# Patient Record
Sex: Female | Born: 1975
Health system: Southern US, Community
[De-identification: ages and names within clinical notes are randomized; demographics above are authoritative.]

## PROBLEM LIST (undated history)

## (undated) DIAGNOSIS — G43909 Migraine, unspecified, not intractable, without status migrainosus: Secondary | ICD-10-CM

## (undated) DIAGNOSIS — F329 Major depressive disorder, single episode, unspecified: Secondary | ICD-10-CM

## (undated) DIAGNOSIS — F419 Anxiety disorder, unspecified: Secondary | ICD-10-CM

## (undated) DIAGNOSIS — E119 Type 2 diabetes mellitus without complications: Secondary | ICD-10-CM

## (undated) DIAGNOSIS — R109 Unspecified abdominal pain: Secondary | ICD-10-CM

## (undated) DIAGNOSIS — E282 Polycystic ovarian syndrome: Secondary | ICD-10-CM

## (undated) DIAGNOSIS — A6920 Lyme disease, unspecified: Secondary | ICD-10-CM

## (undated) DIAGNOSIS — F32A Depression, unspecified: Secondary | ICD-10-CM

## (undated) HISTORY — DX: Lyme disease, unspecified: A69.20

## (undated) HISTORY — PX: HYSTERECTOMY ABDOMINAL WITH SALPINGECTOMY: SHX6725

## (undated) HISTORY — DX: Type 2 diabetes mellitus without complications: E11.9

## (undated) HISTORY — DX: Polycystic ovarian syndrome: E28.2

## (undated) HISTORY — PX: WISDOM TOOTH EXTRACTION: SHX21

## (undated) HISTORY — DX: Anxiety disorder, unspecified: F41.9

---

## 2011-07-18 ENCOUNTER — Emergency Department (HOSPITAL_COMMUNITY): Payer: BC Managed Care – PPO

## 2011-07-18 ENCOUNTER — Encounter (HOSPITAL_COMMUNITY): Payer: Self-pay | Admitting: *Deleted

## 2011-07-18 ENCOUNTER — Emergency Department (HOSPITAL_COMMUNITY)
Admission: EM | Admit: 2011-07-18 | Discharge: 2011-07-18 | Disposition: A | Discharge status: Home / Self Care | Payer: BC Managed Care – PPO | Attending: Emergency Medicine | Admitting: Emergency Medicine

## 2011-07-18 DIAGNOSIS — R0789 Other chest pain: Secondary | ICD-10-CM | POA: Insufficient documentation

## 2011-07-18 LAB — CBC
MCV: 82.2 fL (ref 78.0–100.0)
Platelets: 241 10*3/uL (ref 150–400)
RDW: 13.3 % (ref 11.5–15.5)
WBC: 8.3 10*3/uL (ref 4.0–10.5)

## 2011-07-18 LAB — COMPREHENSIVE METABOLIC PANEL
ALT: 11 U/L (ref 0–35)
AST: 17 U/L (ref 0–37)
Alkaline Phosphatase: 75 U/L (ref 39–117)
Calcium: 9.3 mg/dL (ref 8.4–10.5)
Potassium: 3.9 mEq/L (ref 3.5–5.1)
Sodium: 138 mEq/L (ref 135–145)
Total Protein: 7.4 g/dL (ref 6.0–8.3)

## 2011-07-18 LAB — DIFFERENTIAL
Basophils Absolute: 0 10*3/uL (ref 0.0–0.1)
Eosinophils Absolute: 0.2 10*3/uL (ref 0.0–0.7)
Eosinophils Relative: 3 % (ref 0–5)
Lymphocytes Relative: 23 % (ref 12–46)
Neutrophils Relative %: 70 % (ref 43–77)

## 2011-07-18 MED ORDER — KETOROLAC TROMETHAMINE 60 MG/2ML IM SOLN
60.0000 mg | Freq: Once | INTRAMUSCULAR | Status: AC
  Administered 2011-07-18: 60 mg via INTRAMUSCULAR
  Filled 2011-07-18: qty 2

## 2011-07-18 MED ORDER — OXYCODONE-ACETAMINOPHEN 5-325 MG PO TABS: 1.0000 | ORAL_TABLET | ORAL | Status: AC | PRN

## 2011-07-18 NOTE — ED Notes (Signed)
Lt sided chest pain with lt arm pain since yesterday.  No other symptoms

## 2011-07-18 NOTE — Discharge Instructions (Signed)
Continue taking NSAIDS every 6-8 hours.  Follow up with your providers as dicussed in the ED today and as written above.  See your doctor immediately--or return to the ED--with any new or troubling symptoms including fevers, weakness, new chest pain, shortness or breath, numbness, or any other concerning symptom.   Chest Pain (Nonspecific) It is often hard to give a specific diagnosis for the cause of chest pain. There is always a chance that your pain could be related to something serious, such as a heart attack or a blood clot in the lungs. You need to follow up with your caregiver for further evaluation. CAUSES   Heartburn.   Pneumonia or bronchitis.   Anxiety or stress.   Inflammation around your heart (pericarditis) or lung (pleuritis or pleurisy).   A blood clot in the lung.   A collapsed lung (pneumothorax). It can develop suddenly on its own (spontaneous pneumothorax) or from injury (trauma) to the chest.   Shingles infection (herpes zoster virus).  The chest wall is composed of bones, muscles, and cartilage. Any of these can be the source of the pain.  The bones can be bruised by injury.   The muscles or cartilage can be strained by coughing or overwork.   The cartilage can be affected by inflammation and become sore (costochondritis).  DIAGNOSIS  Lab tests or other studies, such as X-rays, electrocardiography, stress testing, or cardiac imaging, may be needed to find the cause of your pain.  TREATMENT   Treatment depends on what may be causing your chest pain. Treatment may include:   Acid blockers for heartburn.   Anti-inflammatory medicine.   Pain medicine for inflammatory conditions.   Antibiotics if an infection is present.   You may be advised to change lifestyle habits. This includes stopping smoking and avoiding alcohol, caffeine, and chocolate.   You may be advised to keep your head raised (elevated) when sleeping. This reduces the chance of acid going  backward from your stomach into your esophagus.   Most of the time, nonspecific chest pain will improve within 2 to 3 days with rest and mild pain medicine.  HOME CARE INSTRUCTIONS   If antibiotics were prescribed, take your antibiotics as directed. Finish them even if you start to feel better.   For the next few days, avoid physical activities that bring on chest pain. Continue physical activities as directed.   Do not smoke.   Avoid drinking alcohol.   Only take over-the-counter or prescription medicine for pain, discomfort, or fever as directed by your caregiver.   Follow your caregiver's suggestions for further testing if your chest pain does not go away.   Keep any follow-up appointments you made. If you do not go to an appointment, you could develop lasting (chronic) problems with pain. If there is any problem keeping an appointment, you must call to reschedule.  SEEK MEDICAL CARE IF:   You think you are having problems from the medicine you are taking. Read your medicine instructions carefully.   Your chest pain does not go away, even after treatment.   You develop a rash with blisters on your chest.  SEEK IMMEDIATE MEDICAL CARE IF:   You have increased chest pain or pain that spreads to your arm, neck, jaw, back, or abdomen.   You develop shortness of breath, an increasing cough, or you are coughing up blood.   You have severe back or abdominal pain, feel nauseous, or vomit.   You develop severe weakness, fainting,  or chills.   You have a fever.  THIS IS AN EMERGENCY. Do not wait to see if the pain will go away. Get medical help at once. Call your local emergency services (911 in [place] Do not drive yourself to the hospital. MAKE SURE YOU:   Understand these instructions.   Will watch your condition.   Will get help right away if you are not doing well or get worse.  Document Released: 11/07/2004 Document Revised: 01/17/2011 Document Reviewed: 09/03/2007 [facility]  Patient Information 2012 [place], [place].

## 2011-07-18 NOTE — ED Provider Notes (Signed)
History     CSN: [id number]  Arrival date & time 07/18/11  1706   First MD Initiated Contact with Patient 07/18/11 1802      Chief Complaint  Patient presents with  . Chest Pain    (Consider location/radiation/quality/duration/timing/severity/associated sxs/prior treatment) HPI  Patient is a 36 year old female with past medical history of depression on fluoxetine and also on Ortho-Cyclen presents today with a less than 24-hour history of sharp, worse with movment and deep inspiration, 2-10/10,  left-sided chest pain with some mild radiation to the shoulder without any other features concerning for ACS. Denies shortness of breath,  jaw pain, arm pain,  diaphoresis or dyspnea with exertion. She did endorse is a recent upper respiratory infection with right-sided sharp chest pain which was worse with deep inspiration and movement. Similarly, her complaint today is pain that is exacerbated by movement of her left shoulder with deep inspiration. She also does endorse tenderness to palpation of the left anterior chest. She denies any recent fever chills nausea vomiting diarrhea. She also denies any family HX of early ACS,  She takes no aspirin. Denies DVT, PE, clotting problems. On arrival patient's temperature 97.5 pulse 80, respirations 20, blood pressure 112/66, saturations are 100% on room air.   History reviewed. No pertinent past medical history.  History reviewed. No pertinent past surgical history.  No family history on file.  History  Substance Use Topics  . Smoking status: Current Everyday Smoker  . Smokeless tobacco: Not on file  . Alcohol Use: Yes    OB History    Grav Para Term Preterm Abortions TAB SAB Ect Mult Living                  Review of Systems Constitutional: Negative for fever and chills.  HENT: Negative for ear pain, sore throat and trouble swallowing.   Eyes: Negative for pain and visual disturbance.  Respiratory: Negative for cough and shortness of  breath.   Cardiovascular: POS for chest pain and neg leg swelling.  Gastrointestinal: Negative for nausea, vomiting, abdominal pain and diarrhea.  Genitourinary: Negative for dysuria, urgency and frequency.  Musculoskeletal: Negative for back pain and joint swelling.  Skin: Negative for rash and wound.  Neurological: Negative for dizziness, syncope, speech difficulty, weakness and numbness.     Allergies  Penicillins; Vicodin; and Sulfa antibiotics  Home Medications   Current Outpatient Rx  Name Route Sig Dispense Refill  . FLUOXETINE HCL 40 MG PO CAPS Oral Take 40 mg by mouth daily.    Marland Kitchen NORGESTIMATE-ETH ESTRADIOL 0.25-35 MG-MCG PO TABS Oral Take 1 tablet by mouth at bedtime.    . OXYCODONE-ACETAMINOPHEN 5-325 MG PO TABS Oral Take 1 tablet by mouth every 4 (four) hours as needed for pain. 5 tablet 0    BP 125/80  Pulse 92  Temp(Src) 98.6 F (37 C) (Oral)  Resp 18  SpO2 99%  LMP 06/17/2011  Physical Exam Consitutional: Pt in no acute distress.   Head: Normocephalic and atraumatic.  Eyes: Extraocular motion intact, no scleral icterus Neck: Supple without meningismus, mass, or overt JVD Respiratory: Effort normal and breath sounds normal. No respiratory distress. CV: Heart regular rate and rhythm, no obvious murmurs.  Pulses +2 and symmetric. TTP rib 3-4 midclavilaulr line.  Abdomen: Soft, non-tender, non-distended MSK: Extremities are atraumatic without deformity, ROM intact Skin: Warm, dry, intact Neuro: Alert and oriented, no motor deficit noted.  Psychiatric: Mood and affect are normal  CXR: NAD EKG: NSR. No STE, STD,  or TWI.      ED Course  Procedures (including critical care time)  Labs Reviewed  COMPREHENSIVE METABOLIC PANEL - Abnormal; Notable for the following:    Total Bilirubin 0.2 (*)    All other components within normal limits  CBC  DIFFERENTIAL  TROPONIN I   Dg Chest 2 View  07/18/2011  *RADIOLOGY REPORT*  Clinical Data: Chest pain.  History of  smoking.  CHEST - 2 VIEW  Comparison: No priors.  Findings: Lung volumes are normal.  No consolidative airspace disease.  No pleural effusions.  No pneumothorax.  No pulmonary nodule or mass noted.  Pulmonary vasculature and the cardiomediastinal silhouette are within normal limits.  IMPRESSION: 1. No radiographic evidence of acute cardiopulmonary disease.  Original Report Authenticated By: Florencia Reasons, M.D.     1. Muscular chest pain       MDM  Not c/w ACS.   Strong musculoskeletal story with pain on movement and TTP.  She has no family history of early ACS.  Takes no ASA, no risk factors other than smoking.  TIMI 0.   In terms of pulmonary embolus: risks are smoking and use of Ortho-Cyclen.   No lower leg swelling,  shortness of breath, DOE.  No HX DVT, clotting, PE.   EKG completed on arrival (normal), chest x-ray completed (normal).  Impression is  Muskel pain and pt can FU with PCP as needed.  Of note, screening trop (by triage) negative.    PT DC home stable with motrin and opioid.  Discussed with pt the clinical impression, treatment in the ED, and follow up plan.  We alslo discussed the indications for returning to the ED, which include shortness or breath, confusion, fever, new weakness or numbness, chest pain, or any other concerning symptom.  The pt understood the treatment and plan, is stable, and is able to leave the ED.    Jonetta Osgood MD 07/18/2011   9:40 PM            Larrie Kass, MD 07/18/11 2140

## 2011-07-18 NOTE — ED Provider Notes (Signed)
I saw and evaluated the patient, reviewed the resident's note and I agree with the findings and plan.   Loren Racer, MD 07/18/11 2147

## 2012-09-30 ENCOUNTER — Ambulatory Visit: Payer: BC Managed Care – PPO

## 2012-09-30 ENCOUNTER — Ambulatory Visit (INDEPENDENT_AMBULATORY_CARE_PROVIDER_SITE_OTHER): Payer: BC Managed Care – PPO | Admitting: Family Medicine

## 2012-09-30 DIAGNOSIS — IMO0002 Reserved for concepts with insufficient information to code with codable children: Secondary | ICD-10-CM

## 2012-09-30 DIAGNOSIS — R0789 Other chest pain: Secondary | ICD-10-CM

## 2012-09-30 DIAGNOSIS — R071 Chest pain on breathing: Secondary | ICD-10-CM

## 2012-09-30 DIAGNOSIS — S139XXA Sprain of joints and ligaments of unspecified parts of neck, initial encounter: Secondary | ICD-10-CM

## 2012-09-30 MED ORDER — DICLOFENAC SODIUM 75 MG PO TBEC: 75.0000 mg | DELAYED_RELEASE_TABLET | Freq: Two times a day (BID) | ORAL | Status: DC

## 2012-09-30 MED ORDER — HYDROCODONE-ACETAMINOPHEN 5-325 MG PO TABS: 1.0000 | ORAL_TABLET | Freq: Four times a day (QID) | ORAL | Status: DC | PRN
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
## 2012-11-11 DIAGNOSIS — R109 Unspecified abdominal pain: Secondary | ICD-10-CM

## 2012-11-11 HISTORY — DX: Unspecified abdominal pain: R10.9

## 2012-12-07 ENCOUNTER — Ambulatory Visit (INDEPENDENT_AMBULATORY_CARE_PROVIDER_SITE_OTHER): Payer: BC Managed Care – PPO | Admitting: Family Medicine

## 2012-12-07 VITALS — BP 112/84 | HR 80 | Temp 98.0°F | Resp 20 | Ht 66.5 in | Wt 219.2 lb

## 2012-12-07 DIAGNOSIS — R05 Cough: Secondary | ICD-10-CM

## 2012-12-07 DIAGNOSIS — J069 Acute upper respiratory infection, unspecified: Secondary | ICD-10-CM

## 2012-12-07 DIAGNOSIS — R059 Cough, unspecified: Secondary | ICD-10-CM

## 2012-12-07 MED ORDER — AZITHROMYCIN 250 MG PO TABS: ORAL_TABLET | ORAL | Status: DC

## 2012-12-07 MED ORDER — HYDROCODONE-HOMATROPINE 5-1.5 MG/5ML PO SYRP: 5.0000 mL | ORAL_SOLUTION | Freq: Three times a day (TID) | ORAL | Status: DC | PRN
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
## 2013-02-10 ENCOUNTER — Ambulatory Visit (INDEPENDENT_AMBULATORY_CARE_PROVIDER_SITE_OTHER): Payer: BC Managed Care – PPO | Admitting: Emergency Medicine

## 2013-02-10 VITALS — BP 118/64 | HR 90 | Temp 99.3°F | Resp 18 | Ht 67.0 in | Wt 218.0 lb

## 2013-02-10 DIAGNOSIS — R509 Fever, unspecified: Secondary | ICD-10-CM

## 2013-02-10 DIAGNOSIS — J029 Acute pharyngitis, unspecified: Secondary | ICD-10-CM

## 2013-02-10 LAB — POCT CBC
Lymph, poc: 1.6 (ref 0.6–3.4)
MCHC: 30.3 g/dL — AB (ref 31.8–35.4)
MID (cbc): 1.1 — AB (ref 0–0.9)
MPV: 9.4 fL (ref 0–99.8)
POC Granulocyte: 11.4 — AB (ref 2–6.9)
POC LYMPH PERCENT: 11 %L (ref 10–50)
POC MID %: 8 %M (ref 0–12)
Platelet Count, POC: 170 10*3/uL (ref 142–424)
RDW, POC: 15.8 %

## 2013-02-10 LAB — POCT INFLUENZA A/B: Influenza A, POC: NEGATIVE

## 2013-02-10 MED ORDER — CLINDAMYCIN HCL 300 MG PO CAPS: 300.0000 mg | ORAL_CAPSULE | Freq: Four times a day (QID) | ORAL | Status: DC

## 2013-02-10 MED ORDER — FIRST-DUKES MOUTHWASH MT SUSP: OROMUCOSAL | Status: DC

## 2013-02-10 NOTE — Progress Notes (Signed)
Subjective:    Patient ID: Wendy Maynard, female    DOB: [birth year], 37 y.o.   MRN: [id number]  HPI  Scribed for Lesle Chris MD, the patient was seen in room 5. This chart was scribed by Lewanda Rife, ED scribe. Patient's care was started at 4:17 PM  HPI Comments: Wendy Maynard is a 37 y.o. female who presents to the [facility] complaining of constant worsening sore throat onset 6 days. Reports associated mild headache, and generalized myalgias. Reports symptoms are exacerbated by swallowing and alleviated by nothing. Denies associated dysphagia, and cough.   Possible sick contacts over Christmas. Reports having a flu shot this year.    Past Medical History  Diagnosis Date  . Anxiety   . Diabetes mellitus without complication   . Polycystic ovarian disease     History reviewed. No pertinent past surgical history.  History reviewed. No pertinent family history.  History   Social History  . Marital Status: Single    Spouse Name: N/A    Number of Children: N/A  . Years of Education: N/A   Occupational History  . Not on file.   Social History Main Topics  . Smoking status: Current Every Day Smoker -- 0.50 packs/day for 8 years    Types: Cigarettes  . Smokeless tobacco: Not on file  . Alcohol Use: Yes  . Drug Use: No  . Sexual Activity: No   Other Topics Concern  . Not on file   Social History Narrative  . No narrative on file    Allergies  Allergen Reactions  . Penicillins   . Vicodin [Hydrocodone-Acetaminophen] Itching and Nausea And Vomiting  . Sulfa Antibiotics Rash    There are no active problems to display for this patient.     Review of Systems  Constitutional: Negative for fever.  HENT: Positive for sore throat.   Musculoskeletal: Positive for myalgias.  Neurological: Positive for headaches.       Objective:   Physical Exam Physical Exam  Nursing note and vitals reviewed. Constitutional: She is oriented to person, place,  and time. She appears well-developed and well-nourished. No distress.  HENT:  Head: Normocephalic and atraumatic.  Throat: Oropharynx is erythematous. No signs of peritonsillar or tonsillar abscess. Bilateral tonsillar ulcerations present without drainage. Oropharynx is without exudates. Uvula is midline.  Airway is intact.  Eyes: EOM are normal.  Neck: Neck supple. No tracheal deviation present. Cervical lymphadenopathy. Cardiovascular: Normal rate.   Pulmonary/Chest: Effort normal. No respiratory distress. Lung fields are clear. No rales, rhonchi, and wheeze noted.  Musculoskeletal: Normal range of motion.  Neurological: She is alert and oriented to person, place, and time.  Skin: Skin is warm and dry.  Psychiatric: She has a normal mood and affect. Her behavior is normal.   COORDINATION OF CARE:  Nursing notes reviewed. Vital signs reviewed. Initial pt interview and examination performed.   4:18 PM-Discussed work up plan with pt at bedside, which includes CBC with diff panel, flu swab, and rapid strep screen. Pt agrees with plan.   Treatment plan initiated:Medications - No data to display   Initial diagnostic testing ordered.   Results for orders placed in visit on 02/10/13  POCT CBC      Result Value Range   WBC 14.1 (*) 4.6 - 10.2 K/uL   Lymph, poc 1.6  0.6 - 3.4   POC LYMPH PERCENT 11.0  10 - 50 %L   MID (cbc) 1.1 (*) 0 - 0.9  POC MID % 8.0  0 - 12 %M   POC Granulocyte 11.4 (*) 2 - 6.9   Granulocyte percent 81.0 (*) 37 - 80 %G   RBC 4.75  4.04 - 5.48 M/uL   Hemoglobin 12.8  12.2 - 16.2 g/dL   HCT, POC 16.1  09.6 - 47.9 %   MCV 89.0  80 - 97 fL   MCH, POC 26.9 (*) 27 - 31.2 pg   MCHC 30.3 (*) 31.8 - 35.4 g/dL   RDW, POC 04.5     Platelet Count, POC 170  142 - 424 K/uL   MPV 9.4  0 - 99.8 fL  POCT INFLUENZA A/B      Result Value Range   Influenza A, POC Negative     Influenza B, POC Negative    POCT RAPID STREP A (OFFICE)      Result Value Range   Rapid Strep A  Screen Negative  Negative         Assessment & Plan:  We'll treat with mouthwash along with Cleocin 300 mg. 4 times a day recheck on Friday she is to force fluids   I personally performed the services described in this documentation, which was scribed in my presence. The recorded information has been reviewed and is accurate.

## 2013-02-10 NOTE — Patient Instructions (Signed)
Sore Throat A sore throat is pain, burning, irritation, or scratchiness of the throat. There is often pain or tenderness when swallowing or talking. A sore throat may be accompanied by other symptoms, such as coughing, sneezing, fever, and swollen neck glands. A sore throat is often the first sign of another sickness, such as a cold, flu, strep throat, or mononucleosis (commonly known as mono). Most sore throats go away without medical treatment. CAUSES  The most common causes of a sore throat include:  A viral infection, such as a cold, flu, or mono.  A bacterial infection, such as strep throat, tonsillitis, or whooping cough.  Seasonal allergies.  Dryness in the air.  Irritants, such as smoke or pollution.  Gastroesophageal reflux disease (GERD). HOME CARE INSTRUCTIONS   Only take over-the-counter medicines as directed by your caregiver.  Drink enough fluids to keep your urine clear or pale yellow.  Rest as needed.  Try using throat sprays, lozenges, or sucking on hard candy to ease any pain (if older than 4 years or as directed).  Sip warm liquids, such as broth, herbal tea, or warm water with honey to relieve pain temporarily. You may also eat or drink cold or frozen liquids such as frozen ice pops.  Gargle with salt water (mix 1 tsp salt with 8 oz of water).  Do not smoke and avoid secondhand smoke.  Put a cool-mist humidifier in your bedroom at night to moisten the air. You can also turn on a hot shower and sit in the bathroom with the door closed for 5 10 minutes. SEEK IMMEDIATE MEDICAL CARE IF:  You have difficulty breathing.  You are unable to swallow fluids, soft foods, or your saliva.  You have increased swelling in the throat.  Your sore throat does not get better in 7 days.  You have nausea and vomiting.  You have a fever or persistent symptoms for more than 2 3 days.  You have a fever and your symptoms suddenly get worse. MAKE SURE YOU:   Understand  these instructions.  Will watch your condition.  Will get help right away if you are not doing well or get worse. Document Released: 03/07/2004 Document Revised: 01/15/2012 Document Reviewed: 10/06/2011 Exit[employer] Patient Information 2014 [employer].  

## 2013-02-12 LAB — CULTURE, GROUP A STREP

## 2013-02-13 ENCOUNTER — Telehealth: Payer: Self-pay

## 2013-02-13 NOTE — Telephone Encounter (Signed)
PATIENT STATES SHE SAW DR. DAUB ON Wednesday FOR A SORE THROAT. HE PRESCRIBED HER CLINDAMYCIN 300 MG WHICH IS REALLY HELPING, BUT IT IS NOT AGREEING WITH HER STOMACH. SHE IS HAVING DIARRHEA AND VOMITING. WHAT SHOULD SHE DO NEXT? BEST PHONE [phone] (CELL)   PHARMACY CHOICE IS [facility] ON [address].  MBC

## 2013-02-26 ENCOUNTER — Emergency Department (HOSPITAL_COMMUNITY): Payer: BC Managed Care – PPO

## 2013-02-26 ENCOUNTER — Encounter (HOSPITAL_COMMUNITY): Payer: BC Managed Care – PPO | Admitting: Registered Nurse

## 2013-02-26 ENCOUNTER — Inpatient Hospital Stay (HOSPITAL_COMMUNITY)
Admission: EM | Admit: 2013-02-26 | Discharge: 2013-02-27 | DRG: 419 | Disposition: A | Discharge status: Home / Self Care | Payer: BC Managed Care – PPO | Attending: Surgery | Admitting: Surgery

## 2013-02-26 ENCOUNTER — Encounter (HOSPITAL_COMMUNITY): Payer: Self-pay | Admitting: Emergency Medicine

## 2013-02-26 ENCOUNTER — Inpatient Hospital Stay (HOSPITAL_COMMUNITY): Payer: BC Managed Care – PPO | Admitting: Registered Nurse

## 2013-02-26 ENCOUNTER — Inpatient Hospital Stay (HOSPITAL_COMMUNITY): Payer: BC Managed Care – PPO

## 2013-02-26 ENCOUNTER — Encounter (HOSPITAL_COMMUNITY): Admission: EM | Disposition: A | Discharge status: Home / Self Care | Payer: Self-pay | Source: Home / Self Care

## 2013-02-26 DIAGNOSIS — R197 Diarrhea, unspecified: Secondary | ICD-10-CM | POA: Diagnosis present

## 2013-02-26 DIAGNOSIS — T368X5A Adverse effect of other systemic antibiotics, initial encounter: Secondary | ICD-10-CM | POA: Diagnosis not present

## 2013-02-26 DIAGNOSIS — K8010 Calculus of gallbladder with chronic cholecystitis without obstruction: Secondary | ICD-10-CM

## 2013-02-26 DIAGNOSIS — K819 Cholecystitis, unspecified: Secondary | ICD-10-CM | POA: Diagnosis present

## 2013-02-26 DIAGNOSIS — F17200 Nicotine dependence, unspecified, uncomplicated: Secondary | ICD-10-CM | POA: Diagnosis present

## 2013-02-26 DIAGNOSIS — E669 Obesity, unspecified: Secondary | ICD-10-CM | POA: Diagnosis present

## 2013-02-26 DIAGNOSIS — Z6832 Body mass index (BMI) 32.0-32.9, adult: Secondary | ICD-10-CM

## 2013-02-26 DIAGNOSIS — R112 Nausea with vomiting, unspecified: Secondary | ICD-10-CM

## 2013-02-26 DIAGNOSIS — E119 Type 2 diabetes mellitus without complications: Secondary | ICD-10-CM | POA: Diagnosis present

## 2013-02-26 DIAGNOSIS — K8020 Calculus of gallbladder without cholecystitis without obstruction: Secondary | ICD-10-CM

## 2013-02-26 DIAGNOSIS — L500 Allergic urticaria: Secondary | ICD-10-CM | POA: Diagnosis not present

## 2013-02-26 DIAGNOSIS — R109 Unspecified abdominal pain: Secondary | ICD-10-CM

## 2013-02-26 HISTORY — DX: Major depressive disorder, single episode, unspecified: F32.9

## 2013-02-26 HISTORY — DX: Unspecified abdominal pain: R10.9

## 2013-02-26 HISTORY — PX: CHOLECYSTECTOMY: SHX55

## 2013-02-26 HISTORY — DX: Migraine, unspecified, not intractable, without status migrainosus: G43.909

## 2013-02-26 HISTORY — DX: Depression, unspecified: F32.A

## 2013-02-26 LAB — COMPREHENSIVE METABOLIC PANEL
ALT: 26 U/L (ref 0–35)
AST: 51 U/L — AB (ref 0–37)
Albumin: 3.6 g/dL (ref 3.5–5.2)
Alkaline Phosphatase: 113 U/L (ref 39–117)
BUN: 14 mg/dL (ref 6–23)
CALCIUM: 8.9 mg/dL (ref 8.4–10.5)
CO2: 22 meq/L (ref 19–32)
CREATININE: 0.7 mg/dL (ref 0.50–1.10)
Chloride: 104 mEq/L (ref 96–112)
Glucose, Bld: 113 mg/dL — ABNORMAL HIGH (ref 70–99)
Potassium: 3.9 mEq/L (ref 3.7–5.3)
Sodium: 143 mEq/L (ref 137–147)
Total Bilirubin: 0.3 mg/dL (ref 0.3–1.2)
Total Protein: 7.5 g/dL (ref 6.0–8.3)

## 2013-02-26 LAB — SURGICAL PCR SCREEN
MRSA, PCR: NEGATIVE
Staphylococcus aureus: NEGATIVE

## 2013-02-26 LAB — URINALYSIS, ROUTINE W REFLEX MICROSCOPIC
Bilirubin Urine: NEGATIVE
GLUCOSE, UA: NEGATIVE mg/dL
Hgb urine dipstick: NEGATIVE
Ketones, ur: NEGATIVE mg/dL
LEUKOCYTES UA: NEGATIVE
NITRITE: NEGATIVE
PROTEIN: NEGATIVE mg/dL
Specific Gravity, Urine: 1.017 (ref 1.005–1.030)
UROBILINOGEN UA: 0.2 mg/dL (ref 0.0–1.0)
pH: 6 (ref 5.0–8.0)

## 2013-02-26 LAB — CBC WITH DIFFERENTIAL/PLATELET
BASOS ABS: 0 10*3/uL (ref 0.0–0.1)
Basophils Relative: 0 % (ref 0–1)
EOS PCT: 1 % (ref 0–5)
Eosinophils Absolute: 0.2 10*3/uL (ref 0.0–0.7)
HCT: 38.4 % (ref 36.0–46.0)
HEMOGLOBIN: 12.5 g/dL (ref 12.0–15.0)
LYMPHS PCT: 13 % (ref 12–46)
Lymphs Abs: 2.1 10*3/uL (ref 0.7–4.0)
MCH: 26.7 pg (ref 26.0–34.0)
MCHC: 32.6 g/dL (ref 30.0–36.0)
MCV: 82.1 fL (ref 78.0–100.0)
MONO ABS: 0.6 10*3/uL (ref 0.1–1.0)
Monocytes Relative: 4 % (ref 3–12)
Neutro Abs: 13.4 10*3/uL — ABNORMAL HIGH (ref 1.7–7.7)
Neutrophils Relative %: 82 % — ABNORMAL HIGH (ref 43–77)
Platelets: 274 10*3/uL (ref 150–400)
RBC: 4.68 MIL/uL (ref 3.87–5.11)
RDW: 14.2 % (ref 11.5–15.5)
WBC: 16.4 10*3/uL — ABNORMAL HIGH (ref 4.0–10.5)

## 2013-02-26 LAB — LIPASE, BLOOD: Lipase: 49 U/L (ref 11–59)

## 2013-02-26 LAB — GLUCOSE, CAPILLARY: Glucose-Capillary: 75 mg/dL (ref 70–99)

## 2013-02-26 LAB — POCT PREGNANCY, URINE: PREG TEST UR: NEGATIVE

## 2013-02-26 SURGERY — LAPAROSCOPIC CHOLECYSTECTOMY WITH INTRAOPERATIVE CHOLANGIOGRAM
Anesthesia: General | Site: Abdomen

## 2013-02-26 MED ORDER — ROCURONIUM BROMIDE 100 MG/10ML IV SOLN
INTRAVENOUS | Status: AC
  Filled 2013-02-26: qty 1

## 2013-02-26 MED ORDER — DULOXETINE HCL 60 MG PO CPEP
60.0000 mg | ORAL_CAPSULE | Freq: Every day | ORAL | Status: DC
  Administered 2013-02-27: 60 mg via ORAL
  Filled 2013-02-26 (×2): qty 1

## 2013-02-26 MED ORDER — PANTOPRAZOLE SODIUM 40 MG PO TBEC
80.0000 mg | DELAYED_RELEASE_TABLET | Freq: Every day | ORAL | Status: DC
  Administered 2013-02-27: 80 mg via ORAL
  Filled 2013-02-26: qty 2

## 2013-02-26 MED ORDER — DEXTROSE 5 % IV SOLN
2.0000 g | Freq: Once | INTRAVENOUS | Status: AC
  Administered 2013-02-26: 2 g via INTRAVENOUS
  Filled 2013-02-26: qty 2

## 2013-02-26 MED ORDER — OXYCODONE HCL 5 MG PO TABS
5.0000 mg | ORAL_TABLET | ORAL | Status: DC | PRN
  Administered 2013-02-26: 5 mg via ORAL
  Administered 2013-02-26 – 2013-02-27 (×3): 10 mg via ORAL
  Filled 2013-02-26: qty 2
  Filled 2013-02-26: qty 1
  Filled 2013-02-26 (×2): qty 2

## 2013-02-26 MED ORDER — 0.9 % SODIUM CHLORIDE (POUR BTL) OPTIME
TOPICAL | Status: DC | PRN
  Administered 2013-02-26: 1000 mL

## 2013-02-26 MED ORDER — DIPHENHYDRAMINE HCL 50 MG/ML IJ SOLN
25.0000 mg | Freq: Once | INTRAMUSCULAR | Status: DC
  Filled 2013-02-26: qty 1

## 2013-02-26 MED ORDER — MORPHINE SULFATE 4 MG/ML IJ SOLN
4.0000 mg | Freq: Once | INTRAMUSCULAR | Status: AC
  Administered 2013-02-26: 4 mg via INTRAVENOUS
  Filled 2013-02-26: qty 1

## 2013-02-26 MED ORDER — LIDOCAINE HCL (CARDIAC) 20 MG/ML IV SOLN
INTRAVENOUS | Status: DC | PRN
  Administered 2013-02-26: 50 mg via INTRAVENOUS
  Administered 2013-02-26: 25 mg via INTRATRACHEAL

## 2013-02-26 MED ORDER — SUFENTANIL CITRATE 50 MCG/ML IV SOLN
INTRAVENOUS | Status: DC | PRN
  Administered 2013-02-26: 10 ug via INTRAVENOUS
  Administered 2013-02-26: 5 ug via INTRAVENOUS
  Administered 2013-02-26: 15 ug via INTRAVENOUS
  Administered 2013-02-26 (×2): 10 ug via INTRAVENOUS

## 2013-02-26 MED ORDER — DEXAMETHASONE SODIUM PHOSPHATE 10 MG/ML IJ SOLN
INTRAMUSCULAR | Status: AC
  Filled 2013-02-26: qty 1

## 2013-02-26 MED ORDER — ZOLPIDEM TARTRATE 5 MG PO TABS
5.0000 mg | ORAL_TABLET | Freq: Every evening | ORAL | Status: DC | PRN
  Administered 2013-02-26: 5 mg via ORAL
  Filled 2013-02-26: qty 1

## 2013-02-26 MED ORDER — KCL IN DEXTROSE-NACL 20-5-0.45 MEQ/L-%-% IV SOLN
INTRAVENOUS | Status: DC
  Administered 2013-02-26 – 2013-02-27 (×2): via INTRAVENOUS
  Filled 2013-02-26 (×4): qty 1000

## 2013-02-26 MED ORDER — ALPRAZOLAM 0.5 MG PO TABS: 0.5000 mg | ORAL_TABLET | Freq: Three times a day (TID) | ORAL | Status: DC | PRN

## 2013-02-26 MED ORDER — PNEUMOCOCCAL VAC POLYVALENT 25 MCG/0.5ML IJ INJ
0.5000 mL | INJECTION | INTRAMUSCULAR | Status: DC
  Filled 2013-02-26 (×2): qty 0.5

## 2013-02-26 MED ORDER — OXYCODONE HCL 5 MG PO TABS: 5.0000 mg | ORAL_TABLET | ORAL | Status: DC | PRN

## 2013-02-26 MED ORDER — METOCLOPRAMIDE HCL 5 MG/ML IJ SOLN
10.0000 mg | Freq: Once | INTRAMUSCULAR | Status: AC
  Administered 2013-02-26: 10 mg via INTRAVENOUS
  Filled 2013-02-26: qty 2

## 2013-02-26 MED ORDER — GLYCOPYRROLATE 0.2 MG/ML IJ SOLN
INTRAMUSCULAR | Status: DC | PRN
  Administered 2013-02-26: 0.4 mg via INTRAVENOUS

## 2013-02-26 MED ORDER — ENOXAPARIN SODIUM 40 MG/0.4ML ~~LOC~~ SOLN
40.0000 mg | SUBCUTANEOUS | Status: DC
  Filled 2013-02-26 (×2): qty 0.4

## 2013-02-26 MED ORDER — DIPHENHYDRAMINE HCL 50 MG/ML IJ SOLN
25.0000 mg | INTRAMUSCULAR | Status: AC
  Administered 2013-02-26: 25 mg via INTRAVENOUS

## 2013-02-26 MED ORDER — PROMETHAZINE HCL 25 MG/ML IJ SOLN
INTRAMUSCULAR | Status: AC
  Filled 2013-02-26: qty 1

## 2013-02-26 MED ORDER — LACTATED RINGERS IV SOLN
INTRAVENOUS | Status: DC | PRN
  Administered 2013-02-26 (×2): via INTRAVENOUS

## 2013-02-26 MED ORDER — ROCURONIUM BROMIDE 100 MG/10ML IV SOLN
INTRAVENOUS | Status: DC | PRN
  Administered 2013-02-26: 10 mg via INTRAVENOUS
  Administered 2013-02-26: 5 mg via INTRAVENOUS
  Administered 2013-02-26: 35 mg via INTRAVENOUS

## 2013-02-26 MED ORDER — MORPHINE SULFATE 2 MG/ML IJ SOLN
2.0000 mg | INTRAMUSCULAR | Status: DC | PRN
  Administered 2013-02-26 – 2013-02-27 (×6): 2 mg via INTRAVENOUS
  Filled 2013-02-26 (×6): qty 1

## 2013-02-26 MED ORDER — LIDOCAINE HCL (CARDIAC) 20 MG/ML IV SOLN
INTRAVENOUS | Status: AC
  Filled 2013-02-26: qty 5

## 2013-02-26 MED ORDER — ONDANSETRON HCL 4 MG/2ML IJ SOLN
INTRAMUSCULAR | Status: AC
  Filled 2013-02-26: qty 2

## 2013-02-26 MED ORDER — IOHEXOL 300 MG/ML  SOLN
50.0000 mL | Freq: Once | INTRAMUSCULAR | Status: AC | PRN
  Administered 2013-02-26: 50 mL via ORAL

## 2013-02-26 MED ORDER — CIPROFLOXACIN IN D5W 400 MG/200ML IV SOLN
400.0000 mg | Freq: Two times a day (BID) | INTRAVENOUS | Status: DC
  Administered 2013-02-26: 400 mg via INTRAVENOUS
  Filled 2013-02-26 (×2): qty 200

## 2013-02-26 MED ORDER — DEXAMETHASONE SODIUM PHOSPHATE 10 MG/ML IJ SOLN
INTRAMUSCULAR | Status: DC | PRN
  Administered 2013-02-26: 10 mg via INTRAVENOUS

## 2013-02-26 MED ORDER — MIDAZOLAM HCL 5 MG/5ML IJ SOLN
INTRAMUSCULAR | Status: DC | PRN
  Administered 2013-02-26 (×2): 1 mg via INTRAVENOUS

## 2013-02-26 MED ORDER — ONDANSETRON HCL 4 MG/2ML IJ SOLN: 4.0000 mg | Freq: Four times a day (QID) | INTRAMUSCULAR | Status: DC | PRN

## 2013-02-26 MED ORDER — MIDAZOLAM HCL 2 MG/2ML IJ SOLN
INTRAMUSCULAR | Status: AC
  Filled 2013-02-26: qty 2

## 2013-02-26 MED ORDER — DEXTROSE 5 % IV SOLN
1.0000 g | Freq: Once | INTRAVENOUS | Status: DC
  Filled 2013-02-26: qty 10

## 2013-02-26 MED ORDER — IOHEXOL 300 MG/ML  SOLN
INTRAMUSCULAR | Status: DC | PRN
  Administered 2013-02-26: 10 mL

## 2013-02-26 MED ORDER — SODIUM CHLORIDE 0.9 % IJ SOLN
INTRAMUSCULAR | Status: AC
  Filled 2013-02-26: qty 10

## 2013-02-26 MED ORDER — LACTATED RINGERS IV SOLN: INTRAVENOUS | Status: DC

## 2013-02-26 MED ORDER — PROMETHAZINE HCL 25 MG/ML IJ SOLN
6.2500 mg | INTRAMUSCULAR | Status: DC | PRN
  Administered 2013-02-26: 6.25 mg via INTRAVENOUS

## 2013-02-26 MED ORDER — FENTANYL CITRATE 0.05 MG/ML IJ SOLN: 25.0000 ug | INTRAMUSCULAR | Status: DC | PRN

## 2013-02-26 MED ORDER — GLYCOPYRROLATE 0.2 MG/ML IJ SOLN
INTRAMUSCULAR | Status: AC
  Filled 2013-02-26: qty 2

## 2013-02-26 MED ORDER — LACTATED RINGERS IR SOLN
Status: DC | PRN
  Administered 2013-02-26: 1000 mL

## 2013-02-26 MED ORDER — IOHEXOL 300 MG/ML  SOLN
100.0000 mL | Freq: Once | INTRAMUSCULAR | Status: AC | PRN
  Administered 2013-02-26: 100 mL via INTRAVENOUS

## 2013-02-26 MED ORDER — NEOSTIGMINE METHYLSULFATE 1 MG/ML IJ SOLN
INTRAMUSCULAR | Status: DC | PRN
  Administered 2013-02-26: 3 mg via INTRAVENOUS

## 2013-02-26 MED ORDER — BUPIVACAINE HCL 0.5 % IJ SOLN
INTRAMUSCULAR | Status: DC | PRN
  Administered 2013-02-26: 30 mL

## 2013-02-26 MED ORDER — NEOSTIGMINE METHYLSULFATE 1 MG/ML IJ SOLN
INTRAMUSCULAR | Status: AC
  Filled 2013-02-26: qty 10

## 2013-02-26 MED ORDER — SODIUM CHLORIDE 0.9 % IV BOLUS (SEPSIS)
1000.0000 mL | Freq: Once | INTRAVENOUS | Status: AC
  Administered 2013-02-26: 1000 mL via INTRAVENOUS

## 2013-02-26 MED ORDER — PROPOFOL 10 MG/ML IV BOLUS
INTRAVENOUS | Status: AC
  Filled 2013-02-26: qty 20

## 2013-02-26 MED ORDER — SUFENTANIL CITRATE 50 MCG/ML IV SOLN
INTRAVENOUS | Status: AC
  Filled 2013-02-26: qty 1

## 2013-02-26 MED ORDER — PROPOFOL 10 MG/ML IV BOLUS
INTRAVENOUS | Status: DC | PRN
  Administered 2013-02-26: 200 mg via INTRAVENOUS

## 2013-02-26 MED ORDER — ONDANSETRON HCL 4 MG/2ML IJ SOLN
INTRAMUSCULAR | Status: DC | PRN
  Administered 2013-02-26: 4 mg via INTRAVENOUS

## 2013-02-26 MED ORDER — SUCCINYLCHOLINE CHLORIDE 20 MG/ML IJ SOLN
INTRAMUSCULAR | Status: AC
  Filled 2013-02-26: qty 1

## 2013-02-26 MED ORDER — MEPERIDINE HCL 50 MG/ML IJ SOLN: 6.2500 mg | INTRAMUSCULAR | Status: DC | PRN

## 2013-02-26 MED ORDER — MORPHINE SULFATE 4 MG/ML IJ SOLN
INTRAMUSCULAR | Status: AC
  Administered 2013-02-26: 4 mg via INTRAVENOUS
  Filled 2013-02-26: qty 1

## 2013-02-26 MED ORDER — BUPIVACAINE HCL (PF) 0.5 % IJ SOLN
INTRAMUSCULAR | Status: AC
  Filled 2013-02-26: qty 30

## 2013-02-26 MED ORDER — SUCCINYLCHOLINE CHLORIDE 20 MG/ML IJ SOLN
INTRAMUSCULAR | Status: DC | PRN
  Administered 2013-02-26: 100 mg via INTRAVENOUS

## 2013-02-26 SURGICAL SUPPLY — 36 items
APPLIER CLIP 5 13 M/L LIGAMAX5 (MISCELLANEOUS) ×2
BENZOIN TINCTURE PRP APPL 2/3 (GAUZE/BANDAGES/DRESSINGS) ×2 IMPLANT
CHLORAPREP W/TINT 26ML (MISCELLANEOUS) ×2 IMPLANT
CLIP APPLIE 5 13 M/L LIGAMAX5 (MISCELLANEOUS) ×1 IMPLANT
COVER MAYO STAND STRL (DRAPES) ×2 IMPLANT
DECANTER SPIKE VIAL GLASS SM (MISCELLANEOUS) ×2 IMPLANT
DRAPE C-ARM 42X120 X-RAY (DRAPES) ×2 IMPLANT
DRAPE LAPAROSCOPIC ABDOMINAL (DRAPES) ×2 IMPLANT
DRAPE UTILITY XL STRL (DRAPES) ×2 IMPLANT
DRSG TEGADERM 2-3/8X2-3/4 SM (GAUZE/BANDAGES/DRESSINGS) ×6 IMPLANT
ELECT REM PT RETURN 9FT ADLT (ELECTROSURGICAL) ×2
ELECTRODE REM PT RTRN 9FT ADLT (ELECTROSURGICAL) ×1 IMPLANT
ENDOLOOP SUT PDS II  0 18 (SUTURE)
ENDOLOOP SUT PDS II 0 18 (SUTURE) IMPLANT
GAUZE SPONGE 2X2 8PLY STRL LF (GAUZE/BANDAGES/DRESSINGS) ×1 IMPLANT
GLOVE ECLIPSE 8.0 STRL XLNG CF (GLOVE) ×2 IMPLANT
GLOVE INDICATOR 8.0 STRL GRN (GLOVE) ×2 IMPLANT
GOWN STRL REUS W/TWL XL LVL3 (GOWN DISPOSABLE) ×6 IMPLANT
HEMOSTAT SNOW SURGICEL 2X4 (HEMOSTASIS) IMPLANT
KIT BASIN OR (CUSTOM PROCEDURE TRAY) ×2 IMPLANT
POUCH SPECIMEN RETRIEVAL 10MM (ENDOMECHANICALS) ×2 IMPLANT
SCISSORS LAP 5X35 DISP (ENDOMECHANICALS) ×2 IMPLANT
SET CHOLANGIOGRAPH MIX (MISCELLANEOUS) ×2 IMPLANT
SET IRRIG TUBING LAPAROSCOPIC (IRRIGATION / IRRIGATOR) ×2 IMPLANT
SLEEVE XCEL OPT CAN 5 100 (ENDOMECHANICALS) ×4 IMPLANT
SOLUTION ANTI FOG 6CC (MISCELLANEOUS) ×2 IMPLANT
SPONGE GAUZE 2X2 STER 10/PKG (GAUZE/BANDAGES/DRESSINGS) ×1
STRIP CLOSURE SKIN 1/2X4 (GAUZE/BANDAGES/DRESSINGS) ×2 IMPLANT
SUT MNCRL AB 4-0 PS2 18 (SUTURE) ×2 IMPLANT
TOWEL OR 17X26 10 PK STRL BLUE (TOWEL DISPOSABLE) ×2 IMPLANT
TOWEL OR NON WOVEN STRL DISP B (DISPOSABLE) ×2 IMPLANT
TRAY LAP CHOLE (CUSTOM PROCEDURE TRAY) ×2 IMPLANT
TROCAR BLADELESS OPT 5 100 (ENDOMECHANICALS) ×2 IMPLANT
TROCAR XCEL BLUNT TIP 100MML (ENDOMECHANICALS) ×2 IMPLANT
TROCAR XCEL NON-BLD 11X100MML (ENDOMECHANICALS) IMPLANT
TUBING INSUFFLATION 10FT LAP (TUBING) ×2 IMPLANT

## 2013-02-26 NOTE — ED Notes (Signed)
Patient reports ongoing abd problems since October, with diarrhea, nausea and vomiting. Patient reports recently finishing Clindamycin which intensified the symptoms. Patient states that she was started on Nexium and Carafate for the symptoms. Patient reports pain intensified this evening around midnight, patient states she has had two episodes of emesis this evening. Patient in NAD at this time.

## 2013-02-26 NOTE — ED Provider Notes (Signed)
Medical screening examination/treatment/procedure(s) were performed by non-physician practitioner and as supervising physician I was immediately available for consultation/collaboration.  EKG Interpretation   None         Manasseh Pittsley, MD 02/26/13 0630 

## 2013-02-26 NOTE — Progress Notes (Signed)
Findings at surgery, postop care, discharge instructions discussed with her and her family.

## 2013-02-26 NOTE — Discharge Instructions (Signed)
[facility] ______[facility] LAPAROSCOPIC SURGERY: POST OP INSTRUCTIONS Always review your discharge instruction sheet given to you by the facility where your surgery was performed. IF YOU HAVE DISABILITY OR FAMILY LEAVE FORMS, YOU MUST BRING THEM TO THE OFFICE FOR PROCESSING.   DO NOT GIVE THEM TO YOUR DOCTOR.  1. A prescription for pain medication may be given to you upon discharge.  Take your pain medication as prescribed, if needed.  If narcotic pain medicine is not needed, then you may take acetaminophen (Tylenol) or ibuprofen (Advil) as needed. 2. Take your usually prescribed medications unless otherwise directed. 3. If you need a refill on your pain medication, please contact your pharmacy.  They will contact our office to request authorization. Prescriptions will not be filled after 5pm or on week-ends. 4. You should follow a light diet the first few days after arrival home, such as soup and crackers, etc.  Be sure to include lots of fluids daily. 5. Most patients will experience some swelling and bruising in the area of the incisions.  Ice packs will help.  Swelling and bruising can take several days to resolve.  6. It is common to experience some constipation if taking pain medication after surgery.  Increasing fluid intake and taking a stool softener (such as Colace) will usually help or prevent this problem from occurring.  A mild laxative (Milk of Magnesia or Miralax) should be taken according to package instructions if there are no bowel movements after 48 hours. 7. Unless discharge instructions indicate otherwise, you may remove your bandages 72 hours after surgery, and you may shower at that time.  You may have steri-strips (small skin tapes) in place directly over the incision.  These strips should be left on the skin for 14 days.  If your surgeon used skin glue on the incision, you may shower in 24 hours.  The glue will flake off over the next 2-3 weeks.  Any sutures or  staples will be removed at the office during your follow-up visit. 8. ACTIVITIES:  You may resume regular (light) daily activities beginning the next day--such as daily self-care, walking, climbing stairs--gradually increasing activities as tolerated.  You may have sexual intercourse when it is comfortable.  Refrain from any heavy lifting or straining-nothing over 10 pounds for 2 weeks. a. You may drive when you are no longer taking prescription pain medication, you can comfortably wear a seatbelt, and you can safely maneuver your car and apply brakes. b. RETURN TO WORK:  __Desk work in 1-2 weeks, full duty after 2 weeks.________________________________________________________ 9. You should see your doctor in the office for a follow-up appointment approximately 2-3 weeks after your surgery.  Make sure that you call for this appointment within a day or two after you arrive home to insure a convenient appointment time. 10. OTHER INSTRUCTIONS: __________________________________________________________________________________________________________________________ __________________________________________________________________________________________________________________________ WHEN TO CALL YOUR DOCTOR: 1. Fever over 101.0 2. Inability to urinate 3. Continued bleeding from incision. 4. Increased pain, redness, or drainage from the incision. 5. Increasing abdominal pain  The clinic staff is available to answer your questions during regular business hours.  Please dont hesitate to call and ask to speak to one of the nurses for clinical concerns.  If you have a medical emergency, go to the nearest emergency room or call 911.  [facility] from [facility] is always on call at the hospital. [address], [address], [place], [place]  [id number] ? [address] [address], [place], [place]   [id number] (  [phone] ? [phone] ? FAX [phone] Web site: [email or web address]

## 2013-02-26 NOTE — Progress Notes (Signed)
Patient  complained of right shoulder pain. Patient walked approximately 350 feet with  Mother at side.  RN educated patient that post operative "gas pain" is expected and will subside usually 1-2 days post operative. Patient requested pain analgesia.  RN administered IV pain medicine.  RN followed up with patient within an hour.  Patient verbalized pain a 2/10 and less "gas pain" reported.  RN will continue to monitor patient.

## 2013-02-26 NOTE — Op Note (Addendum)
Preoperative diagnosis:  Cholelithiasis with cholecystitis  Postoperative diagnosis:  Same  Procedure: Laparoscopic cholecystectomy with cholangiogram.  Surgeon: Avel Peaceodd Ashli Selders, M.D.  Asst.:  Zola ButtonWill Jennings PA  Anesthesia: General  Indication:   This is a 38 year old female whose been having problems with epigastric and right upper quadrant pain since October 2014. She came in with a severe episode and had right upper quadrant tenderness, leukocytosis, and elevation of one of her transaminases. CT and ultrasound both demonstrated a thickened gallbladder wall with gallstones.  She now presents for the above procedure.  Technique: She was brought to the operating room, placed supine on the operating table, and a general anesthetic was administered.  The abdominal wall was then sterilely prepped and draped. Local anesthetic (Marcaine) was infiltrated in the subumbilical region. A small subumbilical incision was made through the skin, subcutaneous tissue, fascia, and peritoneum entering the peritoneal cavity under direct vision. A pursestring suture of 0 Vicryl was placed around the edges of the fascia. A Hassan trocar was introduced into the peritoneal cavity and a pneumoperitoneum was created by insufflation of carbon dioxide gas. The laparoscope was introduced into the trocar and no underlying bleeding or organ injury was noted. She was then placed in the reverse Trendelenburg position with the right side tilted slightly up.  Three 5 mm trocars were then placed into the abdominal cavity under laparoscopic vision. One in the epigastric area, and 2 in the right upper quadrant area. The gallbladder was visualized and the fundus was grasped and retracted toward the right shoulder.  The gallbladder appeared somewhat edematous. There were mild acute inflammatory changes noted. The infundibulum was mobilized with dissection close to the gallbladder and retracted laterally. The cystic duct was identified and  a window was created around it. The anterior branch of the cystic artery was also identified and a window was created around it. The critical view was achieved. A clip was placed at the neck of the gallbladder. A small incision was made in the cystic duct.  A fragment of a stone was milked out of the duct.  A cholangiocatheter was introduced through the anterior abdominal wall and placed in the cystic duct. A intraoperative cholangiogram was then performed.  Under real-time fluoroscopy, dilute contrast was injected into the cystic duct.  The common hepatic duct, the right and left hepatic ducts, and the common duct were all visualized. Contrast drained into the duodenum without obvious evidence of any obstructing ductal lesion. The final report is pending the Radiologist's interpretation.  The cholangiocatheter was removed, the cystic duct was clipped 2 times on the biliary side then a PDS Endoloop was placed, and then the cystic duct was divided sharply. No bile leak was noted from the cystic duct stump.  The anterior branch of the cystic artery was then clipped and divided. The posterior branch of the cystic artery was identified. A window was created around it. It was clipped and divided. Following this the gallbladder was dissected free from the liver using electrocautery. The gallbladder was then placed in a retrieval bag and removed from the abdominal cavity through the subumbilical incision.  The gallbladder fossa was inspected, irrigated, and bleeding was controlled with electrocautery. Inspection showed that hemostasis was adequate and there was no evidence of bile leak.  The irrigation fluid was evacuated as much as possible.  The subumbilical trocar was removed and the fascial defect was closed by tightening and tying down the pursestring suture under laparoscopic vision.  The remaining trocars were removed and  the pneumoperitoneum was released. The skin incisions were closed with 4-0 Monocryl  subcuticular stitches. Steri-Strips and sterile dressings were applied.  The procedure was well-tolerated without any apparent complications. She was taken to the recovery room in satisfactory condition.

## 2013-02-26 NOTE — ED Notes (Signed)
Patient is alert and oriented x3.  She is complaining of lower generalized pain that  Is a burning sensation. She states that she this has been going on for a few weeks and Her PCP is trying to treat it medically.  Currently she rates her pain 7 of10

## 2013-02-26 NOTE — ED Notes (Signed)
Consent signed by pt and performing MD.

## 2013-02-26 NOTE — H&P (Signed)
Rosilyn Kelly is an 38 y.o. female.   Chief Complaint: abd pain HPI: PT presents with acute exacerbation of her chronic, intermittent upper abd pain.  She reports this pain occurs in her mid-epigastric region and is sometimes triggered by fatty foods.  She has been treating this with Nexium with some success.  The episode last night was associated with nausea and vomiting, as well as diarrhea.  She reports that it didn't resolve as quickly as her other episodes also.  She was recently on clindamycin for strep throat, which also made her pain worse.  Past Medical History  Diagnosis Date  . Anxiety   . Diabetes mellitus without complication   . Polycystic ovarian disease   . Migraines   . Depression   . Abdominal pain in female patient 11/2012    Past Surgical History  Procedure Laterality Date  . Wisdom tooth extraction      No family history on file. Social History:  reports that she has been smoking Cigarettes.  She has a 4 pack-year smoking history. She does not have any smokeless tobacco history on file. She reports that she drinks alcohol. She reports that she does not use illicit drugs.  Allergies:  Allergies  Allergen Reactions  . Penicillins   . Vicodin [Hydrocodone-Acetaminophen] Itching and Nausea And Vomiting  . Sulfa Antibiotics Rash     (Not in a hospital admission)  Results for orders placed during the hospital encounter of 02/26/13 (from the past 48 hour(s))  URINALYSIS, ROUTINE W REFLEX MICROSCOPIC     Status: Abnormal   Collection Time    02/26/13  1:48 AM      Result Value Range   Color, Urine YELLOW  YELLOW   APPearance CLOUDY (*) CLEAR   Specific Gravity, Urine 1.017  1.005 - 1.030   pH 6.0  5.0 - 8.0   Glucose, UA NEGATIVE  NEGATIVE mg/dL   Hgb urine dipstick NEGATIVE  NEGATIVE   Bilirubin Urine NEGATIVE  NEGATIVE   Ketones, ur NEGATIVE  NEGATIVE mg/dL   Protein, ur NEGATIVE  NEGATIVE mg/dL   Urobilinogen, UA 0.2  0.0 - 1.0 mg/dL   Nitrite NEGATIVE   NEGATIVE   Leukocytes, UA NEGATIVE  NEGATIVE   Comment: MICROSCOPIC NOT DONE ON URINES WITH NEGATIVE PROTEIN, BLOOD, LEUKOCYTES, NITRITE, OR GLUCOSE <1000 mg/dL.  CBC WITH DIFFERENTIAL     Status: Abnormal   Collection Time    02/26/13  1:54 AM      Result Value Range   WBC 16.4 (*) 4.0 - 10.5 K/uL   RBC 4.68  3.87 - 5.11 MIL/uL   Hemoglobin 12.5  12.0 - 15.0 g/dL   HCT 38.4  36.0 - 46.0 %   MCV 82.1  78.0 - 100.0 fL   MCH 26.7  26.0 - 34.0 pg   MCHC 32.6  30.0 - 36.0 g/dL   RDW 14.2  11.5 - 15.5 %   Platelets 274  150 - 400 K/uL   Neutrophils Relative % 82 (*) 43 - 77 %   Neutro Abs 13.4 (*) 1.7 - 7.7 K/uL   Lymphocytes Relative 13  12 - 46 %   Lymphs Abs 2.1  0.7 - 4.0 K/uL   Monocytes Relative 4  3 - 12 %   Monocytes Absolute 0.6  0.1 - 1.0 K/uL   Eosinophils Relative 1  0 - 5 %   Eosinophils Absolute 0.2  0.0 - 0.7 K/uL   Basophils Relative 0  0 - 1 %  Basophils Absolute 0.0  0.0 - 0.1 K/uL  COMPREHENSIVE METABOLIC PANEL     Status: Abnormal   Collection Time    02/26/13  1:54 AM      Result Value Range   Sodium 143  137 - 147 mEq/L   Potassium 3.9  3.7 - 5.3 mEq/L   Chloride 104  96 - 112 mEq/L   CO2 22  19 - 32 mEq/L   Glucose, Bld 113 (*) 70 - 99 mg/dL   BUN 14  6 - 23 mg/dL   Creatinine, Ser 0.70  0.50 - 1.10 mg/dL   Calcium 8.9  8.4 - 10.5 mg/dL   Total Protein 7.5  6.0 - 8.3 g/dL   Albumin 3.6  3.5 - 5.2 g/dL   AST 51 (*) 0 - 37 U/L   ALT 26  0 - 35 U/L   Alkaline Phosphatase 113  39 - 117 U/L   Total Bilirubin 0.3  0.3 - 1.2 mg/dL   GFR calc non Af Amer >90  >90 mL/min   GFR calc Af Amer >90  >90 mL/min   Comment: (NOTE)     The eGFR has been calculated using the CKD EPI equation.     This calculation has not been validated in all clinical situations.     eGFR's persistently <90 mL/min signify possible Chronic Kidney     Disease.  LIPASE, BLOOD     Status: None   Collection Time    02/26/13  1:54 AM      Result Value Range   Lipase 49  11 - 59 U/L   POCT PREGNANCY, URINE     Status: None   Collection Time    02/26/13  1:54 AM      Result Value Range   Preg Test, Ur NEGATIVE  NEGATIVE   Comment:            THE SENSITIVITY OF THIS     METHODOLOGY IS >24 mIU/mL   Ct Abdomen Pelvis W Contrast  02/26/2013   CLINICAL DATA:  Severe abdominal pain with nausea, vomiting, diarrhea.  EXAM: CT ABDOMEN AND PELVIS WITH CONTRAST  TECHNIQUE: Multidetector CT imaging of the abdomen and pelvis was performed using the standard protocol following bolus administration of intravenous contrast.  CONTRAST:  116m OMNIPAQUE IOHEXOL 300 MG/ML  SOLN  COMPARISON:  None.  FINDINGS: BODY WALL: Unremarkable.  LOWER CHEST: Unremarkable.  ABDOMEN/PELVIS:  Liver: Periportal mid a low-attenuation is likely a combination of prominent biliary tree and periportal edema.  Biliary: Circumferential thickening of the gallbladder wall. There are gallstones in the neck. No gallbladder distention to suggest obstruction. Ductal structures in the pancreatic head appear dilated, but when considering there is a low insertion of the cystic duct, extending into the pancreatic head, individual ducts are not dilated at 5 mm.  Pancreas: Unremarkable.  Spleen: Unremarkable.  Adrenals: Unremarkable.  Kidneys and ureters: No hydronephrosis or stone. Accessory right renal artery from the common iliac.  Bladder: Unremarkable.  Reproductive: There appears to be multiple follicles/cyst in the bilateral ovaries. No asymmetric ovarian enlargement.  Bowel: No obstruction. Normal appendix.  Retroperitoneum: No mass or adenopathy.  Peritoneum: No free fluid or gas.  Vascular: No acute abnormality.  OSSEOUS: Chronic appearing T12 superior endplate deformity.  IMPRESSION: 1. Mild periportal edema is often from volume resuscitation, but can be seen with hepatitis. Correlate with comprehensive metabolic panel. 2. Gallbladder wall thickening. Even though there are a gallstones, this is most likely reactive given there  is no gallbladder distention to suggest cystic duct obstruction. 3. Low insertion of the cystic duct, joining the hepatic duct at the pancreatic head.   Electronically Signed   By: Jorje Guild M.D.   On: 02/26/2013 04:20   US Abdomen Limited Ruq  02/26/2013   CLINICAL DATA:  Gallstones on prior CT.  EXAM: US ABDOMEN LIMITED - RIGHT UPPER QUADRANT  COMPARISON:  None.  FINDINGS: Gallbladder  There are numerous gallstones within the gallbladder. Some appear nonmobile and anti dependent, and could represent sub cm polyps. The wall is thickened to 7 mm, and contains echogenic foci with ring down artifact. The gallbladder is not distended. There is reportedly no sonographic Murphy sign.  Common bile duct  Diameter: 9 mm proximally. No evidence of biliary obstruction by recent LFTs. Apparent shadowing at the pancreatic head is not particularly dense, and there is no leading echogenic focus as would be expected with choledocholithiasis.  Liver:  The echotexture of the liver is heterogeneous. No definite fatty infiltration based on previous CT. No focal lesions seen. Antegrade flow in the imaged portal venous system.  IMPRESSION: 1. Cholelithiasis.  Negative for acute cholecystitis. 2. Marked gallbladder wall thickening which is likely from adenomyomatosis, underdistention, and possibly reactive edema. 3. 9 mm proximal common bile duct.   Electronically Signed   By: Jorje Guild M.D.   On: 02/26/2013 07:08    Review of Systems  Constitutional: Negative for fever and chills.  Respiratory: Negative for cough, sputum production and shortness of breath.   Cardiovascular: Negative for chest pain.  Gastrointestinal: Positive for nausea, vomiting, abdominal pain and diarrhea. Negative for constipation and blood in stool.  Genitourinary: Negative for dysuria, urgency and frequency.  Skin: Negative for rash.  Neurological: Negative for headaches.    Blood pressure 119/86, pulse 63, temperature 97.6 F (36.4  C), temperature source Oral, resp. rate 18, height 5' 7"  (1.702 m), weight 210 lb (95.255 kg), last menstrual period 02/12/2013, SpO2 99.00%. Physical Exam  Constitutional: She is oriented to person, place, and time. She appears well-developed and well-nourished. No distress.  HENT:  Head: Normocephalic and atraumatic.  Eyes: Conjunctivae are normal. Pupils are equal, round, and reactive to light.  Neck: Normal range of motion. Neck supple.  Cardiovascular: Normal rate and regular rhythm.   Respiratory: Effort normal and breath sounds normal. No respiratory distress.  GI: Soft. She exhibits no distension. There is tenderness.  RUQ tenderness  Musculoskeletal: Normal range of motion.  Neurological: She is alert and oriented to person, place, and time.  Skin: Skin is warm and dry. She is not diaphoretic.    Lab Results  Component Value Date   WBC 16.4* 02/26/2013   HGB 12.5 02/26/2013   HCT 38.4 02/26/2013   MCV 82.1 02/26/2013   PLT 274 02/26/2013   Lab Results  Component Value Date   ALT 26 02/26/2013   AST 51* 02/26/2013   ALKPHOS 113 02/26/2013   BILITOT 0.3 02/26/2013   RUQ US IMPRESSION:  1. Cholelithiasis. Negative for acute cholecystitis.  2. Marked gallbladder wall thickening which is likely from adenomyomatosis, underdistention, and possibly reactive edema.  3. 9 mm proximal common bile duct.   Assessment/Plan Gean Welliver is a 38 y.o. F who has signs and symptoms most consistent with acute cholecystitis.  She has a thickened GB wall and elevated wbc with pain to palpation in her RUQ and positive Murphy sign on exam.  I will admit her to our service, and Dr Zella Richer will  evaluate for possible surgery later today.  We will start IVF's and IV Cipro.    Markies Mowatt C. 05/14/9793, 3:69 AM

## 2013-02-26 NOTE — ED Provider Notes (Signed)
CSN: [id number]     Arrival date & time 02/26/13  0128 History   First MD Initiated Contact with Patient 02/26/13 0222     Chief Complaint  Patient presents with  . Abdominal Pain    chronic  . Nausea  . Emesis   HPI  History provided by the patient. Patient is a 38 year old female with history of polycystic ovary disease, borderline diabetes, anxiety and depression who presents with complaints of ongoing and worsening abdominal pain. Patient states she has had episodes of abdominal pain with nausea vomiting symptoms since October. She states that she was recently placed on clindamycin for bacterial infection and after taking this has felt worse and abdominal pain this. Her symptoms have worsened over the past several days. They have also been associated with multiple episodes of diarrhea on Sunday. Stool was initially soft but then became watery without blood or mucus. She has not had significant diarrhea since at bedtime. She did have 2 episodes of vomiting yesterday evening and again this evening. She has been taking her Nexium and Carafate as prescribed by her doctor for her symptoms without any improvement. Denies any other aggravating or alleviating factors. No other associated symptoms.    Past Medical History  Diagnosis Date  . Anxiety   . Diabetes mellitus without complication   . Polycystic ovarian disease   . Migraines   . Depression   . Abdominal pain in female patient 11/2012   Past Surgical History  Procedure Laterality Date  . Wisdom tooth extraction     No family history on file. History  Substance Use Topics  . Smoking status: Current Every Day Smoker -- 0.50 packs/day for 8 years    Types: Cigarettes  . Smokeless tobacco: Not on file  . Alcohol Use: Yes     Comment: denies, empty bottle at home per EMS   OB History   Grav Para Term Preterm Abortions TAB SAB Ect Mult Living                 Review of Systems  Constitutional: Positive for appetite change.  Negative for fever, chills and diaphoresis.  Respiratory: Negative for cough.   Cardiovascular: Negative for chest pain.  Gastrointestinal: Positive for nausea, vomiting, abdominal pain and diarrhea. Negative for constipation and blood in stool.  Genitourinary: Negative for dysuria, frequency, hematuria and flank pain.  All other systems reviewed and are negative.    Allergies  Penicillins; Vicodin; and Sulfa antibiotics  Home Medications   Current Outpatient Rx  Name  Route  Sig  Dispense  Refill  . azithromycin (ZITHROMAX Z-PAK) 250 MG tablet      Take as directed on pack   6 tablet   0   . clindamycin (CLEOCIN) 300 MG capsule   Oral   Take 1 capsule (300 mg total) by mouth 4 (four) times daily.   40 capsule   0   . Diphenhyd-Hydrocort-Nystatin (FIRST-DUKES MOUTHWASH) SUSP      1 teaspoon as rinse gargle and spit 4 times a day   240 mL   1   . DULoxetine (CYMBALTA) 60 MG capsule   Oral   Take 60 mg by mouth daily.         Marland Kitchen HYDROcodone-homatropine (HYCODAN) 5-1.5 MG/5ML syrup   Oral   Take 5 mLs by mouth every 8 (eight) hours as needed for cough.   120 mL   0   . METFORMIN HCL PO   Oral   Take by  mouth.          BP 113/71  Pulse 85  Temp(Src) 97.6 F (36.4 C) (Oral)  Resp 20  Ht 5\' 7"  (1.702 m)  Wt 210 lb (95.255 kg)  BMI 32.88 kg/m2  SpO2 98%  LMP 02/12/2013 Physical Exam  Nursing note and vitals reviewed. Constitutional: She is oriented to person, place, and time. She appears well-developed and well-nourished. No distress.  HENT:  Head: Normocephalic.  Cardiovascular: Normal rate and regular rhythm.   No murmur heard. Pulmonary/Chest: Effort normal and breath sounds normal. No respiratory distress. She has no wheezes.  Abdominal: Soft. There is tenderness in the epigastric area, periumbilical area and left upper quadrant. There is no rigidity, no rebound, no guarding, no CVA tenderness, no tenderness at McBurney's point and negative Murphy's  sign.  Neurological: She is alert and oriented to person, place, and time.  Skin: Skin is warm and dry. No rash noted.  Psychiatric: She has a normal mood and affect. Her behavior is normal.    ED Course  Procedures   DIAGNOSTIC STUDIES: Oxygen Saturation is 98% on room air.    COORDINATION OF CARE:  Nursing notes reviewed. Vital signs reviewed. Initial pt interview and examination performed.   2:47 AM-patient seen and evaluated. She appears in some discomfort. Does not appear severely ill or toxic. Discussed work up plan with pt at bedside, which includes testing and CT. Pt agrees with plan.  4:00 AM patient reports having some good improvements of her pain and nausea symptoms. CT results still pending.  5:00 AM spoke with patient about CT findings for gallstones and gallbladder wall thickening. Patient findings were also discussed with attending physician. We'll plan to consult general surgery.  5:40 AM spoke with Dr. Maisie Fus with general surgery. She requests having an abdominal ultrasound performed for better evaluation of the gallbladder. Would like to be called back when the results are in.  6:00AM Pt discussed in sign out with Ellin Saba PA-C.  She will follow [place] results and follow up with general surgery.  Treatment plan initiated: Medications  sodium chloride 0.9 % bolus 1,000 mL (not administered)  metoCLOPramide (REGLAN) injection 10 mg (not administered)  morphine 4 MG/ML injection 4 mg (not administered)  iohexol (OMNIPAQUE) 300 MG/ML solution 50 mL (not administered)   Results for orders placed during the hospital encounter of 02/26/13  CBC WITH DIFFERENTIAL      Result Value Range   WBC 16.4 (*) 4.0 - 10.5 K/uL   RBC 4.68  3.87 - 5.11 MIL/uL   Hemoglobin 12.5  12.0 - 15.0 g/dL   HCT 19.1  47.8 - 29.5 %   MCV 82.1  78.0 - 100.0 fL   MCH 26.7  26.0 - 34.0 pg   MCHC 32.6  30.0 - 36.0 g/dL   RDW 62.1  30.8 - 65.7 %   Platelets 274  150 - 400 K/uL    Neutrophils Relative % 82 (*) 43 - 77 %   Neutro Abs 13.4 (*) 1.7 - 7.7 K/uL   Lymphocytes Relative 13  12 - 46 %   Lymphs Abs 2.1  0.7 - 4.0 K/uL   Monocytes Relative 4  3 - 12 %   Monocytes Absolute 0.6  0.1 - 1.0 K/uL   Eosinophils Relative 1  0 - 5 %   Eosinophils Absolute 0.2  0.0 - 0.7 K/uL   Basophils Relative 0  0 - 1 %   Basophils Absolute 0.0  0.0 - 0.1  K/uL  COMPREHENSIVE METABOLIC PANEL      Result Value Range   Sodium 143  137 - 147 mEq/L   Potassium 3.9  3.7 - 5.3 mEq/L   Chloride 104  96 - 112 mEq/L   CO2 22  19 - 32 mEq/L   Glucose, Bld 113 (*) 70 - 99 mg/dL   BUN 14  6 - 23 mg/dL   Creatinine, Ser 1.610.70  0.50 - 1.10 mg/dL   Calcium 8.9  8.4 - 09.610.5 mg/dL   Total Protein 7.5  6.0 - 8.3 g/dL   Albumin 3.6  3.5 - 5.2 g/dL   AST 51 (*) 0 - 37 U/L   ALT 26  0 - 35 U/L   Alkaline Phosphatase 113  39 - 117 U/L   Total Bilirubin 0.3  0.3 - 1.2 mg/dL   GFR calc non Af Amer >90  >90 mL/min   GFR calc Af Amer >90  >90 mL/min  LIPASE, BLOOD      Result Value Range   Lipase 49  11 - 59 U/L  URINALYSIS, ROUTINE W REFLEX MICROSCOPIC      Result Value Range   Color, Urine YELLOW  YELLOW   APPearance CLOUDY (*) CLEAR   Specific Gravity, Urine 1.017  1.005 - 1.030   pH 6.0  5.0 - 8.0   Glucose, UA NEGATIVE  NEGATIVE mg/dL   Hgb urine dipstick NEGATIVE  NEGATIVE   Bilirubin Urine NEGATIVE  NEGATIVE   Ketones, ur NEGATIVE  NEGATIVE mg/dL   Protein, ur NEGATIVE  NEGATIVE mg/dL   Urobilinogen, UA 0.2  0.0 - 1.0 mg/dL   Nitrite NEGATIVE  NEGATIVE   Leukocytes, UA NEGATIVE  NEGATIVE  POCT PREGNANCY, URINE      Result Value Range   Preg Test, Ur NEGATIVE  NEGATIVE      Imaging Review Ct Abdomen Pelvis W Contrast  02/26/2013   CLINICAL DATA:  Severe abdominal pain with nausea, vomiting, diarrhea.  EXAM: CT ABDOMEN AND PELVIS WITH CONTRAST  TECHNIQUE: Multidetector CT imaging of the abdomen and pelvis was performed using the standard protocol following bolus administration  of intravenous contrast.  CONTRAST:  100mL OMNIPAQUE IOHEXOL 300 MG/ML  SOLN  COMPARISON:  None.  FINDINGS: BODY WALL: Unremarkable.  LOWER CHEST: Unremarkable.  ABDOMEN/PELVIS:  Liver: Periportal mid a low-attenuation is likely a combination of prominent biliary tree and periportal edema.  Biliary: Circumferential thickening of the gallbladder wall. There are gallstones in the neck. No gallbladder distention to suggest obstruction. Ductal structures in the pancreatic head appear dilated, but when considering there is a low insertion of the cystic duct, extending into the pancreatic head, individual ducts are not dilated at 5 mm.  Pancreas: Unremarkable.  Spleen: Unremarkable.  Adrenals: Unremarkable.  Kidneys and ureters: No hydronephrosis or stone. Accessory right renal artery from the common iliac.  Bladder: Unremarkable.  Reproductive: There appears to be multiple follicles/cyst in the bilateral ovaries. No asymmetric ovarian enlargement.  Bowel: No obstruction. Normal appendix.  Retroperitoneum: No mass or adenopathy.  Peritoneum: No free fluid or gas.  Vascular: No acute abnormality.  OSSEOUS: Chronic appearing T12 superior endplate deformity.  IMPRESSION: 1. Mild periportal edema is often from volume resuscitation, but can be seen with hepatitis. Correlate with comprehensive metabolic panel. 2. Gallbladder wall thickening. Even though there are a gallstones, this is most likely reactive given there is no gallbladder distention to suggest cystic duct obstruction. 3. Low insertion of the cystic duct, joining the hepatic duct at  the pancreatic head.   Electronically Signed   By: Tiburcio Pea M.D.   On: 02/26/2013 04:20      MDM   1. Cholelithiasis   2. Abdominal pain   3. Nausea & vomiting         Angus Seller, [place] 02/26/13 [phone]

## 2013-02-26 NOTE — Progress Notes (Signed)
Day of Surgery   Called on arrival to floor.  Pt had Cipro started in ER and on arrival to the floor she has hives Left arm IV site.  They are resolving quickly with cessation of the Cipro.  I have ordered 25 MG IV benadryl.  No respiratory issues, I do not see hives going beyond the arm.

## 2013-02-26 NOTE — Anesthesia Preprocedure Evaluation (Signed)
Anesthesia Evaluation  Patient identified by MRN, date of birth, ID band Patient awake    Reviewed: Allergy & Precautions, H&P , NPO status , Patient's Chart, lab work & pertinent test results  Airway Mallampati: II TM Distance: >3 FB Neck ROM: Full    Dental no notable dental hx.    Pulmonary neg pulmonary ROS, Current Smoker,  breath sounds clear to auscultation  Pulmonary exam normal       Cardiovascular negative cardio ROS  Rhythm:Regular Rate:Normal     Neuro/Psych negative neurological ROS  negative psych ROS   GI/Hepatic negative GI ROS, Neg liver ROS,   Endo/Other  negative endocrine ROSdiabetes, Type 2, Oral Hypoglycemic Agents  Renal/GU negative Renal ROS  negative genitourinary   Musculoskeletal negative musculoskeletal ROS (+)   Abdominal   Peds negative pediatric ROS (+)  Hematology negative hematology ROS (+)   Anesthesia Other Findings   Reproductive/Obstetrics negative OB ROS                           Anesthesia Physical Anesthesia Plan  ASA: II  Anesthesia Plan: General   Post-op Pain Management:    Induction: Intravenous  Airway Management Planned: Oral ETT  Additional Equipment:   Intra-op Plan:   Post-operative Plan: Extubation in OR  Informed Consent: I have reviewed the patients History and Physical, chart, labs and discussed the procedure including the risks, benefits and alternatives for the proposed anesthesia with the patient or authorized representative who has indicated his/her understanding and acceptance.   Dental advisory given  Plan Discussed with: CRNA  Anesthesia Plan Comments:         Anesthesia Quick Evaluation

## 2013-02-26 NOTE — Progress Notes (Signed)
Patient transferred to surgery via bed. Patient from ED with dose of Cipro IV infusing. Patient was scratching lt arm with redness and whelps noted. Infusion stopped immediately. Will Hillside [place]ennings PA was present on floor and came in and noted lt arm. 1x dose of benadryl 25mg  IV given as ordered. Med d/c'ed per PA. Itching and redness resolved. Whelps resolving. Surgery nurse called at [phone] to make aware patient had reaction to cipro and benadryl given.

## 2013-02-26 NOTE — Progress Notes (Signed)
Patient seen and examined.  She has clinical and radiographic findings consistent with cholelithiasis and cholecystitis.  Plan laparoscopic possible open cholecystectomy with IOC today.  I have explained the procedure, risks, and aftercare of cholecystectomy.  Risks include but are not limited to bleeding, infection, wound problems, anesthesia, diarrhea, bile leak, injury to common bile duct/liver/intestine, need for re-operation.  She seems to understand and agrees to proceed.

## 2013-02-26 NOTE — ED Notes (Addendum)
Per EMS, patient from home with chronic abd pain, acute onset @0000  with nausea and vomiting. Original pain started in October 2014. Denies ETOH, multiple empty bottles in home.

## 2013-02-26 NOTE — Transfer of Care (Signed)
Immediate Anesthesia Transfer of Care Note  Patient: Wendy Maynard  Procedure(s) Performed: Procedure(s): LAPAROSCOPIC CHOLECYSTECTOMY WITH INTRAOPERATIVE CHOLANGIOGRAM (N/A)  Patient Location: PACU  Anesthesia Type:General  Level of Consciousness: awake, alert , oriented and patient cooperative  Airway & Oxygen Therapy: Patient Spontanous Breathing and Patient connected to face mask oxygen  Post-op Assessment: Report given to PACU RN, Post -op Vital signs reviewed and stable and Patient moving all extremities X 4  Post vital signs: stable  Complications: No apparent anesthesia complications

## 2013-02-26 NOTE — ED Notes (Signed)
Bed: [facility] Expected date: 02/26/13 Expected time: 1:21 AM Means of arrival: Ambulance Comments: abd pain

## 2013-02-26 NOTE — Progress Notes (Signed)
Noted.  Cipro added to allergy list.

## 2013-02-26 NOTE — Anesthesia Postprocedure Evaluation (Signed)
  Anesthesia Post-op Note  Patient: Wendy Maynard  Procedure(s) Performed: Procedure(s) (LRB): LAPAROSCOPIC CHOLECYSTECTOMY WITH INTRAOPERATIVE CHOLANGIOGRAM (N/A)  Patient Location: PACU  Anesthesia Type: General  Level of Consciousness: awake and alert   Airway and Oxygen Therapy: Patient Spontanous Breathing  Post-op Pain: mild  Post-op Assessment: Post-op Vital signs reviewed, Patient's Cardiovascular Status Stable, Respiratory Function Stable, Patent Airway and No signs of Nausea or vomiting  Last Vitals:  Filed Vitals:   02/26/13 1341  BP: 142/78  Pulse: 48  Temp: 36.5 C  Resp: 15    Post-op Vital Signs: stable   Complications: No apparent anesthesia complications

## 2013-02-27 LAB — CBC
HEMATOCRIT: 33.9 % — AB (ref 36.0–46.0)
Hemoglobin: 11.1 g/dL — ABNORMAL LOW (ref 12.0–15.0)
MCH: 26.7 pg (ref 26.0–34.0)
MCHC: 32.7 g/dL (ref 30.0–36.0)
MCV: 81.7 fL (ref 78.0–100.0)
PLATELETS: 231 10*3/uL (ref 150–400)
RBC: 4.15 MIL/uL (ref 3.87–5.11)
RDW: 14.4 % (ref 11.5–15.5)
WBC: 15.3 10*3/uL — AB (ref 4.0–10.5)

## 2013-02-27 MED ORDER — OXYCODONE HCL 5 MG PO TABS: 5.0000 mg | ORAL_TABLET | ORAL | Status: DC | PRN

## 2013-02-27 NOTE — Discharge Summary (Signed)
Physician Discharge Summary  Patient ID:  Wendy Maynard  MRN: [id number]  DOB/AGE: [birth year] 37 y.o.  Admit date: 02/26/2013 Discharge date: 02/27/2013  Discharge Diagnoses:  1.  Cholecystitis  2. Some chronic GI complaints, which are hopefully related to her gall bladder disease. 3.  Obese   Active Problems:   Cholecystitis  Operation: Procedure(s): LAPAROSCOPIC CHOLECYSTECTOMY WITH INTRAOPERATIVE CHOLANGIOGRAM on 02/26/2013 - T. [facility]  Discharged Condition: good  Hospital Course: Wendy Maynard is an 38 y.o. female whose primary care physician is Wendy Maynard and who was admitted 02/26/2013 with a chief complaint of  Chief Complaint  Patient presents with  . Abdominal Pain    chronic  . Nausea  . Emesis  .   She was brought to the operating room on 02/26/2013 and underwent  LAPAROSCOPIC CHOLECYSTECTOMY WITH INTRAOPERATIVE CHOLANGIOGRAM.  She is now one day post op.  Her mother and father are in the room.  Her mother is a Publishing rights managernurse practitioner.  Her WBC is slightly better at 15,300.  She feels better except for gas.  She is ready to go home. The discharge instructions were reviewed with the patient. She'll have a prescription for percocet.  A note to be out of work until 03/08/2013.  She works as a Advice workerschool librarian.  Consults: None  Significant Diagnostic Studies: Results for orders placed during the hospital encounter of 02/26/13  SURGICAL PCR SCREEN      Result Value Range   MRSA, PCR NEGATIVE  NEGATIVE   Staphylococcus aureus NEGATIVE  NEGATIVE  CBC WITH DIFFERENTIAL      Result Value Range   WBC 16.4 (*) 4.0 - 10.5 K/uL   RBC 4.68  3.87 - 5.11 MIL/uL   Hemoglobin 12.5  12.0 - 15.0 g/dL   HCT 95.638.4  21.336.0 - 08.646.0 %   MCV 82.1  78.0 - 100.0 fL   MCH 26.7  26.0 - 34.0 pg   MCHC 32.6  30.0 - 36.0 g/dL   RDW 57.814.2  46.911.5 - 62.915.5 %   Platelets 274  150 - 400 K/uL   Neutrophils Relative % 82 (*) 43 - 77 %   Neutro Abs 13.4 (*) 1.7 - 7.7 K/uL   Lymphocytes Relative 13  12  - 46 %   Lymphs Abs 2.1  0.7 - 4.0 K/uL   Monocytes Relative 4  3 - 12 %   Monocytes Absolute 0.6  0.1 - 1.0 K/uL   Eosinophils Relative 1  0 - 5 %   Eosinophils Absolute 0.2  0.0 - 0.7 K/uL   Basophils Relative 0  0 - 1 %   Basophils Absolute 0.0  0.0 - 0.1 K/uL  COMPREHENSIVE METABOLIC PANEL      Result Value Range   Sodium 143  137 - 147 mEq/L   Potassium 3.9  3.7 - 5.3 mEq/L   Chloride 104  96 - 112 mEq/L   CO2 22  19 - 32 mEq/L   Glucose, Bld 113 (*) 70 - 99 mg/dL   BUN 14  6 - 23 mg/dL   Creatinine, Ser 5.280.70  0.50 - 1.10 mg/dL   Calcium 8.9  8.4 - 41.310.5 mg/dL   Total Protein 7.5  6.0 - 8.3 g/dL   Albumin 3.6  3.5 - 5.2 g/dL   AST 51 (*) 0 - 37 U/L   ALT 26  0 - 35 U/L   Alkaline Phosphatase 113  39 - 117 U/L   Total Bilirubin 0.3  0.3 - 1.2 mg/dL  GFR calc non Af Amer >90  >90 mL/min   GFR calc Af Amer >90  >90 mL/min  LIPASE, BLOOD      Result Value Range   Lipase 49  11 - 59 U/L  URINALYSIS, ROUTINE W REFLEX MICROSCOPIC      Result Value Range   Color, Urine YELLOW  YELLOW   APPearance CLOUDY (*) CLEAR   Specific Gravity, Urine 1.017  1.005 - 1.030   pH 6.0  5.0 - 8.0   Glucose, UA NEGATIVE  NEGATIVE mg/dL   Hgb urine dipstick NEGATIVE  NEGATIVE   Bilirubin Urine NEGATIVE  NEGATIVE   Ketones, ur NEGATIVE  NEGATIVE mg/dL   Protein, ur NEGATIVE  NEGATIVE mg/dL   Urobilinogen, UA 0.2  0.0 - 1.0 mg/dL   Nitrite NEGATIVE  NEGATIVE   Leukocytes, UA NEGATIVE  NEGATIVE  GLUCOSE, CAPILLARY      Result Value Range   Glucose-Capillary 75  70 - 99 mg/dL  CBC      Result Value Range   WBC 15.3 (*) 4.0 - 10.5 K/uL   RBC 4.15  3.87 - 5.11 MIL/uL   Hemoglobin 11.1 (*) 12.0 - 15.0 g/dL   HCT 78.2 (*) 95.6 - 21.3 %   MCV 81.7  78.0 - 100.0 fL   MCH 26.7  26.0 - 34.0 pg   MCHC 32.7  30.0 - 36.0 g/dL   RDW 08.6  57.8 - 46.9 %   Platelets 231  150 - 400 K/uL  POCT PREGNANCY, URINE      Result Value Range   Preg Test, Ur NEGATIVE  NEGATIVE    Dg Cholangiogram  Operative  02/26/2013   CLINICAL DATA:  Right upper abdominal pain  EXAM: INTRAOPERATIVE CHOLANGIOGRAM  TECHNIQUE: Cholangiographic images from the C-arm fluoroscopic device were submitted for interpretation post-operatively. Please see the procedural report for the amount of contrast and the fluoroscopy time utilized.  COMPARISON:  None.  FINDINGS: No persistent filling defects in the common duct. Intrahepatic ducts are incompletely visualized, appearing decompressed centrally. Contrast passes into the duodenum.  : Negative for retained common duct stone.   Electronically Signed   By: Oley Balm M.D.   On: 02/26/2013 13:20   Ct Abdomen Pelvis W Contrast  02/26/2013   CLINICAL DATA:  Severe abdominal pain with nausea, vomiting, diarrhea.  EXAM: CT ABDOMEN AND PELVIS WITH CONTRAST  TECHNIQUE: Multidetector CT imaging of the abdomen and pelvis was performed using the standard protocol following bolus administration of intravenous contrast.  CONTRAST:  OMNIPAQUE IOHEXOL 300 MG/ML  SOLN  COMPARISON:  None.  FINDINGS: BODY WALL: Unremarkable.  LOWER CHEST: Unremarkable.  ABDOMEN/PELVIS:  Liver: Periportal mid a low-attenuation is likely a combination of prominent biliary tree and periportal edema.  Biliary: Circumferential thickening of the gallbladder wall. There are gallstones in the neck. No gallbladder distention to suggest obstruction. Ductal structures in the pancreatic head appear dilated, but when considering there is a low insertion of the cystic duct, extending into the pancreatic head, individual ducts are not dilated at 5 mm.  Pancreas: Unremarkable.  Spleen: Unremarkable.  Adrenals: Unremarkable.  Kidneys and ureters: No hydronephrosis or stone. Accessory right renal artery from the common iliac.  Bladder: Unremarkable.  Reproductive: There appears to be multiple follicles/cyst in the bilateral ovaries. No asymmetric ovarian enlargement.  Bowel: No obstruction. Normal appendix.   Retroperitoneum: No mass or adenopathy.  Peritoneum: No free fluid or gas.  Vascular: No acute abnormality.  OSSEOUS: Chronic appearing T12 superior  endplate deformity.  IMPRESSION: 1. Mild periportal edema is often from volume resuscitation, but can be seen with hepatitis. Correlate with comprehensive metabolic panel. 2. Gallbladder wall thickening. Even though there are a gallstones, this is most likely reactive given there is no gallbladder distention to suggest cystic duct obstruction. 3. Low insertion of the cystic duct, joining the hepatic duct at the pancreatic head.   Electronically Signed   By: Tiburcio Pea M.D.   On: 02/26/2013 04:20   US Abdomen Limited Ruq  02/26/2013   CLINICAL DATA:  Gallstones on prior CT.  EXAM: US ABDOMEN LIMITED - RIGHT UPPER QUADRANT  COMPARISON:  None.  FINDINGS: Gallbladder  There are numerous gallstones within the gallbladder. Some appear nonmobile and anti dependent, and could represent sub cm polyps. The wall is thickened to 7 mm, and contains echogenic foci with ring down artifact. The gallbladder is not distended. There is reportedly no sonographic Murphy sign.  Common bile duct  Diameter: 9 mm proximally. No evidence of biliary obstruction by recent LFTs. Apparent shadowing at the pancreatic head is not particularly dense, and there is no leading echogenic focus as would be expected with choledocholithiasis.  Liver:  The echotexture of the liver is heterogeneous. No definite fatty infiltration based on previous CT. No focal lesions seen. Antegrade flow in the imaged portal venous system.  IMPRESSION: 1. Cholelithiasis.  Negative for acute cholecystitis. 2. Marked gallbladder wall thickening which is likely from adenomyomatosis, underdistention, and possibly reactive edema. 3. 9 mm proximal common bile duct.   Electronically Signed   By: Tiburcio Pea M.D.   On: 02/26/2013 07:08    Discharge Exam:  Filed Vitals:   02/27/13 0600  BP: 112/70  Pulse: 66  Temp:  98.1 F (36.7 C)  Resp: 18    General: WN obese WF who is alert.  Lungs: Clear to auscultation and symmetric breath sounds. Abdomen: Soft. No hernia. Normal bowel sounds. Incisions are covered and dressing dry.  Discharge Medications:     Medication List    TAKE these medications       oxyCODONE 5 MG immediate release tablet  Commonly known as:  Oxy IR/ROXICODONE  Take 1-2 tablets (5-10 mg total) by mouth every 4 (four) hours as needed.      ASK your doctor about these medications       ALPRAZolam 0.5 MG tablet  Commonly known as:  XANAX  Take 0.5 mg by mouth 3 (three) times daily as needed.     CARAFATE 1 GM/10ML suspension  Generic drug:  sucralfate  Take 1 g by mouth 4 (four) times daily.     DULoxetine 60 MG capsule  Commonly known as:  CYMBALTA  Take 60 mg by mouth daily.     metFORMIN 500 MG tablet  Commonly known as:  GLUCOPHAGE  Take 500 mg by mouth 2 (two) times daily with a meal.     NEXIUM 40 MG capsule  Generic drug:  esomeprazole  Take 40 mg by mouth daily.     traMADol-acetaminophen 37.5-325 MG per tablet  Commonly known as:  ULTRACET  Take 1 tablet by mouth every 6 (six) hours as needed for moderate pain.     zolpidem 10 MG tablet  Commonly known as:  AMBIEN  Take 10 mg by mouth at bedtime as needed.        Disposition: 01-Home or Self Care   Signed: Ovidio Kin, M.D., [facility] Surgery Office:  [phone]  02/27/2013, 10:06 AM

## 2013-03-01 ENCOUNTER — Encounter (HOSPITAL_COMMUNITY): Payer: Self-pay | Admitting: General Surgery

## 2013-03-23 ENCOUNTER — Ambulatory Visit (INDEPENDENT_AMBULATORY_CARE_PROVIDER_SITE_OTHER): Payer: BC Managed Care – PPO | Admitting: General Surgery

## 2013-03-23 ENCOUNTER — Encounter (INDEPENDENT_AMBULATORY_CARE_PROVIDER_SITE_OTHER): Payer: Self-pay | Admitting: General Surgery

## 2013-03-23 VITALS — BP 108/70 | HR 84 | Temp 97.9°F | Resp 18 | Ht 67.0 in | Wt 214.4 lb

## 2013-03-23 DIAGNOSIS — K8010 Calculus of gallbladder with chronic cholecystitis without obstruction: Secondary | ICD-10-CM | POA: Insufficient documentation

## 2013-03-23 NOTE — Progress Notes (Signed)
Procedure:  Laparoscopic cholecystectomy with cholangiogram  Date:  02/26/2013  Pathology:  Chronic active cholecystitis with cholelithiasis  History:  She is here for her first postoperative visit.  She was admitted to the emergency department and had an urgent cholecystectomy. Fortunately, she did well enough to be able to go home the next day. She has minimal discomfort. She is now tolerating her diet for the most part but has had some postprandial diarrhea if she eats some fatty type food.  Exam: General- Is in NAD. Abdomen-soft, incisions are clean and intact.  Assessment:  Progressing well after urgent cholecystectomy.  Plan:  Low-fat diet recommended. Activities as tolerated. Return when necessary.

## 2013-03-23 NOTE — Patient Instructions (Signed)
Low-fat diet recommended. Activities as tolerated.

## 2014-04-29 ENCOUNTER — Other Ambulatory Visit: Payer: Self-pay

## 2014-05-03 LAB — CYTOLOGY - PAP

## 2014-10-09 IMAGING — US US ABDOMEN LIMITED
1 series · 14 of 25 positions shown · non-contrast
Comparison: None.

CLINICAL DATA: Gallstones on prior CT.

EXAM:
US ABDOMEN LIMITED - RIGHT UPPER QUADRANT

[Series 1: us abdomen limited · 0.27mm/px · 14 of 55 slices shown]
[im 1/55]
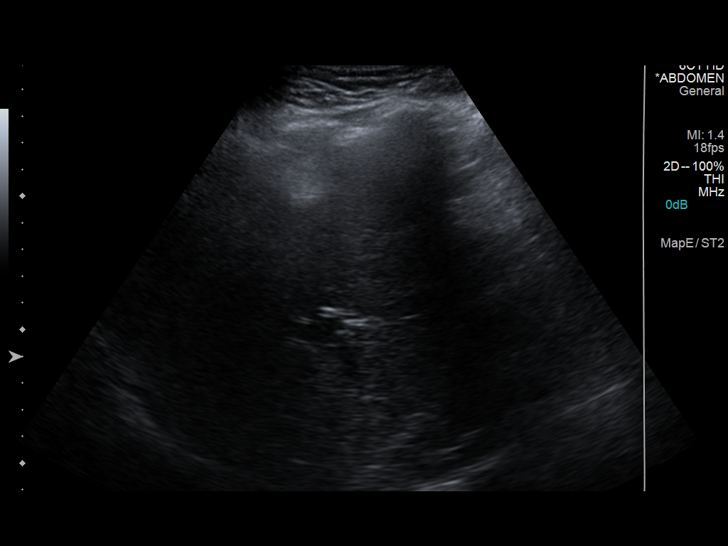
[im 5/55]
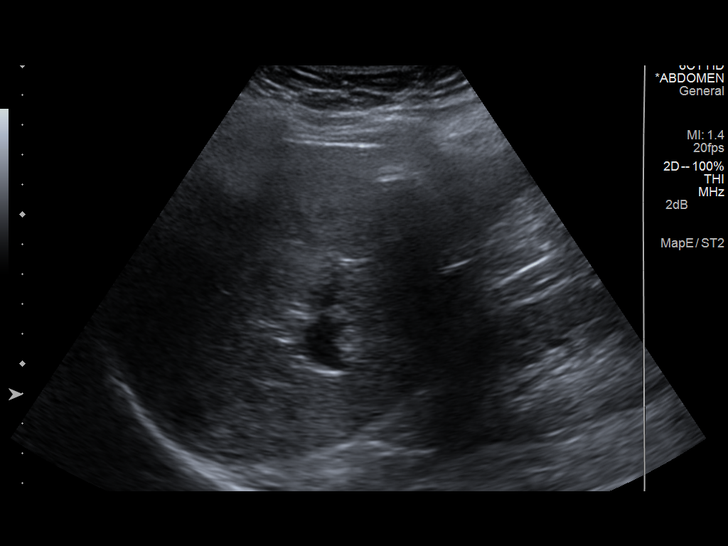
[im 10/55]
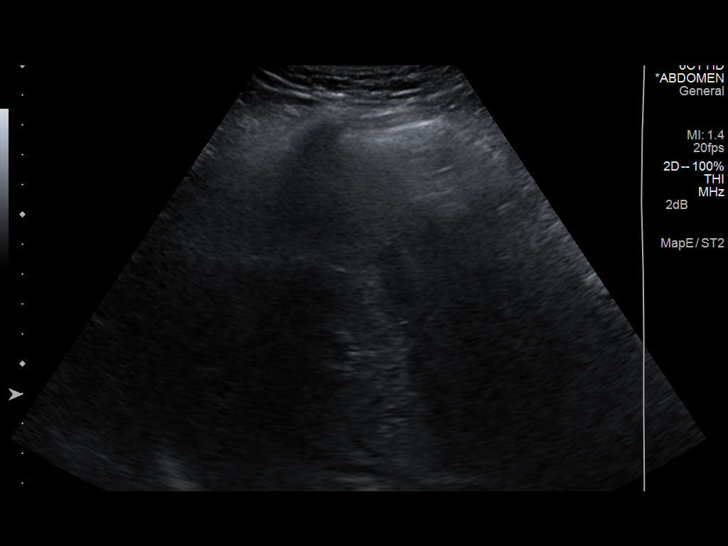
[im 14/55]
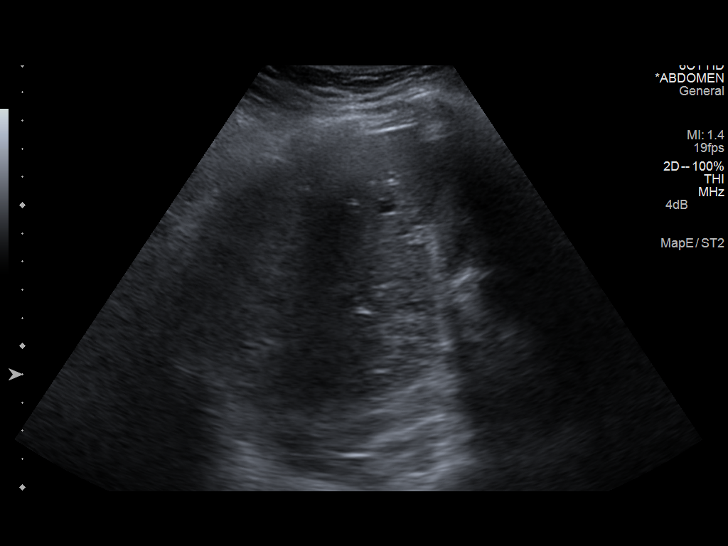
[im 19/55]
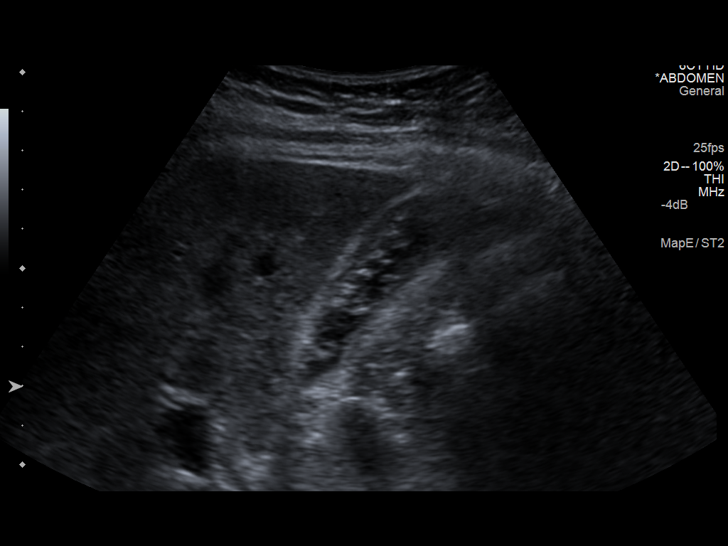
[im 21/55]
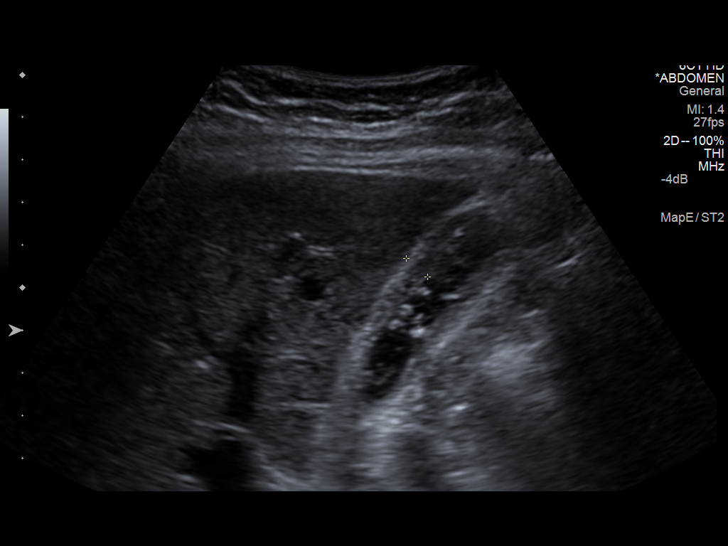
[im 25/55]
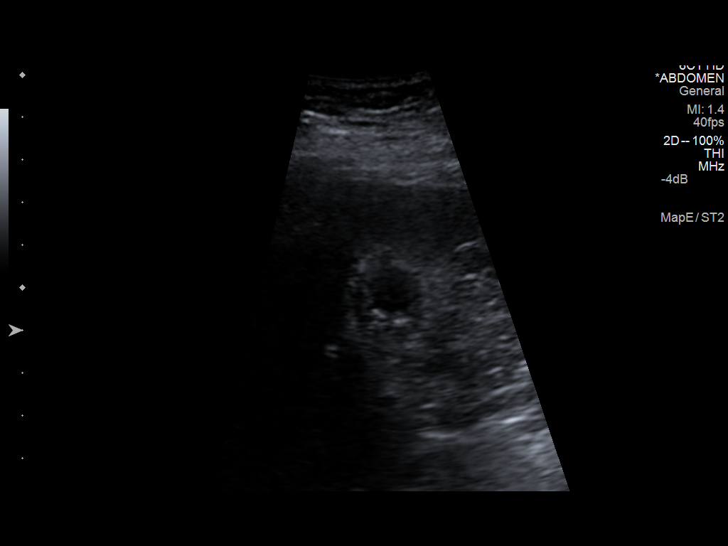
[im 30/55]
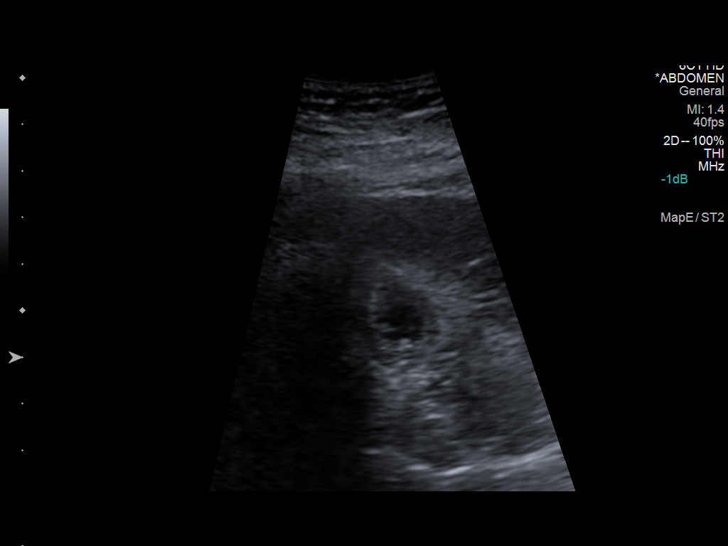
[im 34/55]
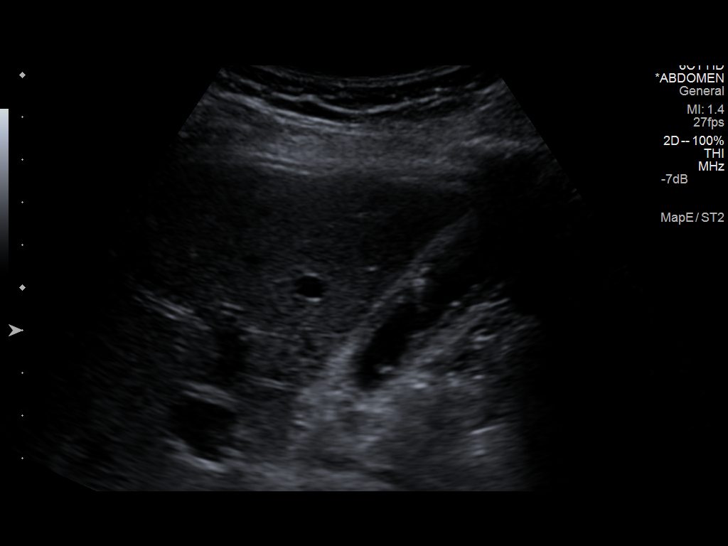
[im 37/55]
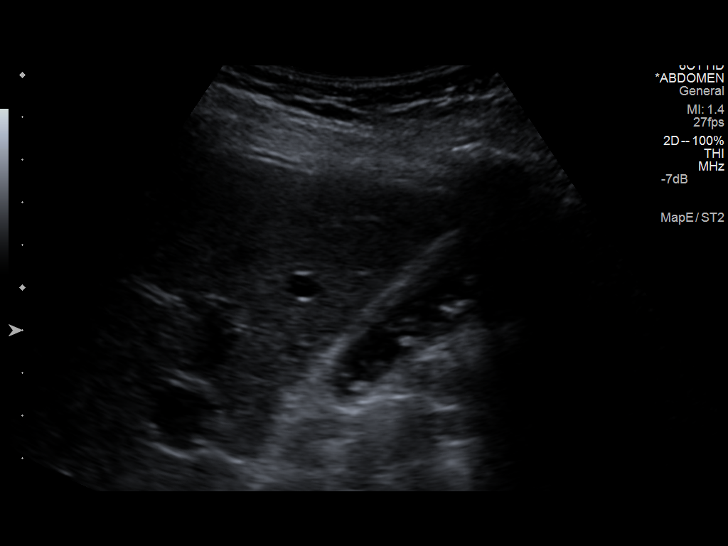
[im 41/55]
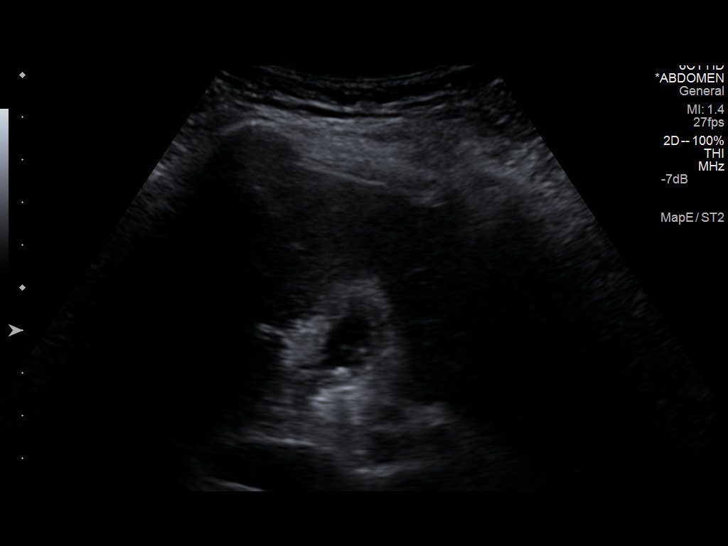
[im 46/55]
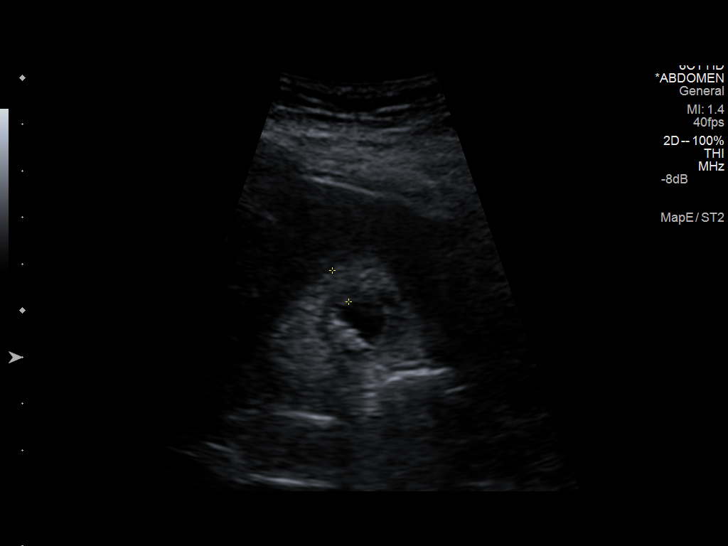
[im 50/55]
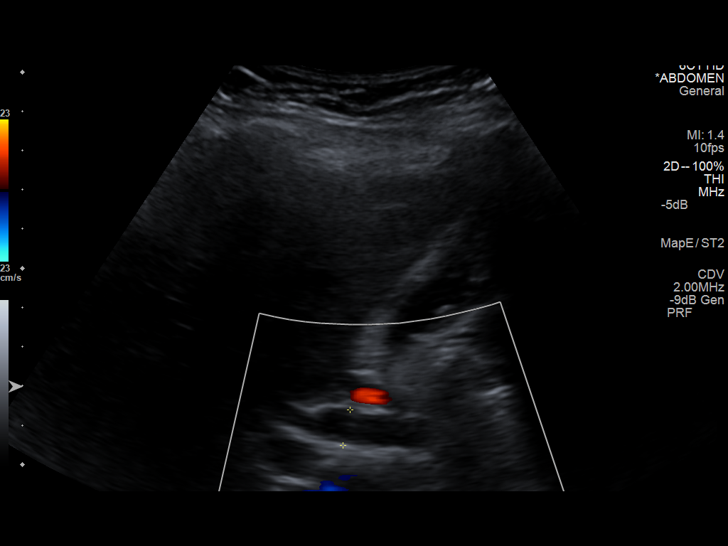
[im 55/55]
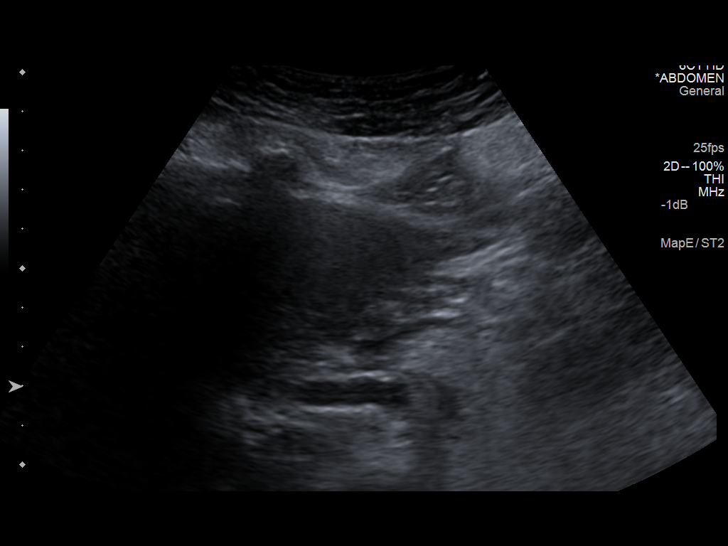

[14 of 25 positions shown; findings below may reference images not displayed]

FINDINGS: Gallbladder

There are numerous gallstones within the gallbladder. Some appear
nonmobile and anti dependent, and could represent sub cm polyps. The
wall is thickened to 7 mm, and contains echogenic foci with ring
down artifact. The gallbladder is not distended. There is reportedly
no sonographic Murphy sign.

Common bile duct

Diameter: 9 mm proximally. No evidence of biliary obstruction by
recent LFTs. Apparent shadowing at the pancreatic head is not
particularly dense, and there is no leading echogenic focus as would
be expected with choledocholithiasis.

Liver:

The echotexture of the liver is heterogeneous. No definite fatty
infiltration based on previous CT. No focal lesions seen. Antegrade
flow in the imaged portal venous system.
IMPRESSION: 1. Cholelithiasis.  Negative for acute cholecystitis.
2. Marked gallbladder wall thickening which is likely from
adenomyomatosis, underdistention, and possibly reactive edema.
3. 9 mm proximal common bile duct.

## 2014-10-09 IMAGING — CT CT ABD-PELV W/ CM
1 of 2 series · 15 of 32 positions shown, 19 images · IV contrast (100 ML OMNI 300)
Comparison: None.

CLINICAL DATA: Severe abdominal pain with nausea, vomiting,
diarrhea.

EXAM:
CT ABDOMEN AND PELVIS WITH CONTRAST
TECHNIQUE: Multidetector CT imaging of the abdomen and pelvis was performed
using the standard protocol following bolus administration of
intravenous contrast.
CONTRAST:  100mL OMNIPAQUE IOHEXOL 300 MG/ML  SOLN

[Series 2: abd/pel with · axial · 0.74mm/px · z∈[+1048,+1512]mm · 15 of 101 slices shown, 19 images]
[im 4/101  soft-tissue]
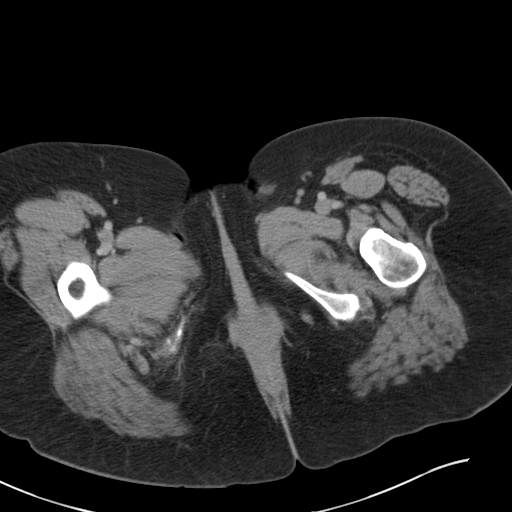
[im 4/101  bone]
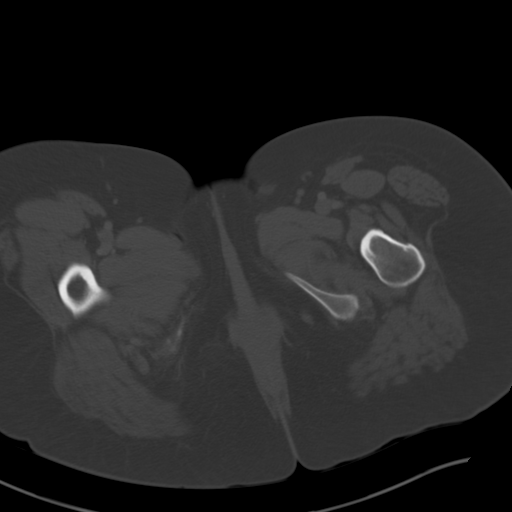
[im 12/101  soft-tissue]
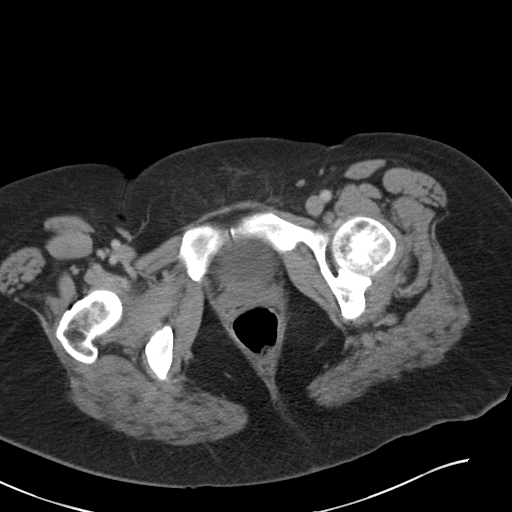
[im 20/101  soft-tissue]
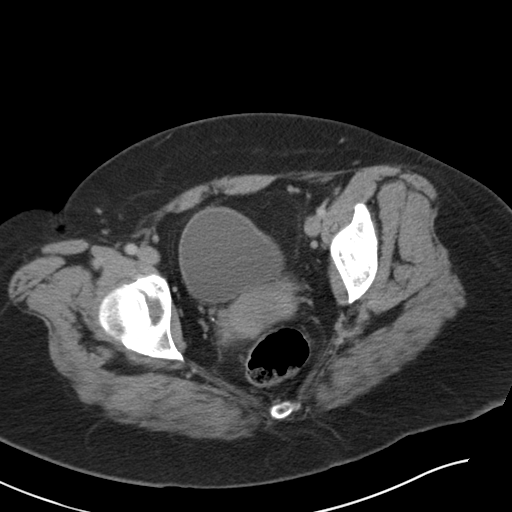
[im 27/101  soft-tissue]
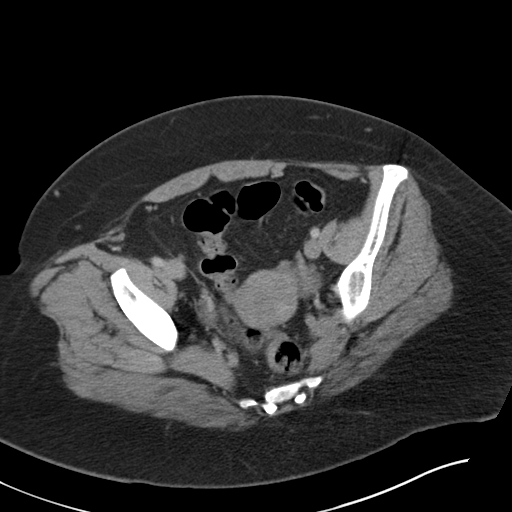
[im 35/101  soft-tissue]
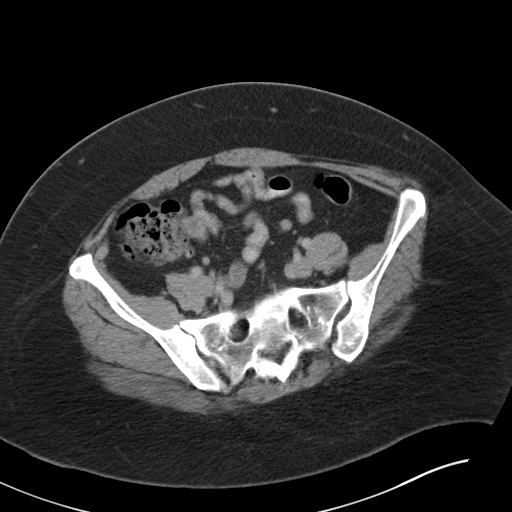
[im 43/101  soft-tissue]
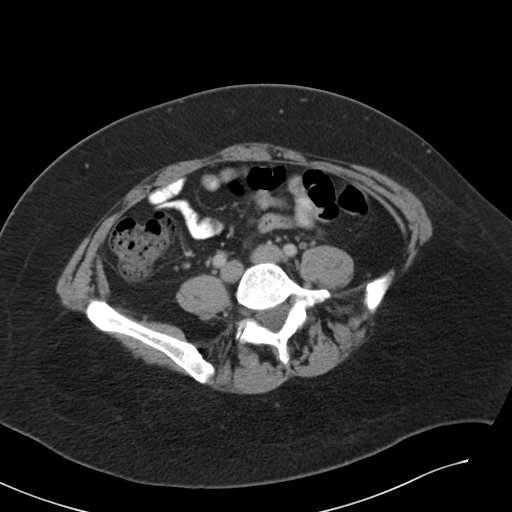
[im 51/101  soft-tissue]
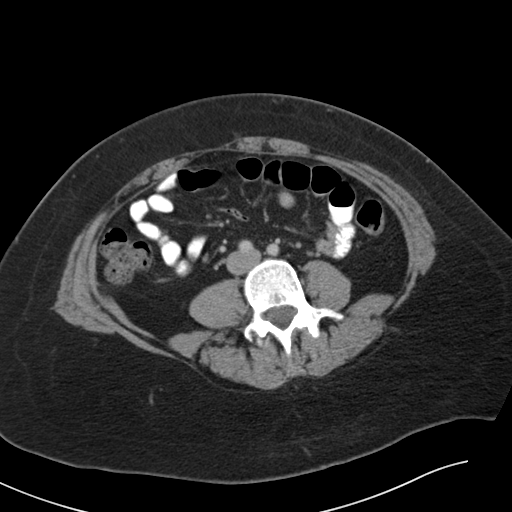
[im 58/101  soft-tissue]
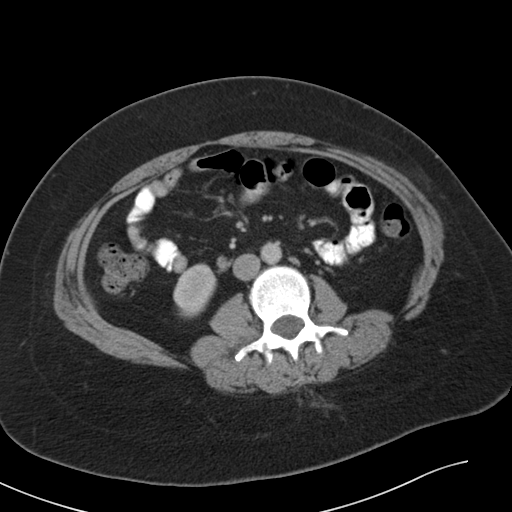
[im 66/101  soft-tissue]
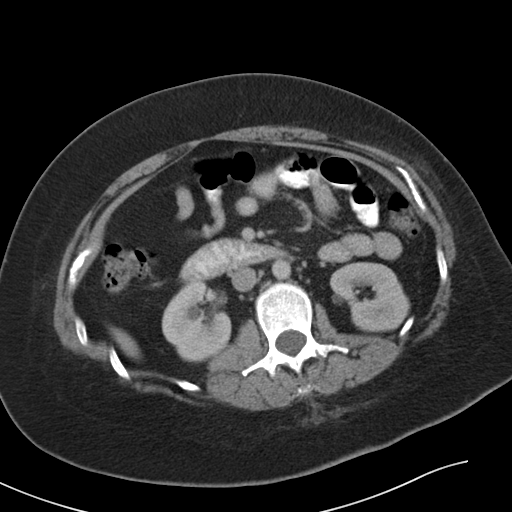
[im 66/101  bone]
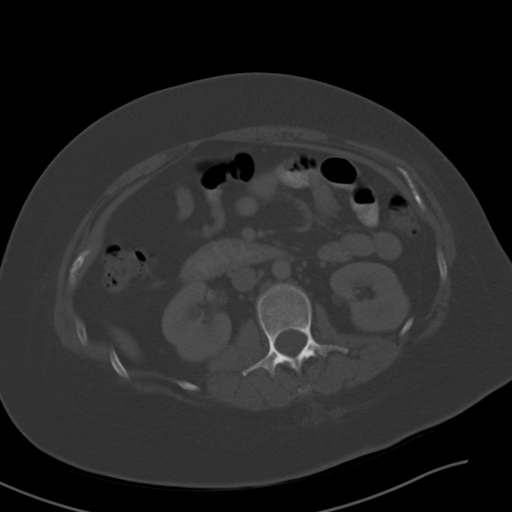
[im 74/101  soft-tissue]
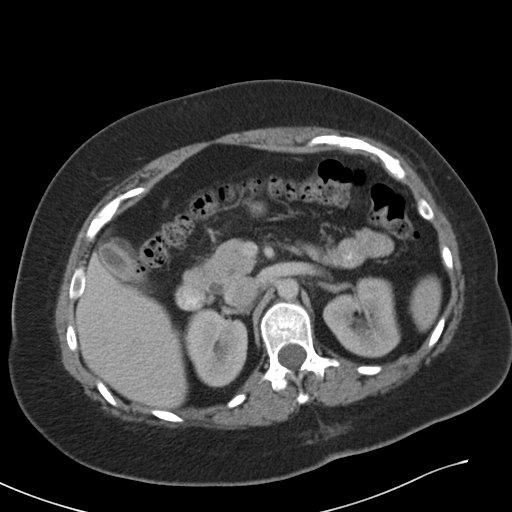
[im 81/101  soft-tissue]
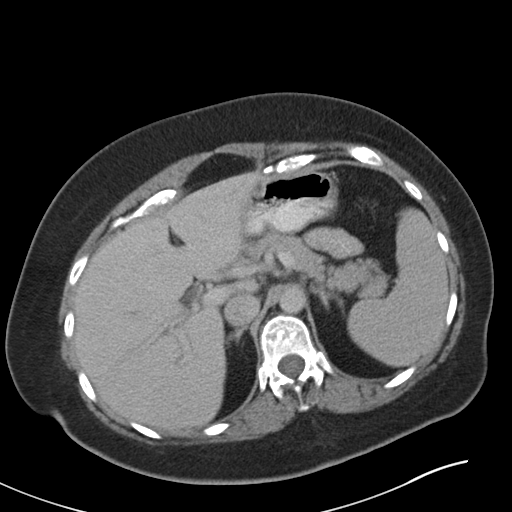
[im 85/101  lung]
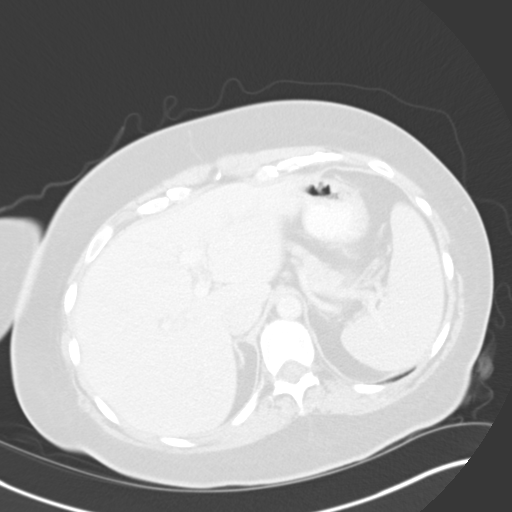
[im 89/101  soft-tissue]
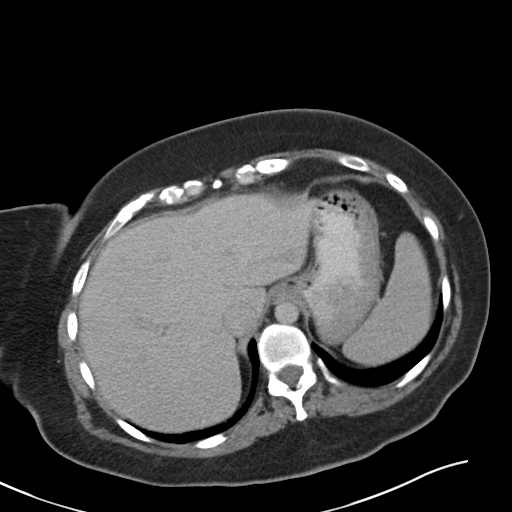
[im 89/101  lung]
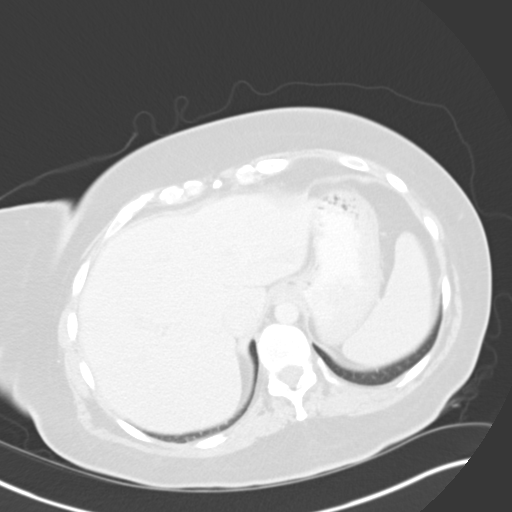
[im 93/101  lung]
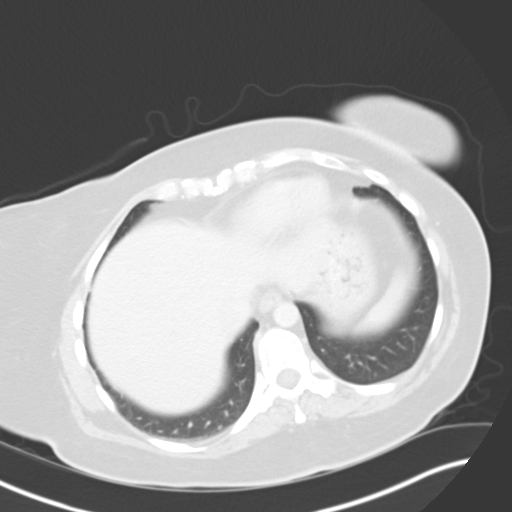
[im 97/101  soft-tissue]
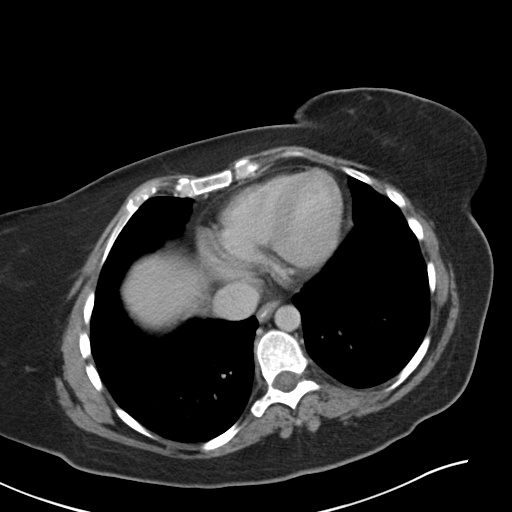
[im 97/101  lung]
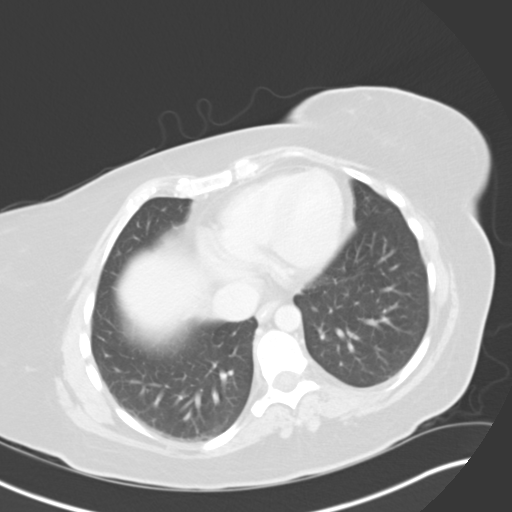

[15 of 32 positions shown; findings below may reference images not displayed]

FINDINGS: BODY WALL: Unremarkable.

LOWER CHEST: Unremarkable.

ABDOMEN/PELVIS:

Liver: Periportal mid a low-attenuation is likely a combination of
prominent biliary tree and periportal edema.

Biliary: Circumferential thickening of the gallbladder wall. There
are gallstones in the neck. No gallbladder distention to suggest
obstruction. Ductal structures in the pancreatic head appear
dilated, but when considering there is a low insertion of the cystic
duct, extending into the pancreatic head, individual ducts are not
dilated at 5 mm.

Pancreas: Unremarkable.

Spleen: Unremarkable.

Adrenals: Unremarkable.

Kidneys and ureters: No hydronephrosis or stone. Accessory right
renal artery from the common iliac.

Bladder: Unremarkable.

Reproductive: There appears to be multiple follicles/cyst in the
bilateral ovaries. No asymmetric ovarian enlargement.

Bowel: No obstruction. Normal appendix.

Retroperitoneum: No mass or adenopathy.

Peritoneum: No free fluid or gas.

Vascular: No acute abnormality.

OSSEOUS: Chronic appearing T12 superior endplate deformity.
IMPRESSION: 1. Mild periportal edema is often from volume resuscitation, but can
be seen with hepatitis. Correlate with comprehensive metabolic
panel.
2. Gallbladder wall thickening. Even though there are a gallstones,
this is most likely reactive given there is no gallbladder
distention to suggest cystic duct obstruction.
3. Low insertion of the cystic duct, joining the hepatic duct at the
pancreatic head.

## 2015-10-26 ENCOUNTER — Emergency Department (HOSPITAL_COMMUNITY)
Admission: EM | Admit: 2015-10-26 | Discharge: 2015-10-26 | Disposition: A | Discharge status: Home / Self Care | Payer: BC Managed Care – PPO | Attending: Emergency Medicine | Admitting: Emergency Medicine

## 2015-10-26 ENCOUNTER — Encounter (HOSPITAL_COMMUNITY): Payer: Self-pay | Admitting: Emergency Medicine

## 2015-10-26 DIAGNOSIS — Z7984 Long term (current) use of oral hypoglycemic drugs: Secondary | ICD-10-CM | POA: Diagnosis not present

## 2015-10-26 DIAGNOSIS — F329 Major depressive disorder, single episode, unspecified: Secondary | ICD-10-CM

## 2015-10-26 DIAGNOSIS — F17210 Nicotine dependence, cigarettes, uncomplicated: Secondary | ICD-10-CM | POA: Insufficient documentation

## 2015-10-26 DIAGNOSIS — E119 Type 2 diabetes mellitus without complications: Secondary | ICD-10-CM | POA: Diagnosis not present

## 2015-10-26 DIAGNOSIS — G4700 Insomnia, unspecified: Secondary | ICD-10-CM

## 2015-10-26 DIAGNOSIS — F32A Depression, unspecified: Secondary | ICD-10-CM

## 2015-10-26 MED ORDER — TRAZODONE HCL 50 MG PO TABS: 50.0000 mg | ORAL_TABLET | Freq: Every day | ORAL | 0 refills | Status: DC

## 2015-10-26 NOTE — ED Triage Notes (Signed)
Pt presents to the ED after having a conversation with her NP that told her to come to the ED for resources on getting some out patient follow up for insomnia and depression. Pt states one week ago today she had a thought about harming herself and and had a plan but after talking with her mother and her doctor pt has had no thoughts of hurting herself since last week. Pt reports being unable to sleep and feeling depressed. Mom is with pt for support. Pt is very agreeable denies any thoughts or hurting herself or others. Pt is calm.

## 2015-10-26 NOTE — ED Provider Notes (Signed)
[facility]-EMERGENCY DEPT Provider Note   CSN: [id number] Arrival date & time: 10/26/15  1226     History   Chief Complaint Chief Complaint  Patient presents with  . Insomnia  . Depression    HPI Wendy Maynard is a 40 y.o. female.  HPI   40 year old female with history of diabetes, anxiety, depression, PCOS presenting accompanied by mom for evaluation of depression and insomnia. Patient reports she has been feeling depressed and having difficulty sleeping for the past week and half. Patient admits that she has a history of depression. She is currently having increased menstrual period and attributed to her polycystic disease. She works for [employer] and states she is having a lot of complication dealing with her boss.  Sts that she was bullied at work by her boss.  Furthermore, Her menstrual bleeding is heavy, causing her to have to wear adult diaper.  She has difficulty concentrating at work and having trouble sleeping at home.  Sts she has a total of 15 hrs of sleep for the past 4 days.  Her stress is overwhelming, and last week she had a transient episode of suicidal ideation with plan to suffocate herself with helium. She however call and talk to her mom who is a retired Medical laboratory scientific officer. Mom is now with her and she went to her PCP today for further care. Her PCP suggest patient to seek for outpatient psychiatric help by going through the ER, and therefore prompted her to come here for further evaluation. At this time she is not suicidal, denies homicidal ideation, denies auditory or visual hallucination. She denies self medicating with alcohol or street drugs. She has been using her sleeping medication without adequate improvement. She is amenable for psychiatric help. She denies any other medication changes. She is a smoker.  Past Medical History:  Diagnosis Date  . Abdominal pain in female patient 11/2012  . Anxiety   . Depression   . Diabetes mellitus without complication (HCC)     . Migraines   . Polycystic ovarian disease     Patient Active Problem List   Diagnosis Date Noted  . Chronic calculous cholecystitis 03/23/2013  . Cholecystitis 02/26/2013    Past Surgical History:  Procedure Laterality Date  . CHOLECYSTECTOMY N/A 02/26/2013   Procedure: LAPAROSCOPIC CHOLECYSTECTOMY WITH INTRAOPERATIVE CHOLANGIOGRAM;  Surgeon: Adolph Pollack, MD;  Location: [facility];  Service: General;  Laterality: N/A;  . WISDOM TOOTH EXTRACTION      OB History    No data available       Home Medications    Prior to Admission medications   Medication Sig Start Date End Date Taking? Authorizing Provider  ALPRAZolam Prudy Feeler) 0.5 MG tablet Take 0.5 mg by mouth 3 (three) times daily as needed. 02/11/13   Historical Provider, MD  CARAFATE 1 GM/10ML suspension Take 1 g by mouth 4 (four) times daily.  02/16/13   Historical Provider, MD  DULoxetine (CYMBALTA) 60 MG capsule Take 60 mg by mouth daily.    Historical Provider, MD  metFORMIN (GLUCOPHAGE) 500 MG tablet Take 500 mg by mouth 2 (two) times daily with a meal.    Historical Provider, MD  NEXIUM 40 MG capsule Take 40 mg by mouth daily. 02/16/13   Historical Provider, MD  oxyCODONE (OXY IR/ROXICODONE) 5 MG immediate release tablet Take 1-2 tablets (5-10 mg total) by mouth every 4 (four) hours as needed. 02/26/13   Avel Peace, MD  oxyCODONE (OXY IR/ROXICODONE) 5 MG immediate release tablet Take  1-2 tablets (5-10 mg total) by mouth every 4 (four) hours as needed for severe pain. 02/27/13   Ovidio Kinavid Newman, MD  traMADol-acetaminophen (ULTRACET) 37.5-325 MG per tablet Take 1 tablet by mouth every 6 (six) hours as needed for moderate pain.  12/31/12   Historical Provider, MD  zolpidem (AMBIEN) 10 MG tablet Take 10 mg by mouth at bedtime as needed.  02/11/13   Historical Provider, MD    Family History No family history on file.  Social History Social History  Substance Use Topics  . Smoking status: Current Every Day Smoker    Packs/day:  0.50    Years: 8.00    Types: Cigarettes  . Smokeless tobacco: Not on file  . Alcohol use Yes     Comment: denies, empty bottle at home per EMS     Allergies   Ciprofloxacin; Penicillins; Vicodin [hydrocodone-acetaminophen]; and Sulfa antibiotics   Review of Systems Review of Systems  All other systems reviewed and are negative.    Physical Exam Updated Vital Signs BP 116/72 (BP Location: Right Arm)   Pulse 86   Temp 98.3 F (36.8 C) (Oral)   Resp 18   Ht 5\' 7"  (1.702 m)   Wt 97.1 kg   LMP 10/26/2015   SpO2 99%   BMI 33.52 kg/m   Physical Exam  Constitutional: She is oriented to person, place, and time. She appears well-developed and well-nourished. No distress.  Patient is tearful in the room, and having bouts of crying spell but consolable.  HENT:  Head: Atraumatic.  Eyes: Conjunctivae are normal.  Neck: Neck supple.  Cardiovascular: Normal rate and regular rhythm.   Pulmonary/Chest: Effort normal and breath sounds normal.  Abdominal: Soft. There is no tenderness.  Neurological: She is alert and oriented to person, place, and time. GCS eye subscore is 4. GCS verbal subscore is 5. GCS motor subscore is 6.  Skin: No rash noted.  Psychiatric: Her speech is normal. Judgment normal. She is withdrawn. Thought content is not paranoid. Cognition and memory are normal. She exhibits a depressed mood. She expresses no homicidal and no suicidal ideation.  Nursing note and vitals reviewed.    ED Treatments / Results  Labs (all labs ordered are listed, but only abnormal results are displayed) Labs Reviewed - No data to display  EKG  EKG Interpretation None       Radiology No results found.  Procedures Procedures (including critical care time)  Medications Ordered in ED Medications - No data to display   Initial Impression / Assessment and Plan / ED Course  I have reviewed the triage vital signs and the nursing notes.  Pertinent labs & imaging results  that were available during my care of the patient were reviewed by me and considered in my medical decision making (see chart for details).  Clinical Course    BP 155/96   Pulse 88   Temp 98.3 F (36.8 C) (Oral)   Resp 16   Ht 5\' 7"  (1.702 m)   Wt 97.1 kg   LMP 10/26/2015   SpO2 100%   BMI 33.52 kg/m    Final Clinical Impressions(s) / ED Diagnoses   Final diagnoses:  Depression  Insomnia    New Prescriptions New Prescriptions   No medications on file   3:57 PM Patient is requesting for outpatient psychiatric help for her increased depression and passive suicidal ideation. She is not actively suicidal. She has very good home support, with her mother by her side. She  mentioned trazodone has helped her with sleeping in the past. She planned to quit her job, and follow-up with mental health facility to help with her symptom. She also plan to have a hysterectomy as further treatment of her PCOS.  I felt the patient is a reliable historian. I do not think patient will attempt to harm herself with sleeping medication. I will provide a short course of trazodone.  Outpt resources provided.  Return precaution discussed.  Pt is stable for discharge.     Fayrene Helper, PA-C 10/26/15 1603    Laurence Spates, MD 10/30/15 [phone]

## 2015-12-04 ENCOUNTER — Other Ambulatory Visit: Payer: Self-pay | Admitting: Obstetrics and Gynecology

## 2016-04-28 ENCOUNTER — Encounter (HOSPITAL_COMMUNITY): Payer: Self-pay | Admitting: Behavioral Health

## 2016-04-28 ENCOUNTER — Inpatient Hospital Stay (HOSPITAL_COMMUNITY)
Admission: AD | Admit: 2016-04-28 | Discharge: 2016-05-02 | DRG: 885 | Disposition: A | Discharge status: Home / Self Care | Payer: BC Managed Care – PPO | Attending: Psychiatry | Admitting: Psychiatry

## 2016-04-28 DIAGNOSIS — F17210 Nicotine dependence, cigarettes, uncomplicated: Secondary | ICD-10-CM | POA: Diagnosis present

## 2016-04-28 DIAGNOSIS — F909 Attention-deficit hyperactivity disorder, unspecified type: Secondary | ICD-10-CM | POA: Diagnosis present

## 2016-04-28 DIAGNOSIS — F332 Major depressive disorder, recurrent severe without psychotic features: Secondary | ICD-10-CM | POA: Diagnosis present

## 2016-04-28 DIAGNOSIS — Z23 Encounter for immunization: Secondary | ICD-10-CM

## 2016-04-28 DIAGNOSIS — E119 Type 2 diabetes mellitus without complications: Secondary | ICD-10-CM | POA: Diagnosis present

## 2016-04-28 DIAGNOSIS — Z79899 Other long term (current) drug therapy: Secondary | ICD-10-CM | POA: Diagnosis not present

## 2016-04-28 DIAGNOSIS — R45851 Suicidal ideations: Secondary | ICD-10-CM | POA: Diagnosis present

## 2016-04-28 DIAGNOSIS — G4700 Insomnia, unspecified: Secondary | ICD-10-CM | POA: Diagnosis present

## 2016-04-28 DIAGNOSIS — Z79891 Long term (current) use of opiate analgesic: Secondary | ICD-10-CM | POA: Diagnosis not present

## 2016-04-28 DIAGNOSIS — Z7984 Long term (current) use of oral hypoglycemic drugs: Secondary | ICD-10-CM | POA: Diagnosis not present

## 2016-04-28 DIAGNOSIS — Z818 Family history of other mental and behavioral disorders: Secondary | ICD-10-CM | POA: Diagnosis not present

## 2016-04-28 LAB — COMPREHENSIVE METABOLIC PANEL
ALT: 18 U/L (ref 14–54)
ANION GAP: 9 (ref 5–15)
AST: 21 U/L (ref 15–41)
Albumin: 4.6 g/dL (ref 3.5–5.0)
Alkaline Phosphatase: 67 U/L (ref 38–126)
BUN: 14 mg/dL (ref 6–20)
CALCIUM: 9.5 mg/dL (ref 8.9–10.3)
CHLORIDE: 107 mmol/L (ref 101–111)
CO2: 25 mmol/L (ref 22–32)
CREATININE: 0.68 mg/dL (ref 0.44–1.00)
Glucose, Bld: 90 mg/dL (ref 65–99)
Potassium: 3.7 mmol/L (ref 3.5–5.1)
SODIUM: 141 mmol/L (ref 135–145)
Total Bilirubin: 0.5 mg/dL (ref 0.3–1.2)
Total Protein: 8.3 g/dL — ABNORMAL HIGH (ref 6.5–8.1)

## 2016-04-28 LAB — CBC
HCT: 37.7 % (ref 36.0–46.0)
Hemoglobin: 12.5 g/dL (ref 12.0–15.0)
MCH: 27.1 pg (ref 26.0–34.0)
MCHC: 33.2 g/dL (ref 30.0–36.0)
MCV: 81.8 fL (ref 78.0–100.0)
PLATELETS: 272 10*3/uL (ref 150–400)
RBC: 4.61 MIL/uL (ref 3.87–5.11)
RDW: 13.8 % (ref 11.5–15.5)
WBC: 9.4 10*3/uL (ref 4.0–10.5)

## 2016-04-28 LAB — TSH: TSH: 2.52 u[IU]/mL (ref 0.350–4.500)

## 2016-04-28 MED ORDER — ACETAMINOPHEN 325 MG PO TABS
650.0000 mg | ORAL_TABLET | Freq: Four times a day (QID) | ORAL | Status: DC | PRN
  Administered 2016-04-30 – 2016-05-01 (×2): 650 mg via ORAL
  Filled 2016-04-28 (×2): qty 2

## 2016-04-28 MED ORDER — TRAZODONE HCL 50 MG PO TABS
50.0000 mg | ORAL_TABLET | Freq: Every evening | ORAL | Status: DC | PRN
  Administered 2016-04-28 – 2016-05-01 (×4): 50 mg via ORAL
  Filled 2016-04-28 (×4): qty 1

## 2016-04-28 MED ORDER — MAGNESIUM HYDROXIDE 400 MG/5ML PO SUSP: 30.0000 mL | Freq: Every day | ORAL | Status: DC | PRN

## 2016-04-28 MED ORDER — ALUM & MAG HYDROXIDE-SIMETH 200-200-20 MG/5ML PO SUSP: 30.0000 mL | ORAL | Status: DC | PRN

## 2016-04-28 MED ORDER — HYDROXYZINE HCL 25 MG PO TABS
25.0000 mg | ORAL_TABLET | Freq: Four times a day (QID) | ORAL | Status: DC | PRN
  Administered 2016-04-29 – 2016-05-02 (×7): 25 mg via ORAL
  Filled 2016-04-28 (×7): qty 1

## 2016-04-28 MED ORDER — INFLUENZA VAC SPLIT QUAD 0.5 ML IM SUSY
0.5000 mL | PREFILLED_SYRINGE | INTRAMUSCULAR | Status: AC
  Administered 2016-04-29: 0.5 mL via INTRAMUSCULAR
  Filled 2016-04-28: qty 0.5

## 2016-04-28 MED ORDER — PNEUMOCOCCAL VAC POLYVALENT 25 MCG/0.5ML IJ INJ
0.5000 mL | INJECTION | INTRAMUSCULAR | Status: AC
  Administered 2016-04-29: 0.5 mL via INTRAMUSCULAR

## 2016-04-28 NOTE — Progress Notes (Addendum)
Patient ID: Wendy Maynard, female   DOB: 1975-12-17, 41 y.o.   MRN: [id number]   Wendy Maynard is a 41 yo single very nervous and anxious female who [facility]. She says " I can't take this any more and I've gotta get some help". She reports having depression and ADHD " for years" and shares " they have been adjusting my medications ..but I don't think its working". She says she is expeirinecing positive suicdal thoughts currently, but she is able to contract for safety with this nurse. She does says there is mental illness in her family, but denies known suide in her family, denies history of drug abuse and re[rots most recently ( Dec 2017) she had a complete hysterectomy and " now I have to take estrogen every day". She does smoke cigarettes, is not interested in quitting, denies history of drug and / or alcohol abuse. After admission assessment is completed, pt is oriented to the unit and admission entered in computer by Clinical research associatewriter.

## 2016-04-28 NOTE — H&P (Signed)
Behavioral Health Medical Screening Exam  Wendy Maynard is an 41 y.o. female.  Total Time spent with patient: 20 minutes  Psychiatric Specialty Exam: Physical Exam  Constitutional: She is oriented to person, place, and time. She appears well-developed and well-nourished.  HENT:  Head: Normocephalic.  Right Ear: External ear normal.  Left Ear: External ear normal.  Eyes: Conjunctivae and EOM are normal.  Neck: Normal range of motion. Neck supple.  Cardiovascular: Normal rate and normal heart sounds.   Respiratory: Effort normal and breath sounds normal.  GI: Soft. Bowel sounds are normal.  Musculoskeletal: Normal range of motion.  Neurological: She is alert and oriented to person, place, and time.  Skin: Skin is warm and dry.    Review of Systems  Psychiatric/Behavioral: Positive for depression and suicidal ideas. Negative for hallucinations, memory loss and substance abuse. The patient has insomnia. The patient is not nervous/anxious.   All other systems reviewed and are negative.   Blood pressure 137/84, pulse 68, temperature 98.9 F (37.2 C), temperature source Oral, resp. rate 18, SpO2 98 %.There is no height or weight on file to calculate BMI.  General Appearance: Casual and Fairly Groomed  Eye Contact:  Good  Speech:  Clear and Coherent and Slow  Volume:  Decreased  Mood:  Depressed  Affect:  Congruent, Depressed and Flat  Thought Process:  Coherent and Linear  Orientation:  Full (Time, Place, and Person)  Thought Content:  Logical  Suicidal Thoughts:  Yes.  with intent/plan  Homicidal Thoughts:  No  Memory:  Immediate;   Good Recent;   Good Remote;   Fair  Judgement:  Fair  Insight:  Fair  Psychomotor Activity:  Normal  Concentration: Concentration: Good and Attention Span: Good  Recall:  Good  Fund of Knowledge:Good  Language: Good  Akathisia:  No  Handed:  Right  AIMS (if indicated):     Assets:  Communication Skills Desire for Improvement Financial  Resources/Insurance Housing Physical Health Resilience Social Support Talents/Skills Vocational/Educational  Sleep:       Musculoskeletal: Strength & Muscle Tone: within normal limits Gait & Station: normal Patient leans: N/A  Blood pressure 137/84, pulse 68, temperature 98.9 F (37.2 C), temperature source Oral, resp. rate 18, SpO2 98 %.  Recommendations:  Based on my evaluation the patient does not appear to have an emergency medical condition.  Wendy AbbeLaurie Britton Finlee Milo, NP 04/28/2016, 5:12 PM
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
## 2016-04-28 NOTE — Tx Team (Signed)
Initial Treatment Plan 04/28/2016 7:09 PM Wendy Maynard ZOX:[id number]    PATIENT STRESSORS: Educational concerns Financial difficulties Health problems Legal issue   PATIENT STRENGTHS: Ability for insight Active sense of humor Average or above average intelligence   PATIENT IDENTIFIED PROBLEMS: MDD  Suicdality            " Im a good person"  " I don't do drugs"     DISCHARGE CRITERIA:  Ability to meet basic life and health needs Adequate post-discharge living arrangements Improved stabilization in mood, thinking, and/or behavior Medical problems require only outpatient monitoring  PRELIMINARY DISCHARGE PLAN: Attend PHP/IOP Attend 12-step recovery group  PATIENT/FAMILY INVOLVEMENT: This treatment plan has been presented to and reviewed with the patient, Wendy Maynard, and/or family member,.  The patient and family have been given the opportunity to ask questions and make suggestions.  Rich Braveuke, Euline Kimbler Lynn, RN 04/28/2016, 7:09 PM

## 2016-04-28 NOTE — Progress Notes (Signed)
Patient ID: Wendy Maynard, female   DOB: 1975/06/20, 41 y.o.   MRN: [id number]  Pt currently presents with a flat affect and guarded behavior. Pt reports to writer that their goal is to "get my bible and chapstick." Pt states "they comfort me." Pt reports poor sleep PTA, adheres with current medication regimen. Pt interacts minimally with peers, remains in her room most of the night.   Pt provided with medications per providers orders. Pt's labs and vitals were monitored throughout the night. Pt given a 1:1 about emotional and mental status. Pt supported and encouraged to express concerns and questions. Pt educated on medications. Personal bible searched and brought back for pt.   Pt's safety ensured with 15 minute and environmental checks. Pt currently denies SI/HI and A/V hallucinations. Pt verbally agrees to seek staff if SI/HI or A/VH occurs and to consult with staff before acting on any harmful thoughts. Will continue POC.

## 2016-04-29 DIAGNOSIS — F332 Major depressive disorder, recurrent severe without psychotic features: Principal | ICD-10-CM

## 2016-04-29 DIAGNOSIS — Z79891 Long term (current) use of opiate analgesic: Secondary | ICD-10-CM

## 2016-04-29 DIAGNOSIS — Z818 Family history of other mental and behavioral disorders: Secondary | ICD-10-CM

## 2016-04-29 LAB — URINALYSIS, ROUTINE W REFLEX MICROSCOPIC
BILIRUBIN URINE: NEGATIVE
Bacteria, UA: NONE SEEN
Glucose, UA: NEGATIVE mg/dL
Hgb urine dipstick: NEGATIVE
KETONES UR: NEGATIVE mg/dL
Nitrite: NEGATIVE
PH: 5 (ref 5.0–8.0)
Protein, ur: NEGATIVE mg/dL
SPECIFIC GRAVITY, URINE: 1.017 (ref 1.005–1.030)

## 2016-04-29 MED ORDER — ESTRADIOL 1 MG PO TABS
1.0000 mg | ORAL_TABLET | Freq: Every day | ORAL | Status: DC
  Administered 2016-04-29 – 2016-05-02 (×4): 1 mg via ORAL
  Filled 2016-04-29 (×6): qty 1

## 2016-04-29 MED ORDER — ARIPIPRAZOLE 2 MG PO TABS
2.0000 mg | ORAL_TABLET | Freq: Every day | ORAL | Status: DC
  Administered 2016-04-29 – 2016-05-02 (×4): 2 mg via ORAL
  Filled 2016-04-29 (×7): qty 1

## 2016-04-29 MED ORDER — SERTRALINE HCL 100 MG PO TABS
100.0000 mg | ORAL_TABLET | Freq: Every day | ORAL | Status: DC
  Administered 2016-04-29 – 2016-05-02 (×4): 100 mg via ORAL
  Filled 2016-04-29 (×7): qty 1

## 2016-04-29 NOTE — Tx Team (Signed)
Interdisciplinary Treatment and Diagnostic Plan Update  04/29/2016 Time of Session: 9:30am Wendy Maynard MRN: [id number]  Principal Diagnosis: Major Depression, Recurrent, no Psychotic Features  Secondary Diagnoses: Active Problems:   MDD (major depressive disorder), recurrent severe, without psychosis ([place])   Current Medications:  Current Facility-Administered Medications  Medication Dose Route Frequency Provider Last Rate Last Dose  . acetaminophen (TYLENOL) tablet 650 mg  650 mg Oral Q6H PRN Ethelene Hal, NP      . alum & mag hydroxide-simeth (MAALOX/MYLANTA) 200-200-20 MG/5ML suspension 30 mL  30 mL Oral Q4H PRN Ethelene Hal, NP      . ARIPiprazole (ABILIFY) tablet 2 mg  2 mg Oral Daily Myer Peer Cobos, MD      . hydrOXYzine (ATARAX/VISTARIL) tablet 25 mg  25 mg Oral Q6H PRN Ethelene Hal, NP   25 mg at 04/29/16 0731  . magnesium hydroxide (MILK OF MAGNESIA) suspension 30 mL  30 mL Oral Daily PRN Ethelene Hal, NP      . sertraline (ZOLOFT) tablet 100 mg  100 mg Oral Daily Jenne Campus, MD      . traZODone (DESYREL) tablet 50 mg  50 mg Oral QHS PRN Ethelene Hal, NP   50 mg at 04/28/16 2136    PTA Medications: Prescriptions Prior to Admission  Medication Sig Dispense Refill Last Dose  . ALPRAZolam (XANAX) 0.5 MG tablet Take 0.5 mg by mouth 3 (three) times daily as needed.   Unknown at Unknown time  . CARAFATE 1 GM/10ML suspension Take 1 g by mouth 4 (four) times daily.    Unknown at Unknown time  . DULoxetine (CYMBALTA) 60 MG capsule Take 60 mg by mouth daily.   Unknown at Unknown time  . metFORMIN (GLUCOPHAGE) 500 MG tablet Take 500 mg by mouth 2 (two) times daily with a meal.   Unknown at Unknown time  . NEXIUM 40 MG capsule Take 40 mg by mouth daily.   Unknown at Unknown time  . oxyCODONE (OXY IR/ROXICODONE) 5 MG immediate release tablet Take 1-2 tablets (5-10 mg total) by mouth every 4 (four) hours as needed. 30 tablet 0 Unknown at Unknown  time  . oxyCODONE (OXY IR/ROXICODONE) 5 MG immediate release tablet Take 1-2 tablets (5-10 mg total) by mouth every 4 (four) hours as needed for severe pain. 30 tablet 0 Unknown at Unknown time  . traMADol-acetaminophen (ULTRACET) 37.5-325 MG per tablet Take 1 tablet by mouth every 6 (six) hours as needed for moderate pain.    Unknown at Unknown time  . traZODone (DESYREL) 50 MG tablet Take 1 tablet (50 mg total) by mouth at bedtime. 15 tablet 0 Unknown at Unknown time    Treatment Modalities: Medication Management, Group therapy, Case management,  1 to 1 session with clinician, Psychoeducation, Recreational therapy.  Patient Stressors: Network engineer difficulties Health problems Legal issue  Patient Strengths: Ability for insight Active sense of humor Average or above average intelligence  Physician Treatment Plan for Primary Diagnosis: Major Depression, Recurrent, no Psychotic Features Long Term Goal(s): Improvement in symptoms so as ready for discharge  Short Term Goals: Ability to disclose and discuss suicidal ideas Ability to demonstrate self-control will improve Ability to identify and develop effective coping behaviors will improve Ability to maintain clinical measurements within normal limits will improve Ability to disclose and discuss suicidal ideas Ability to demonstrate self-control will improve Ability to identify and develop effective coping behaviors will improve Ability to maintain clinical measurements within normal limits  will improve  Medication Management: Evaluate patient's response, side effects, and tolerance of medication regimen.  Therapeutic Interventions: 1 to 1 sessions, Unit Group sessions and Medication administration.  Evaluation of Outcomes: Not Met  Physician Treatment Plan for Secondary Diagnosis: Active Problems:   MDD (major depressive disorder), recurrent severe, without psychosis ([place])   Long Term Goal(s): Improvement in  symptoms so as ready for discharge  Short Term Goals: Ability to disclose and discuss suicidal ideas Ability to demonstrate self-control will improve Ability to identify and develop effective coping behaviors will improve Ability to maintain clinical measurements within normal limits will improve Ability to disclose and discuss suicidal ideas Ability to demonstrate self-control will improve Ability to identify and develop effective coping behaviors will improve Ability to maintain clinical measurements within normal limits will improve  Medication Management: Evaluate patient's response, side effects, and tolerance of medication regimen.  Therapeutic Interventions: 1 to 1 sessions, Unit Group sessions and Medication administration.  Evaluation of Outcomes: Not Met   RN Treatment Plan for Primary Diagnosis: Major Depression, Recurrent, no Psychotic Features Long Term Goal(s): Knowledge of disease and therapeutic regimen to maintain health will improve  Short Term Goals: Ability to verbalize feelings will improve, Ability to disclose and discuss suicidal ideas and Ability to identify and develop effective coping behaviors will improve  Medication Management: RN will administer medications as ordered by provider, will assess and evaluate patient's response and provide education to patient for prescribed medication. RN will report any adverse and/or side effects to prescribing provider.  Therapeutic Interventions: 1 on 1 counseling sessions, Psychoeducation, Medication administration, Evaluate responses to treatment, Monitor vital signs and CBGs as ordered, Perform/monitor CIWA, COWS, AIMS and Fall Risk screenings as ordered, Perform wound care treatments as ordered.  Evaluation of Outcomes: Not Met   LCSW Treatment Plan for Primary Diagnosis: Major Depression, Recurrent, no Psychotic Features Long Term Goal(s): Safe transition to appropriate next level of care at discharge, Engage patient  in therapeutic group addressing interpersonal concerns.  Short Term Goals: Engage patient in aftercare planning with referrals and resources, Identify triggers associated with mental health/substance abuse issues and Increase skills for wellness and recovery  Therapeutic Interventions: Assess for all discharge needs, 1 to 1 time with Social worker, Explore available resources and support systems, Assess for adequacy in community support network, Educate family and significant other(s) on suicide prevention, Complete Psychosocial Assessment, Interpersonal group therapy.  Evaluation of Outcomes: Not Met   Progress in Treatment: Attending groups: Pt is new to milieu, continuing to assess  Participating in groups: Pt is new to milieu, continuing to assess  Taking medication as prescribed: Yes, MD continues to assess for medication changes as needed Toleration medication: Yes, no side effects reported at this time Family/Significant other contact made: No, CSW assessing for appropriate contact Patient understands diagnosis: Continuing to assess Discussing patient identified problems/goals with staff: Yes Medical problems stabilized or resolved: Yes Denies suicidal/homicidal ideation: No, recently admitted with SI Issues/concerns per patient self-inventory: None Other: N/A  New problem(s) identified: None identified at this time.   New Short Term/Long Term Goal(s): None identified at this time.   Discharge Plan or Barriers: Pt will return home and follow-up with outpatient services at [place]  Reason for Continuation of Hospitalization: Anxiety Depression Medication stabilization Suicidal ideation  Estimated Length of Stay: 3-5 days  Attendees: Patient: 04/29/2016  11:26 AM  Physician: Dr. Parke Poisson 04/29/2016  11:26 AM  Nursing: Merlene Morse, RN 04/29/2016  11:26 AM  RN Care Manager:  Lars Pinks, RN 04/29/2016  11:26 AM  Social Worker: Adriana Reams, LCSW 04/29/2016   11:26 AM  Recreational Therapist:  04/29/2016  11:26 AM  Other: Lindell Spar, NP; Ricky Ala, NP 04/29/2016  11:26 AM  Other:  04/29/2016  11:26 AM  Other: 04/29/2016  11:26 AM    Scribe for Treatment Team: Gladstone Lighter, LCSW 04/29/2016 11:26 AM
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
## 2016-04-29 NOTE — Plan of Care (Signed)
Problem: Activity: Goal: Sleeping patterns will improve Outcome: Progressing Pt slept 6.5 hours last night according to flowsheet.  She reports that the PRN Trazodone she took was helpful.
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
## 2016-04-29 NOTE — Progress Notes (Signed)
EKG COMPLETED AND PUT ON MD DESK FOR REVIEW.  

## 2016-04-29 NOTE — Plan of Care (Signed)
Problem: Education: Goal: Utilization of techniques to improve thought processes will improve Outcome: Progressing Nurse discussed depression/anxiety/coping skills with patient.    

## 2016-04-29 NOTE — [facility] Suicide Risk Assessment (Signed)
[facility] INPATIENT:  Family/Significant Other Suicide Prevention Education  Suicide Prevention Education:  Patient Refusal for Family/Significant Other Suicide Prevention Education: The patient Wendy Maynard has refused to provide written consent for family/significant other to be provided Family/Significant Other Suicide Prevention Education during admission and/or prior to discharge.  Physician notified.  Verdene LennertLauren C Farran Amsden 04/29/2016, 3:43 PM

## 2016-04-29 NOTE — Progress Notes (Signed)
Adult Psychoeducational Group Note  Date:  04/29/2016 Time:  9:14 PM  Group Topic/Focus:  Wrap-Up Group:   The focus of this group is to help patients review their daily goal of treatment and discuss progress on daily workbooks.  Participation Level:  Active  Participation Quality:  Appropriate  Affect:  Appropriate  Cognitive:  Alert  Insight: Appropriate  Engagement in Group:  Engaged  Modes of Intervention:  Discussion  Additional Comments:  Patient rates his day a 5. Patient's goal for today was to get through the day.   Oluwatoni Rotunno L Lakeesha Fontanilla 04/29/2016, 9:14 PM

## 2016-04-29 NOTE — Progress Notes (Signed)
Recreation Therapy Notes  Date: 04/29/16 Time: 0930 Location: [address] Dayroom  Group Topic: Stress Management  Goal Area(s) Addresses:  Patient will verbalize importance of using healthy stress management.  Patient will identify positive emotions associated with healthy stress management.   Intervention: Stress Management  Activity :  Stress Meditation.  LRT introduced the stress management technique of meditation.  LRT played a meditation on stress management from the Calm app to engage in the meditation.  Patients were to follow along as the meditation was played to partake in the meditation.  Education:  Stress Management, Discharge Planning.   Education Outcome: Acknowledges edcuation/In group clarification offered/Needs additional education  Clinical Observations/Feedback: Pt did not attend group.    Travaris Kosh, LRT/CTRS         Sybella Harnish A 04/29/2016 11:38 AM 

## 2016-04-29 NOTE — BHH Suicide Risk Assessment (Signed)
[facility]   Nursing information obtained from:  Patient Demographic factors:  Caucasian, Low socioeconomic status, Unemployed Current Mental Status:  Suicidal ideation indicated by patient Loss Factors:  Decline in physical health Historical Factors:  Impulsivity Risk Reduction Factors:  Sense of responsibility to family  Total Time spent with patient: 45 minutes Principal Problem:  MDD  Diagnosis:   Patient Active Problem List   Diagnosis Date Noted  . MDD (major depressive disorder), recurrent severe, without psychosis (HCC) [F33.2] 04/28/2016  . Chronic calculous cholecystitis [K80.10] 03/23/2013  . Cholecystitis [K81.9] 02/26/2013    Continued Clinical Symptoms:  Alcohol Use Disorder Identification Test Final Score (AUDIT): 0 The "Alcohol Use Disorders Identification Test", Guidelines for Use in Primary Care, Second Edition.  World Science writerHealth Organization [facility](WHO). Score between 0-7:  no or low risk or alcohol related problems. Score between 8-15:  moderate risk of alcohol related problems. Score between 16-19:  high risk of alcohol related problems. Score 20 or above:  warrants further diagnostic evaluation for alcohol dependence and treatment.   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 Presented to ED voluntarily.     Psychiatric Specialty Exam: Physical Exam  ROS  Blood pressure 109/68, pulse 64, temperature 97.7 F (36.5 C), temperature source Oral, resp. rate 18, height 5\' 7"  (1.702 m), weight 86.2 kg (190 lb), SpO2 100 %.Body mass index is 29.76 kg/m.  See admit note MSE    COGNITIVE FEATURES THAT CONTRIBUTE TO RISK:  Closed-mindedness and Loss of executive function    SUICIDE RISK:   Moderate:  Frequent suicidal ideation with limited intensity, and duration, some specificity in terms of plans,  no associated intent, good self-control, limited dysphoria/symptomatology, some risk factors present, and identifiable protective factors, including available and accessible social support.  PLAN OF CARE: Patient will be admitted to inpatient psychiatric unit for stabilization and safety. Will provide and encourage milieu participation. Provide medication management and maked adjustments as needed.  Will follow daily.    I certify that inpatient services furnished can reasonably be expected to improve the patient's condition.   Craige CottaFernando A Cobos, MD 04/29/2016, 11:43 AM

## 2016-04-29 NOTE — Progress Notes (Signed)
Nurse took medication estrogen out of her locker 4 and put in med drawer on 400 hall.  Patient stated she needs to take this med daily.  NP informed.

## 2016-04-29 NOTE — Progress Notes (Signed)
Adult Psychoeducational Group Note  Date:  04/29/2016 Time:  11:58 AM  Group Topic/Focus:  Goals Group:   The focus of this group is to help patients establish daily goals to achieve during treatment and discuss how the patient can incorporate goal setting into their daily lives to aide in recovery.  Participation Level:  Active  Participation Quality:  Appropriate  Affect:  Appropriate  Cognitive:  Alert  Insight: Appropriate  Engagement in Group:  Engaged  Modes of Intervention:  Activity  Additional Comments:  Pt spoke on her goal for today and participated in group activity.  Dellia NimsJaquesha M Wylder Macomber 04/29/2016, 11:58 AM

## 2016-04-29 NOTE — BHH Group Notes (Signed)
BHH LCSW Group Therapy  04/29/2016 1:15pm  Type of Therapy:  Group Therapy vercoming Obstacles  Participation Level:  Active  Participation Quality:  Appropriate   Affect:  Appropriate  Cognitive:  Appropriate and Oriented  Insight:  Developing/Improving and Improving  Engagement in Therapy:  Improving  Modes of Intervention:  Discussion, Exploration, Problem-solving and Support  Description of Group:   In this group patients will be encouraged to explore what they see as obstacles to their own wellness and recovery. They will be guided to discuss their thoughts, feelings, and behaviors related to these obstacles. The group will process together ways to cope with barriers, with attention given to specific choices patients can make. Each patient will be challenged to identify changes they are motivated to make in order to overcome their obstacles. This group will be process-oriented, with patients participating in exploration of their own experiences as well as giving and receiving support and challenge from other group members.  Summary of Patient Progress: Pt identified difficulty with decision-making and managing transitions are two primary obstacles that she is facing. Pt reports feeling overwhelmed by these circumstances and is hopeful to regain some control in her decision-making and healthy coping strategies.    Therapeutic Modalities:   Cognitive Behavioral Therapy Solution Focused Therapy Motivational Interviewing Relapse Prevention Therapy   Vernie ShanksLauren Kira Hartl, LCSW 04/29/2016 3:34 PM

## 2016-04-29 NOTE — Progress Notes (Signed)
D: Pt was in her room with visitors upon initial approach.  Pt presents with depressed affect and mood.  Her goal was to "get through the day and I did."  She reports she had a "good" visit with her "mom, dad, and sister."  She reports she had SI earlier today "but not right now."  Regarding suicidal ideations, pt states "from yesterday, it's a lot better."  Pt denies HI, denies hallucinations, denies pain.  Pt has been visible in milieu interacting with peers and staff appropriately.  Pt attended evening group.    A: Introduced self to pt.  Actively listened to pt and offered support and encouragement.  PRN medication administered for sleep.  Q15 minute safety checks maintained.  R: Pt is safe on the unit.  Pt is compliant with medications.  Pt verbally contracts for safety.  Will continue to monitor and assess.

## 2016-04-30 LAB — HEMOGLOBIN A1C
HEMOGLOBIN A1C: 5.4 % (ref 4.8–5.6)
MEAN PLASMA GLUCOSE: 108 mg/dL

## 2016-04-30 NOTE — Progress Notes (Signed)
D: Pt was in the dayroom upon initial approach.  Pt presents with anxious, depressed affect and mood.  She reports her day was "all right."  Pt reports she feels better today than yesterday.  She states "I'm a little bit anxious today."  She reports she is hoping to discharge Thursday.  Pt reports having a good visit with her mother and father.  Pt denies SI/HI, denies hallucinations, reports right arm pain of 4/10.  Pt has been visible in milieu interacting with peers and staff appropriately.  Pt attended evening group.    A: Actively listened to pt and offered support and encouragement.  PRN medication administered for anxiety, sleep, and pain.  Q15 minute safety checks maintained.  R: Pt is safe on the unit.  Pt is compliant with medications.  Pt verbally contracts for safety.  Will continue to monitor and assess.

## 2016-04-30 NOTE — Progress Notes (Signed)
[facility] MD Progress Note  04/30/2016 1:06 PM Wendy Maynard  MRN:  [id number] Subjective:  Patient continues to feel depressed, but states today has been feeling " a little better". She states she realizes she has been feeling very angry about her job related circumstances, and identifies this as major stressor. She states she has an unsupportive boss/supervisor, who " apparently tells people I am incompetent so they do not hire me, but does not fire me either". States " my job responsibilities are two pages long, and I get almost no support to be able to do my job ". Today is more future oriented, and spoke about potential options, such as resigning , going to live with her parents for a period of time, and taking some extra college courses, or going to her HR department. Objective : I have discussed case with treatment team and have met with patient. Remains depressed, sad, but affect has improved partially since admission, and today smiles at times appropriately and as above, is also more future oriented , talking about possible options she might choose to follow. Does remain ruminative, and identifies job related stress as major issue . Behavior on unit in good control. Denies medication side effects .  Principal Problem: MDD (major depressive disorder), recurrent severe, without psychosis ([place]) Diagnosis:   Patient Active Problem List   Diagnosis Date Noted  . MDD (major depressive disorder), recurrent severe, without psychosis ([place]) [F33.2] 04/28/2016  . Chronic calculous cholecystitis [K80.10] 03/23/2013  . Cholecystitis [K81.9] 02/26/2013   Total Time spent with patient: 20 minutes   Past Medical History:  Past Medical History:  Diagnosis Date  . Abdominal pain in female patient 11/2012  . Anxiety   . Depression   . Diabetes mellitus without complication ([place])   . Migraines   . Polycystic ovarian disease     Past Surgical History:  Procedure Laterality Date  . CHOLECYSTECTOMY N/A  02/26/2013   Procedure: LAPAROSCOPIC CHOLECYSTECTOMY WITH INTRAOPERATIVE CHOLANGIOGRAM;  Surgeon: Odis Hollingshead, MD;  Location: [facility];  Service: General;  Laterality: N/A;  . WISDOM TOOTH EXTRACTION     Family History: History reviewed. No pertinent family history. Social History:  History  Alcohol Use No    Comment: Described as episodic     History  Drug Use No    Comment: Last use 04/27/16    Social History   Social History  . Marital status: Single    Spouse name: N/A  . Number of children: N/A  . Years of education: N/A   Social History Main Topics  . Smoking status: Current Every Day Smoker    Packs/day: 0.50    Years: 8.00    Types: Cigarettes  . Smokeless tobacco: Never Used     Comment: refused  . Alcohol use No     Comment: Described as episodic  . Drug use: No     Comment: Last use 04/27/16  . Sexual activity: Not Currently    Birth control/ protection: Condom   Other Topics Concern  . None   Social History Narrative  . None   Additional Social History:    Pain Medications: n/a Prescriptions: See PTA Over the Counter: See PTA History of alcohol / drug use?: No history of alcohol / drug abuse Name of Substance 1: Marijuana 1 - Amount (size/oz): Varied 1 - Frequency: Daily 1 - Duration: Ongoing 1 - Last Use / Amount: 04/27/16  Sleep: Good  Appetite:  Good  Current Medications: Current Facility-Administered Medications  Medication Dose Route Frequency Provider Last Rate Last Dose  . acetaminophen (TYLENOL) tablet 650 mg  650 mg Oral Q6H PRN Ethelene Hal, NP      . alum & mag hydroxide-simeth (MAALOX/MYLANTA) 200-200-20 MG/5ML suspension 30 mL  30 mL Oral Q4H PRN Ethelene Hal, NP      . ARIPiprazole (ABILIFY) tablet 2 mg  2 mg Oral Daily Jenne Campus, MD   2 mg at 04/30/16 0758  . estradiol (ESTRACE) tablet 1 mg  1 mg Oral Daily Encarnacion Slates, NP   1 mg at 04/30/16 0802  . hydrOXYzine (ATARAX/VISTARIL) tablet 25 mg  25 mg  Oral Q6H PRN Ethelene Hal, NP   25 mg at 04/29/16 1705  . magnesium hydroxide (MILK OF MAGNESIA) suspension 30 mL  30 mL Oral Daily PRN Ethelene Hal, NP      . sertraline (ZOLOFT) tablet 100 mg  100 mg Oral Daily Jenne Campus, MD   100 mg at 04/30/16 0758  . traZODone (DESYREL) tablet 50 mg  50 mg Oral QHS PRN Ethelene Hal, NP   50 mg at 04/29/16 2106    Lab Results:  Results for orders placed or performed during the hospital encounter of 04/28/16 (from the past 48 hour(s))  CBC     Status: None   Collection Time: 04/28/16  6:36 PM  Result Value Ref Range   WBC 9.4 4.0 - 10.5 K/uL   RBC 4.61 3.87 - 5.11 MIL/uL   Hemoglobin 12.5 12.0 - 15.0 g/dL   HCT 37.7 36.0 - 46.0 %   MCV 81.8 78.0 - 100.0 fL   MCH 27.1 26.0 - 34.0 pg   MCHC 33.2 30.0 - 36.0 g/dL   RDW 13.8 11.5 - 15.5 %   Platelets 272 150 - 400 K/uL    Comment: Performed at [facility], [place] [address], [place], [place] [address]  Comprehensive metabolic panel     Status: Abnormal   Collection Time: 04/28/16  6:36 PM  Result Value Ref Range   Sodium 141 135 - 145 mmol/L   Potassium 3.7 3.5 - 5.1 mmol/L   Chloride 107 101 - 111 mmol/L   CO2 25 22 - 32 mmol/L   Glucose, Bld 90 65 - 99 mg/dL   BUN 14 6 - 20 mg/dL   Creatinine, Ser 0.68 0.44 - 1.00 mg/dL   Calcium 9.5 8.9 - 10.3 mg/dL   Total Protein 8.3 (H) 6.5 - 8.1 g/dL   Albumin 4.6 3.5 - 5.0 g/dL   AST 21 15 - 41 U/L   ALT 18 14 - 54 U/L   Alkaline Phosphatase 67 38 - 126 U/L   Total Bilirubin 0.5 0.3 - 1.2 mg/dL   GFR calc non Af Amer >60 >60 mL/min   GFR calc Af Amer >60 >60 mL/min    Comment: (NOTE) The eGFR has been calculated using the CKD EPI equation. This calculation has not been validated in all clinical situations. eGFR's persistently <60 mL/min signify possible Chronic Kidney Disease.    Anion gap 9 5 - 15    Comment: Performed at [facility], [place] [address] [place], [place] [address]   Hemoglobin A1c     Status: None   Collection Time: 04/28/16  6:36 PM  Result Value Ref Range   Hgb A1c MFr Bld 5.4 4.8 - 5.6 %    Comment: (NOTE)         Pre-diabetes: 5.7 -  6.4         Diabetes: >6.4         Glycemic control for adults with diabetes: <7.0    Mean Plasma Glucose 108 mg/dL    Comment: (NOTE) Performed At: [facility] [place], [place] [id number] Lindon Romp MD TI:[id number] Performed at [facility], [place] [address], [place], [place] [address]   TSH     Status: None   Collection Time: 04/28/16  6:36 PM  Result Value Ref Range   TSH 2.520 0.350 - 4.500 uIU/mL    Comment: Performed by a 3rd Generation assay with a functional sensitivity of <=0.01 uIU/mL. Performed at [facility], [place] [address], [place], [place] [address]   Urinalysis, Routine w reflex microscopic     Status: Abnormal   Collection Time: 04/29/16  6:21 AM  Result Value Ref Range   Color, Urine YELLOW YELLOW   APPearance CLEAR CLEAR   Specific Gravity, Urine 1.017 1.005 - 1.030   pH 5.0 5.0 - 8.0   Glucose, UA NEGATIVE NEGATIVE mg/dL   Hgb urine dipstick NEGATIVE NEGATIVE   Bilirubin Urine NEGATIVE NEGATIVE   Ketones, ur NEGATIVE NEGATIVE mg/dL   Protein, ur NEGATIVE NEGATIVE mg/dL   Nitrite NEGATIVE NEGATIVE   Leukocytes, UA TRACE (A) NEGATIVE   RBC / HPF 0-5 0 - 5 RBC/hpf   WBC, UA 0-5 0 - 5 WBC/hpf   Bacteria, UA NONE SEEN NONE SEEN   Squamous Epithelial / LPF 0-5 (A) NONE SEEN   Mucous PRESENT     Comment: Performed at [facility], [place] [address], [place], [place] [address]    Blood Alcohol level:  No results found for: [facility]  Metabolic Disorder Labs: Lab Results  Component Value Date   HGBA1C 5.4 04/28/2016   MPG 108 04/28/2016   No results found for: PROLACTIN No results found for: CHOL, TRIG, HDL, CHOLHDL, VLDL, LDLCALC  Physical Findings: AIMS: Facial and Oral Movements Muscles of  Facial Expression: None, normal Lips and Perioral Area: None, normal Jaw: None, normal Tongue: None, normal,Extremity Movements Upper (arms, wrists, hands, fingers): None, normal Lower (legs, knees, ankles, toes): None, normal, Trunk Movements Neck, shoulders, hips: None, normal, Overall Severity Severity of abnormal movements (highest score from questions above): None, normal Incapacitation due to abnormal movements: None, normal Patient's awareness of abnormal movements (rate only patient's report): No Awareness, Dental Status Current problems with teeth and/or dentures?: No Does patient usually wear dentures?: No  CIWA:  CIWA-Ar Total: 1 COWS:  COWS Total Score: 1  Musculoskeletal: Strength & Muscle Tone: within normal limits Gait & Station: normal Patient leans: N/A  Psychiatric Specialty Exam: Physical Exam  ROS no chest pain, no shortness of breath, no vomiting   Blood pressure 111/77, pulse 71, temperature 98.4 F (36.9 C), temperature source Oral, resp. rate 16, height _0  (1.702 m), weight 86.2 kg (190 lb), SpO2 100 %.Body mass index is 29.76 kg/m.  General Appearance: Fairly Groomed  Eye Contact:  Good  Speech:  Normal Rate  Volume:  Normal  Mood:  remains depressed, some improvement compared to admission  Affect:  constricted, but smiles at times appropriately   Thought Process:  Goal Directed and Linear  Orientation:  Full (Time, Place, and Person)  Thought Content:  ruminative about job related stressors, no hallucinations, no delusions   Suicidal Thoughts:  No today denies any suicidal or self injurious ideations, also denies any homicidal or violent ideations  towards anyone   Homicidal Thoughts:  No  Memory:  recent and remote grossly intact   Judgement:  Other:  improving   Insight:  improving   Psychomotor Activity:  Normal  Concentration:  Concentration: Good and Attention Span: Good  Recall:  Good  Fund of Knowledge:  Good  Language:  Good  Akathisia:   Negative  Handed:  Right  AIMS (if indicated):     Assets:  Communication Skills Desire for Improvement Resilience  ADL's:  Intact  Cognition:  WNL  Sleep:  Number of Hours: 6.5   Assessment - patient remains sad, depressed, ruminative, mainly about job related stressors, but today reporting some improvement compared to how she felt at admission, and presenting with a partially improved range of affect and more future oriented. No active SI at this time.  Denies medication side effects at present.  Treatment Plan Summary: Daily contact with patient to assess and evaluate symptoms and progress in treatment, Medication management, Plan inpatient admission  and medications as below Encourage ongoing group and milieu participation to work on coping skills and symptom reduction Continue Zoloft 100 mgrs QDAY for depression and anxiety Continue Abilify 2 mgrs QDAY as antidepressant augmentation strategy Continue Trazodone 50 mgrs QHS PRN for insomnia Continue Vistaril 25 mgrs Q 6 hours PRN for anxiety Treatment team working on disposition planning options Jenne Campus, MD 04/30/2016, 1:06 PM

## 2016-04-30 NOTE — BHH Group Notes (Signed)
[facility] 04/30/2016 1:15pm  Type of Therapy: Mental Health Association Presentation  Participation Level: Active  Participation Quality: Attentive  Affect: Appropriate  Cognitive: Oriented  Insight: Developing/Improving  Engagement in Therapy: Engaged  Modes of Intervention: Discussion, Education and Socialization  Summary of Progress/Problems: Mental Health Association (MHA) Speaker came to talk about his personal journey with substance abuse and addiction. The pt processed ways by which to relate to the speaker. MHA speaker provided handouts and educational information pertaining to groups and services offered by the [facility]. Pt was engaged in speaker's presentation and was receptive to resources provided.    Wendy BackersLynn B. Beverely Maynard, MSW, LCSWA 04/30/2016 2:23 PM

## 2016-04-30 NOTE — Progress Notes (Signed)
Adult Psychoeducational Group Note  Date:  04/30/2016 Time:  9:25 PM  Group Topic/Focus:  Wrap-Up Group:   The focus of this group is to help patients review their daily goal of treatment and discuss progress on daily workbooks.  Participation Level:  Active  Participation Quality:  Appropriate  Affect:  Appropriate  Cognitive:  Alert  Insight: Appropriate  Engagement in Group:  Engaged  Modes of Intervention:  Discussion  Additional Comments:  Patient rated her day a 7. Patient's goal for today was to work on her discharge and after care plan.   Wendy Maynard [name] 04/30/2016, 9:25 PM

## 2016-04-30 NOTE — BHH Group Notes (Signed)
BHH LCSW Group Therapy 04/30/2016 1:15 PM  Type of Therapy: Group Therapy- Feelings about Diagnosis  Participation Level: Reserved  Participation Quality:  Appropriate  Affect:  Appropriate  Cognitive: Alert and Oriented   Insight:  Developing   Engagement in Therapy: Developing/Improving and Engaged   Modes of Intervention: Clarification, Confrontation, Discussion, Education, Exploration, Limit-setting, Orientation, Problem-solving, Rapport Building, Dance movement psychotherapisteality Testing, Socialization and Support  Description of Group:   This group will allow patients to explore their thoughts and feelings about diagnoses they have received. Patients will be guided to explore their level of understanding and acceptance of these diagnoses. Facilitator will encourage patients to process their thoughts and feelings about the reactions of others to their diagnosis, and will guide patients in identifying ways to discuss their diagnosis with significant others in their lives. This group will be process-oriented, with patients participating in exploration of their own experiences as well as giving and receiving support and challenge from other group members.  Summary of Progress/Problems:  Pt reports that she feels that her family does not have a good understanding of mental illness and they suggest that she should just "be over it" by now.  Therapeutic Modalities:   Cognitive Behavioral Therapy Solution Focused Therapy Motivational Interviewing Relapse Prevention Therapy  Vernie ShanksLauren Rhena Glace, LCSW 04/30/2016 3:45 PM

## 2016-04-30 NOTE — Plan of Care (Signed)
Problem: Safety: Goal: Periods of time without injury will increase Outcome: Progressing Pt has not harmed self or others tonight.  She denies SI/HI and verbally contracts for safety.    

## 2016-04-30 NOTE — Progress Notes (Signed)
NSG shift assessment. 7a-7p.   D: Affect blunted, mood depressed, behavior appropriate. Attends groups and participates. Cooperative with staff and is getting along well with peers.   A: Observed pt interacting in group and in the milieu: Support and encouragement offered. Safety maintained with observations every 15 minutes.   R:  Pt completed self inventory. She reported sleeping god, depression rated 7/10 and hopelessness rated 6/10. Anxiety continues to be an identified problem of 8/10. No complaints of pain voiced. Pt's goal is to "Tame my anxiety throughout the day so that I can think." she plans to ask for medications if needed and keep her back to the wall if stressed, and deep breathe.

## 2016-04-30 NOTE — BHH Group Notes (Signed)
B[facility] Group Notes:  (Nursing/MHT/Case Management/Adjunct)  Date:  04/30/2016  Time:  10:13 AM  Type of Therapy:  Nurse Education  Participation Level:  Minimal  Participation Quality:  Appropriate and Attentive  Affect:  Anxious  Cognitive:  Alert  Insight:  Appropriate  Engagement in Group:  Improving  Modes of Intervention:  Clarification, Discussion and Education  Summary of Progress/Problems: Pt is working on Pharmacologistcoping skills for anxiety.   Florina OuBatchelor, Diane C 04/30/2016, 10:13 AM

## 2016-05-01 MED ORDER — NICOTINE POLACRILEX 2 MG MT GUM
2.0000 mg | CHEWING_GUM | OROMUCOSAL | Status: DC | PRN
  Administered 2016-05-01 – 2016-05-02 (×5): 2 mg via ORAL
  Filled 2016-05-01 (×3): qty 1

## 2016-05-01 NOTE — BHH Group Notes (Signed)
B[facility] LCSW Group Therapy 05/01/2016 1:15 PM  Type of Therapy: Group Therapy- Emotion Regulation  Participation Level: Active   Participation Quality:  Appropriate  Affect: Appropriate  Cognitive: Alert and Oriented   Insight:  Developing/Improving  Engagement in Therapy: Developing/Improving and Engaged   Modes of Intervention: Clarification, Confrontation, Discussion, Education, Exploration, Limit-setting, Orientation, Problem-solving, Rapport Building, Dance movement psychotherapisteality Testing, Socialization and Support  Summary of Progress/Problems: The topic for group today was emotional regulation. This group focused on both positive and negative emotion identification and allowed group members to process ways to identify feelings, regulate negative emotions, and find healthy ways to manage internal/external emotions. Group members were asked to reflect on a time when their reaction to an emotion led to a negative outcome and explored how alternative responses using emotion regulation would have benefited them. Group members were also asked to discuss a time when emotion regulation was utilized when a negative emotion was experienced. Pt identified sadness as an emotion that she has a difficult time regulating. Pt stated that reading helps her when she is feeling down. Pt was engaged for the duration of the group and offered meaningful feedback to peers.   Jonathon JordanLynn B Maryuri Warnke, MSW, Theresia MajorsLCSWA [phone] 05/01/2016 4:17 PM

## 2016-05-01 NOTE — [facility] Group Notes (Signed)
Pt attended spiritual care group on grief and loss facilitated by chaplain Wendy Maynard   Group opened with brief discussion and psycho-social ed around grief and loss in relationships and in relation to self - identifying life patterns, circumstances, changes that cause losses. Established group norm of speaking from own life experience. Group goal of establishing open and affirming space for members to share loss and experience with grief, normalize grief experience and provide psycho social education and grief support.   Wendy Maynard was present through group with exception of being pulled to speak with care team.  She returned to group.  She expressed that she is embarking on changing her life significantly, as she no longer feels she can work within [employer].  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  Spoke about the difficulty of offering herself care as well as her experience of others belittling her experiences by saying "you should just deal with it."  Stated she hopes to take time to focus on herself and find ways to care for herself and reconnect with herself - a practice that she reports she knows how to do cognitively, but does not know what this would feel like.  She is hopeful that her next step in her career will come from a place of being in touch with how she wants to live.     Wendy Maynard, Wendy Maynard MDiv

## 2016-05-01 NOTE — Progress Notes (Signed)
Nursing Note 05/01/2016 [id number]  Data Reports sleeping good with PRN sleep med.  Rates depression 4/10, hopelessness 3/10, and anxiety 5/10. Affect anxious but appropriate.  Denies HI, SI, AVH.  Attending groups.  Spends free time mostly in room, but seen in dayroom interacting with a select few peers intermittently.  C/O anxiety and nicotine cravings "I don't want the patch because it's been a few days, I might as well see if I can quit when I leave."  Reports irritability this afternoon.   Action Spoke with patient 1:1, nurse offered support to patient throughout shift.  PRN given for irritability/anxiety.  Continues to be monitored on 15 minute checks for safety.  Response PRN effective evidenced by reduction in anxiety/agitation.  Remains safe on unit.

## 2016-05-01 NOTE — Progress Notes (Signed)
[facility] MD Progress Note  05/01/2016 4:59 PM Aerin Caroll  MRN:  [id number] Subjective:  Reports she is feeling better - states " I am feeling more assertive", and states she feels she is gaining a sense of clarity as to how to proceed regarding her stressors. 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 She states her parents are supportive. Denies any suicidal ideations. Denies medication side effects.  Objective : I have discussed case with treatment team and have met with patient. Patient presents with improving mood and improving range of affect compared to her admission presentation - as above, is future oriented, and able to discuss tangible approaches to mitigate /address her stressors, which she has described as mostly job related . Reports a subjective sense of feeling more self assured, less anhedonic. Denies medication side effects. As she improves she is focusing on discharging soon.  No disruptive or agitated behaviors on unit , going to groups .   Principal Problem: MDD (major depressive disorder), recurrent severe, without psychosis ([place]) Diagnosis:   Patient Active Problem List   Diagnosis Date Noted  . MDD (major depressive disorder), recurrent severe, without psychosis ([place]) [F33.2] 04/28/2016  . Chronic calculous cholecystitis [K80.10] 03/23/2013  . Cholecystitis [K81.9] 02/26/2013   Total Time spent with patient: 20 minutes   Past Medical History:  Past Medical History:  Diagnosis Date  . Abdominal pain in female patient 11/2012  . Anxiety   . Depression   . Diabetes mellitus without complication ([place])   . Migraines   . Polycystic ovarian disease     Past Surgical History:  Procedure Laterality Date  . CHOLECYSTECTOMY N/A 02/26/2013   Procedure: LAPAROSCOPIC CHOLECYSTECTOMY WITH INTRAOPERATIVE CHOLANGIOGRAM;  Surgeon: Odis Hollingshead, MD;  Location: [facility] ORS;  Service: General;  Laterality: N/A;  . WISDOM TOOTH EXTRACTION     Family History: History reviewed. No pertinent family history. Social History:  History  Alcohol Use No    Comment: Described as episodic     History  Drug Use No    Comment: Last use 04/27/16    Social History   Social History  . Marital status: Single    Spouse name: N/A  . Number of children: N/A  . Years of education: N/A   Social History Main Topics  . Smoking status: Current Every Day Smoker    Packs/day: 0.50    Years: 8.00    Types: Cigarettes  . Smokeless tobacco: Never Used     Comment: refused  . Alcohol use No     Comment: Described as episodic  . Drug use: No     Comment: Last use 04/27/16  . Sexual activity: Not Currently    Birth control/ protection: Condom   Other Topics Concern  . None   Social History Narrative  . None   Additional Social History:    Pain Medications: n/a Prescriptions: See PTA Over the Counter: See PTA History of alcohol / drug use?: No history of alcohol / drug abuse Name of Substance 1: Marijuana 1 - Amount (size/oz): Varied 1 - Frequency: Daily 1 - Duration: Ongoing 1 - Last Use / Amount: 04/27/16  Sleep: Good  Appetite:  Good  Current Medications: Current Facility-Administered Medications  Medication Dose Route Frequency Provider Last Rate Last Dose  . acetaminophen (TYLENOL) tablet 650 mg  650 mg Oral  Q6H PRN Ethelene Hal, NP   650 mg at 05/01/16 1513  . alum & mag hydroxide-simeth (MAALOX/MYLANTA) 200-200-20 MG/5ML suspension 30 mL  30 mL Oral Q4H PRN Ethelene Hal, NP      . ARIPiprazole (ABILIFY) tablet 2 mg  2 mg Oral Daily Jenne Campus, MD   2 mg at 05/01/16 1610  . estradiol (ESTRACE) tablet 1 mg  1 mg Oral Daily Encarnacion Slates, NP   1 mg at 05/01/16 9604  . hydrOXYzine (ATARAX/VISTARIL) tablet 25 mg  25 mg Oral Q6H PRN Ethelene Hal, NP   25 mg at 05/01/16 1513  . magnesium  hydroxide (MILK OF MAGNESIA) suspension 30 mL  30 mL Oral Daily PRN Ethelene Hal, NP      . nicotine polacrilex (NICORETTE) gum 2 mg  2 mg Oral PRN Jenne Campus, MD      . sertraline (ZOLOFT) tablet 100 mg  100 mg Oral Daily Jenne Campus, MD   100 mg at 05/01/16 5409  . traZODone (DESYREL) tablet 50 mg  50 mg Oral QHS PRN Ethelene Hal, NP   50 mg at 04/30/16 2105    Lab Results:  No results found for this or any previous visit (from the past 48 hour(s)).  Blood Alcohol level:  No results found for: [facility]  Metabolic Disorder Labs: Lab Results  Component Value Date   HGBA1C 5.4 04/28/2016   MPG 108 04/28/2016   No results found for: PROLACTIN No results found for: CHOL, TRIG, HDL, CHOLHDL, VLDL, LDLCALC  Physical Findings: AIMS: Facial and Oral Movements Muscles of Facial Expression: None, normal Lips and Perioral Area: None, normal Jaw: None, normal Tongue: None, normal,Extremity Movements Upper (arms, wrists, hands, fingers): None, normal Lower (legs, knees, ankles, toes): None, normal, Trunk Movements Neck, shoulders, hips: None, normal, Overall Severity Severity of abnormal movements (highest score from questions above): None, normal Incapacitation due to abnormal movements: None, normal Patient's awareness of abnormal movements (rate only patient's report): No Awareness, Dental Status Current problems with teeth and/or dentures?: No Does patient usually wear dentures?: No  CIWA:  CIWA-Ar Total: 1 COWS:  COWS Total Score: 1  Musculoskeletal: Strength & Muscle Tone: within normal limits Gait & Station: normal Patient leans: N/A  Psychiatric Specialty Exam: Physical Exam  ROS denies nausea or vomiting    Blood pressure 114/72, pulse 69, temperature 98 F (36.7 C), temperature source Oral, resp. rate 16, height _0  (1.702 m), weight 86.2 kg (190 lb), SpO2 100 %.Body mass index is 29.76 kg/m.  General Appearance: improved grooming   Eye Contact:   Good  Speech:  Normal Rate  Volume:  Normal  Mood:  improving mood, less depressed  Affect:  fuller range of affect   Thought Process:  Linear and Descriptions of Associations: Intact  Orientation:  Other:  fully alert and attentive  Thought Content:  less ruminative about stressors, no hallucinations, no delusions   Suicidal Thoughts:  No no suicidal or self injurious ideations, no homicidal or violent ideations  Homicidal Thoughts:  No  Memory:  recent and remote grossly intact   Judgement:  Other:  improving  Insight:  improving   Psychomotor Activity:  Normal  Concentration:  Concentration: Good and Attention Span: Good  Recall:  Good  Fund of Knowledge:  Good  Language:  Good  Akathisia:  No  Handed:  Right  AIMS (if indicated):     Assets:  Communication Skills Physical Health Resilience  ADL's:  Intact  Cognition:  WNL  Sleep:  Number of Hours: 6.5   Assessment - patient presents with improvement- less depressed, less constricted affect, more future oriented, and more optimistic that she will be able to address her stressors, at this time reporting she plans to consider resigning and getting some further education in order to apply for a better job. Denies medication side effects tolerating Zoloft/Abilify well thus far.  Treatment Plan Summary: Treatment plan reviewed today 3/21 as below Daily contact with patient to assess and evaluate symptoms and progress in treatment, Medication management, Plan inpatient admission  and medications as below Encourage ongoing group and milieu participation to work on coping skills and symptom reduction Continue Zoloft 100 mgrs QDAY for depression and anxiety Continue Abilify 2 mgrs QDAY as antidepressant augmentation strategy Continue Trazodone 50 mgrs QHS PRN for insomnia Continue Vistaril 25 mgrs Q 6 hours PRN for anxiety Treatment team working on disposition planning options Jenne Campus, MD 05/01/2016, 4:59 PM   Patient ID:  Sherril Cong, female   DOB: 08-08-1975, 41 y.o.   MRN: [id number]

## 2016-05-01 NOTE — Progress Notes (Signed)
Pt in room playing cards with mom and sister at visiting time.  Mom sts she is a psych NP and Pt's sister sts she is a cardiac OR nurse.  Pt is friendly and cooperative.  Pt denies SI HI and AVH.  Pt contracts for safety.  Pt sts she is a bit anxious about d/c possibly in am. Pt denies pain or discomfort. Pt given prn meds.  Pt offered support and encouragement. Pt remains safe on unit.

## 2016-05-01 NOTE — Progress Notes (Signed)
Adult Psychoeducational Group Note  Date:  05/01/2016 Time:  10:54 PM  Group Topic/Focus:  Wrap-Up Group:   The focus of this group is to help patients review their daily goal of treatment and discuss progress on daily workbooks.  Participation Level:  Active  Participation Quality:  Appropriate  Affect:  Appropriate  Cognitive:  Alert  Insight: Appropriate  Engagement in Group:  Engaged  Modes of Intervention:  Discussion  Additional Comments:  Pt stated that today was good. Respect to her " having respect for others and standing up for yourself".   Kaleen OdeaCOOKE, Arnoldo Hildreth R 05/01/2016, 10:54 PM

## 2016-05-01 NOTE — Progress Notes (Signed)
Recreation Therapy Notes  Date: 05/01/16 Time: 0930 Location: 300 Hall Dayroom  Group Topic: Stress Management  Goal Area(s) Addresses:  Patient will verbalize importance of using healthy stress management.  Patient will identify positive emotions associated with healthy stress management.   Intervention: Stress Management  Activity :  Peaceful Place.  LRT introduced the stress management technique of guided imagery.  LRT read a script to guide patients through the technique.  Patients were to follow along as LRT read script to participate in the technique.  Education:  Stress Management, Discharge Planning.   Education Outcome: Acknowledges edcuation/In group clarification offered/Needs additional education  Clinical Observations/Feedback: Pt did not attend group.   Caroll RancherMarjette Halana Deisher, LRT/CTRS         Caroll RancherLindsay, Devin Foskey A 05/01/2016 12:38 PM

## 2016-05-02 MED ORDER — HYDROXYZINE HCL 25 MG PO TABS: 25.0000 mg | ORAL_TABLET | Freq: Four times a day (QID) | ORAL | 0 refills | Status: DC | PRN

## 2016-05-02 MED ORDER — ARIPIPRAZOLE 2 MG PO TABS: 2.0000 mg | ORAL_TABLET | Freq: Every day | ORAL | 0 refills | Status: DC

## 2016-05-02 MED ORDER — TRAZODONE HCL 50 MG PO TABS: 50.0000 mg | ORAL_TABLET | Freq: Every evening | ORAL | 0 refills | Status: DC | PRN

## 2016-05-02 MED ORDER — ESTRADIOL 1 MG PO TABS: 1.0000 mg | ORAL_TABLET | Freq: Every day | ORAL | 0 refills | Status: DC

## 2016-05-02 MED ORDER — NICOTINE POLACRILEX 2 MG MT GUM: 2.0000 mg | CHEWING_GUM | OROMUCOSAL | 0 refills | Status: DC | PRN

## 2016-05-02 MED ORDER — SERTRALINE HCL 100 MG PO TABS: 100.0000 mg | ORAL_TABLET | Freq: Every day | ORAL | 0 refills | Status: DC

## 2016-05-02 NOTE — [facility] Group Notes (Signed)

## 2016-05-02 NOTE — [facility] Suicide Risk Assessment (Signed)
[facility]   Principal Problem: MDD (major depressive disorder), recurrent severe, without psychosis ([facility]) Discharge Diagnoses:  Patient Active Problem List   Diagnosis Date Noted  . MDD (major depressive disorder), recurrent severe, without psychosis ([facility]) [F33.2] 04/28/2016  . Chronic calculous cholecystitis [K80.10] 03/23/2013  . Cholecystitis [K81.9] 02/26/2013    Total Time spent with patient: 30 minutes  Musculoskeletal: Strength & Muscle Tone: within normal limits Gait & Station: normal Patient leans: N/A  Psychiatric Specialty Exam: ROS denies nausea, no vomiting, no fever, no chills, no rash   Blood pressure 111/68, pulse 77, temperature 98.2 F (36.8 C), temperature source Oral, resp. rate 16, height 5\' 7"  (1.702 m), weight 86.2 kg (190 lb), SpO2 100 %.Body mass index is 29.76 kg/m.  General Appearance: Well Groomed  Eye Contact::  Good  Speech:  Normal Rate409  Volume:  Normal  Mood:  reports feeling much better than on admission, describes mood as 7/10  Affect:  Appropriate and Full Range  Thought Process:  Linear and Descriptions of Associations: Intact  Orientation:  Full (Time, Place, and Person)  Thought Content:  denies hallucinations, no delusions   Suicidal Thoughts:  No denies any suicidal or self injurious ideations, denies any homicidal or violent ideations   Homicidal Thoughts:  No  Memory:  recent and remote grossly intact   Judgement:  Other:  improved   Insight:  improved   Psychomotor Activity:  Normal  Concentration:  Good  Recall:  Good  Fund of Knowledge:Good  Language: Good  Akathisia:  Negative  Handed:  Right  AIMS (if indicated):     Assets:  Communication Skills Desire for Improvement Resilience Social Support  Sleep:  Number of Hours: 5.5  Cognition: WNL  ADL's:  Intact   Mental Status Per Nursing Assessment::   On Admission:  Suicidal ideation indicated by patient  Demographic Factors:  41 year  old single female, lives alone, employed as Comptrollerlibrarian   Loss Factors: Stressful job   Historical Factors: No prior psychiatric admissions, has never attempted suicide   Risk Reduction Factors:   Sense of responsibility to family, Employed, Positive social support and Positive coping skills or problem solving skills  Continued Clinical Symptoms:  Patient presents significantly improved compared to admission. At this time presents alert, attentive , well related, with much improved mood and fuller range of affect. Denies any suicidal or any violent or homicidal ideations, no hallucinations, no delusions, not internally preoccupied. She is less ruminative about stressors, and states she is thinking of resigning in order to apply for a [employer] position once she completes some required classes for it. Denies medication side effects. On unit presents calm, pleasant.   Cognitive Features That Contribute To Risk:  No gross cognitive deficits noted upon discharge. Is alert , attentive, and oriented x 3   Suicide Risk:  Mild:  Suicidal ideation of limited frequency, intensity, duration, and specificity.  There are no identifiable plans, no associated intent, mild dysphoria and related symptoms, good self-control (both objective and subjective assessment), few other risk factors, and identifiable protective factors, including available and accessible social support.  Follow-up Information    [facility] Follow up.   Specialty:  Behavioral Health Why:  3/26 at 3:30pm for therapy with Colon Brancharson 4/2 at 2:30 for medication with Corie ChiquitoJessica Carter Contact information: [address] [place] [address] [phone]        [facility] Go on 05/07/2016.  Specialty:  Behavioral Health Why:  Appointment for IOP assessment at 8:45AM. Jeri Modena with IOP will be giving you call on Friday or Monday to give you details about the appointment and  answer any questions you may have. Thank you! Contact information: [address] [id number] [address] [address] [phone]          Plan Of Care/Follow-up recommendations:  Activity:  as tolerated Diet:  Regular Tests:  NA Other:  See below  Patient is leaving unit in good spirits. Plans to return home. Plans to follow up as above  Also has established PCP ( Dr. Wynelle Link) for medical follow ups as needed   Craige Cotta, MD 05/02/2016, 8:48 AM
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
## 2016-05-02 NOTE — Tx Team (Signed)
Interdisciplinary Treatment and Diagnostic Plan Update  05/02/2016 Time of Session: 9:30am Wendy Maynard MRN: [id number]  Principal Diagnosis: Major Depression, Recurrent, no Psychotic Features  Secondary Diagnoses: Principal Problem:   MDD (major depressive disorder), recurrent severe, without psychosis (HCC)   Current Medications:  Current Facility-Administered Medications  Medication Dose Route Frequency Provider Last Rate Last Dose  . acetaminophen (TYLENOL) tablet 650 mg  650 mg Oral Q6H PRN Laveda Abbe, NP   650 mg at 05/01/16 1513  . alum & mag hydroxide-simeth (MAALOX/MYLANTA) 200-200-20 MG/5ML suspension 30 mL  30 mL Oral Q4H PRN Laveda Abbe, NP      . ARIPiprazole (ABILIFY) tablet 2 mg  2 mg Oral Daily Craige Cotta, MD   2 mg at 05/02/16 0750  . estradiol (ESTRACE) tablet 1 mg  1 mg Oral Daily Sanjuana Kava, NP   1 mg at 05/02/16 0750  . hydrOXYzine (ATARAX/VISTARIL) tablet 25 mg  25 mg Oral Q6H PRN Laveda Abbe, NP   25 mg at 05/02/16 4098  . magnesium hydroxide (MILK OF MAGNESIA) suspension 30 mL  30 mL Oral Daily PRN Laveda Abbe, NP      . nicotine polacrilex (NICORETTE) gum 2 mg  2 mg Oral PRN Craige Cotta, MD   2 mg at 05/02/16 1191  . sertraline (ZOLOFT) tablet 100 mg  100 mg Oral Daily Craige Cotta, MD   100 mg at 05/02/16 0751  . traZODone (DESYREL) tablet 50 mg  50 mg Oral QHS PRN Laveda Abbe, NP   50 mg at 05/01/16 2130    PTA Medications: Prescriptions Prior to Admission  Medication Sig Dispense Refill Last Dose  . acetaminophen (TYLENOL) 500 MG tablet Take 500-1,000 mg by mouth every 6 (six) hours as needed (For headache or neck pain.).   Past Week  . ALPRAZolam (XANAX) 0.5 MG tablet Take 0.5 mg by mouth 3 (three) times daily as needed for anxiety.    2 weeks ago  . clotrimazole-betamethasone (LOTRISONE) cream Apply 1 application topically 3 (three) times daily as needed. For scar.  6 over a month ago  .  Cyanocobalamin (VITAMIN B12 PO) Take 1 tablet by mouth at bedtime.   04/26/2016  . diclofenac (VOLTAREN) 75 MG EC tablet Take 75 mg by mouth 2 (two) times daily as needed for mild pain.   0 Past Month  . estradiol (ESTRACE) 1 MG tablet Take 1 mg by mouth daily.  10 04/28/2016  . methocarbamol (ROBAXIN) 750 MG tablet Take 750 mg by mouth 3 (three) times daily as needed for muscle spasms.   0 Past Month  . Multiple Vitamin (MULTIVITAMIN WITH MINERALS) TABS tablet Take 1 tablet by mouth at bedtime.   04/26/2016  . sertraline (ZOLOFT) 50 MG tablet Take 100 mg by mouth at bedtime.  0 04/29/2016  . traZODone (DESYREL) 50 MG tablet Take 1 tablet (50 mg total) by mouth at bedtime. 15 tablet 0 2 weeks ago  . vitamin C (ASCORBIC ACID) 250 MG tablet Take 500 mg by mouth at bedtime.   04/26/2016  . VYVANSE 30 MG capsule Take 30 mg by mouth daily.  0 04/26/2016  . zolpidem (AMBIEN) 10 MG tablet Take 10 mg by mouth at bedtime.  0 04/27/2016    Treatment Modalities: Medication Management, Group therapy, Case management,  1 to 1 session with clinician, Psychoeducation, Recreational therapy.  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 Health problems Legal issue  Patient Strengths: Ability for insight  Active sense of humor Average or above average intelligence  Physician Treatment Plan for Primary Diagnosis: Major Depression, Recurrent, no Psychotic Features Long Term Goal(s): Improvement in symptoms so as ready for discharge  Short Term Goals: Ability to disclose and discuss suicidal ideas Ability to demonstrate self-control will improve Ability to identify and develop effective coping behaviors will improve Ability to maintain clinical measurements within normal limits will improve Ability to disclose and discuss suicidal ideas Ability to demonstrate self-control will improve Ability to identify and develop effective coping behaviors will improve Ability to maintain clinical  measurements within normal limits will improve  Medication Management: Evaluate patient's response, side effects, and tolerance of medication regimen.  Therapeutic Interventions: 1 to 1 sessions, Unit Group sessions and Medication administration.  Evaluation of Outcomes: Adequate for Discharge  Physician Treatment Plan for Secondary Diagnosis: Principal Problem:   MDD (major depressive disorder), recurrent severe, without psychosis (HCC)   Long Term Goal(s): Improvement in symptoms so as ready for discharge  Short Term Goals: Ability to disclose and discuss suicidal ideas Ability to demonstrate self-control will improve Ability to identify and develop effective coping behaviors will improve Ability to maintain clinical measurements within normal limits will improve Ability to disclose and discuss suicidal ideas Ability to demonstrate self-control will improve Ability to identify and develop effective coping behaviors will improve Ability to maintain clinical measurements within normal limits will improve  Medication Management: Evaluate patient's response, side effects, and tolerance of medication regimen.  Therapeutic Interventions: 1 to 1 sessions, Unit Group sessions and Medication administration.  Evaluation of Outcomes: Adequate for Discharge   RN Treatment Plan for Primary Diagnosis: Major Depression, Recurrent, no Psychotic Features Long Term Goal(s): Knowledge of disease and therapeutic regimen to maintain health will improve  Short Term Goals: Ability to verbalize feelings will improve, Ability to disclose and discuss suicidal ideas and Ability to identify and develop effective coping behaviors will improve  Medication Management: RN will administer medications as ordered by provider, will assess and evaluate patient's response and provide education to patient for prescribed medication. RN will report any adverse and/or side effects to prescribing provider.  Therapeutic  Interventions: 1 on 1 counseling sessions, Psychoeducation, Medication administration, Evaluate responses to treatment, Monitor vital signs and CBGs as ordered, Perform/monitor CIWA, COWS, AIMS and Fall Risk screenings as ordered, Perform wound care treatments as ordered.  Evaluation of Outcomes: Adequate for Discharge   LCSW Treatment Plan for Primary Diagnosis: Major Depression, Recurrent, no Psychotic Features Long Term Goal(s): Safe transition to appropriate next level of care at discharge, Engage patient in therapeutic group addressing interpersonal concerns.  Short Term Goals: Engage patient in aftercare planning with referrals and resources, Identify triggers associated with mental health/substance abuse issues and Increase skills for wellness and recovery  Therapeutic Interventions: Assess for all discharge needs, 1 to 1 time with Social worker, Explore available resources and support systems, Assess for adequacy in community support network, Educate family and significant other(s) on suicide prevention, Complete Psychosocial Assessment, Interpersonal group therapy.  Evaluation of Outcomes: Adequate for Discharge   Progress in Treatment: Attending groups: Yes Participating in groups: Yes Taking medication as prescribed: Yes, MD continues to assess for medication changes as needed Toleration medication: Yes, no side effects reported at this time Family/Significant other contact made: No, pt declined contact. Patient understands diagnosis: Yes, AEB pt's willingness to participate in treatment. Discussing patient identified problems/goals with staff: Yes Medical problems stabilized or resolved: Yes Denies suicidal/homicidal ideation: Yes Issues/concerns per patient self-inventory: None  Other: N/A  New problem(s) identified: None identified at this time.   New Short Term/Long Term Goal(s): None identified at this time.   Discharge Plan or Barriers: Pt will return home and  follow-up with outpatient services at [facility].  05/02/16: Pt will return home and follow up outpatient with [facility] [place] [facility].  Reason for Continuation of Hospitalization:  None identified at this time.  Estimated Length of Stay: 0 days  Attendees: Patient: 05/02/2016  11:00 AM  Physician: Dr. Jama Flavors 05/02/2016  11:00 AM  Nursing: Vanessa Kick RN; Meriam Sprague, RN 05/02/2016  11:00 AM  RN Care Manager: Onnie Boer, RN 05/02/2016  11:00 AM  Social Worker: Donnelly Stager, LCSWA; Vernie Shanks, LCSW 05/02/2016  11:00 AM  Recreational Therapist:  05/02/2016  11:00 AM  Other: Armandina Stammer, NP; Hillery Jacks, NP 05/02/2016  11:00 AM  Other:  05/02/2016  11:00 AM  Other: 05/02/2016  11:00 AM    Scribe for Treatment Team: Jonathon Jordan, MSW, LCSWA [phone] 05/02/2016 11:00 AM

## 2016-05-02 NOTE — Discharge Summary (Signed)
Physician Discharge Summary Note  Patient:  Wendy Maynard is an 41 y.o., female MRN:  [id number] DOB:  December 08, 1975 Patient phone:  [phone] (home)  Patient address:   [address] [facility] [place] [address],  Total Time spent with patient: 30 minutes  Date of Admission:  04/28/2016 Date of Discharge: 05/02/2016  Reason for Admission:  Suicidal thoughts   Principal Problem: MDD (major depressive disorder), recurrent severe, without psychosis [facility]) Discharge Diagnoses: Patient Active Problem List   Diagnosis Date Noted  . MDD (major depressive disorder), recurrent severe, without psychosis (HCC) [F33.2] 04/28/2016  . Chronic calculous cholecystitis [K80.10] 03/23/2013  . Cholecystitis [K81.9] 02/26/2013    Past Psychiatric History: see HPI  Past Medical History:  Past Medical History:  Diagnosis Date  . Abdominal pain in female patient 11/2012  . Anxiety   . Depression   . Diabetes mellitus without complication (HCC)   . Migraines   . Polycystic ovarian disease     Past Surgical History:  Procedure Laterality Date  . CHOLECYSTECTOMY N/A 02/26/2013   Procedure: LAPAROSCOPIC CHOLECYSTECTOMY WITH INTRAOPERATIVE CHOLANGIOGRAM;  Surgeon: Adolph Pollack, MD;  Location: [facility];  Service: General;  Laterality: N/A;  . WISDOM TOOTH EXTRACTION     Family History: History reviewed. No pertinent family history. Family Psychiatric  History: see HPI Social History:  History  Alcohol Use No    Comment: Described as episodic     History  Drug Use No    Comment: Last use 04/27/16    Social History   Social History  . Marital status: Single    Spouse name: N/A  . Number of children: N/A  . Years of education: N/A   Social History Main Topics  . Smoking status: Current Every Day Smoker    Packs/day: 0.50    Years: 8.00    Types: Cigarettes  . Smokeless tobacco: Never Used     Comment: refused  . Alcohol use No     Comment: Described as episodic  . Drug use: No      Comment: Last use 04/27/16  . Sexual activity: Not Currently    Birth control/ protection: Condom   Other Topics Concern  . None   Social History Narrative  . None    Hospital Course:  Wendy Maynard, 41 year old female, who presented to ED voluntarily for worsening depression and anxiety. Stated she had been feeling easily overwhelmed, increasing suicidal ideations, mostly passive,which she reported have been present for " months ", but had increased in frequency, with recent thoughts of crashing her car .  She stated she has been facing significant stressors, from a stressful job and poor performance.  Wendy Maynard was admitted for MDD (major depressive disorder), recurrent severe, without psychosis (HCC) and crisis management.  Patient was treated with medications with their indications listed below in detail under Medication List.  Medical problems were identified and treated as needed.  Home medications were restarted as appropriate.  Improvement was monitored by observation and Rosalyn Gess daily report of symptom reduction.  Emotional and mental status was monitored by daily self inventory reports completed by Rosalyn Gess and clinical staff.  Patient reported continued improvement, denied any new concerns.  Patient had been compliant on medications and denied side effects.  Support and encouragement was provided.    [name] was evaluated by the treatment team for stability and plans for continued recovery upon discharge.  Patient was offered further treatment options upon discharge including Residential, Intensive Outpatient  and Outpatient treatment. Patient will follow up with agency listed below for medication management and counseling.  Encouraged patient to maintain satisfactory support network and home environment.  Advised to adhere to medication compliance and outpatient treatment follow up.  Prescriptions provided.       Wendy Maynard Renorving motivation was an integral factor for scheduling further  treatment.  Employment, transportation, bed availability, health status, family support, and any pending legal issues were also considered during patient's hospital stay.  Upon completion of this admission the patient was both mentally and medically stable for discharge denying suicidal/homicidal ideation, auditory/visual/tactile hallucinations, delusional thoughts and paranoia.       Physical Findings: AIMS: Facial and Oral Movements Muscles of Facial Expression: None, normal Lips and Perioral Area: None, normal Jaw: None, normal Tongue: None, normal,Extremity Movements Upper (arms, wrists, hands, fingers): None, normal Lower (legs, knees, ankles, toes): None, normal, Trunk Movements Neck, shoulders, hips: None, normal, Overall Severity Severity of abnormal movements (highest score from questions above): None, normal Incapacitation due to abnormal movements: None, normal Patient's awareness of abnormal movements (rate only patient's report): No Awareness, Dental Status Current problems with teeth and/or dentures?: No Does patient usually wear dentures?: No  CIWA:  CIWA-Ar Total: 1 COWS:  COWS Total Score: 1  Musculoskeletal: Strength & Muscle Tone: within normal limits Gait & Station: normal Patient leans: N/A  Psychiatric Specialty Exam:  See MD SRA Physical Exam  Nursing note and vitals reviewed.   ROS  Blood pressure 111/68, pulse 77, temperature 98.2 F (36.8 C), temperature source Oral, resp. rate 16, height 5\' 7"  (1.702 m), weight 86.2 kg (190 lb), SpO2 100 %.Body mass index is 29.76 kg/m.    Have you used any form of tobacco in the last 30 days? (Cigarettes, Smokeless Tobacco, Cigars, and/or Pipes): Yes  Has this patient used any form of tobacco in the last 30 days? (Cigarettes, Smokeless Tobacco, Cigars, and/or Pipes) Yes, N/A  Blood Alcohol level:  No results found for: [facility]  Metabolic Disorder Labs:  Lab Results  Component Value Date   HGBA1C 5.4 04/28/2016   MPG  108 04/28/2016   No results found for: PROLACTIN No results found for: CHOL, TRIG, HDL, CHOLHDL, VLDL, LDLCALC  See Psychiatric Specialty Exam and Suicide Risk Assessment completed by Attending Physician prior to discharge.  Discharge destination:  Home  Is patient on multiple antipsychotic therapies at discharge:  No   Has Patient had three or more failed trials of antipsychotic monotherapy by history:  No  Recommended Plan for Multiple Antipsychotic Therapies: NA   Allergies as of 05/02/2016      Reactions   Ciprofloxacin Hives, Other (See Comments)   Started in arm as soon as drug started.   Clindamycin/lincomycin Diarrhea, Other (See Comments)   Causes excessive diarrhea   Vicodin [hydrocodone-acetaminophen] Itching, Nausea And Vomiting   Penicillins Rash, Other (See Comments)   Occurred at 41 years old Has patient had a PCN reaction causing immediate rash, facial/tongue/throat swelling, SOB or lightheadedness with hypotension: yes Has patient had a PCN reaction causing severe rash involving mucus membranes or skin necrosis: no Has patient had a PCN reaction that required hospitalization no Has patient had a PCN reaction occurring within the last 10 years:no If all of the above answers are "NO", then may proceed with Cephalosporin use.   Sulfa Antibiotics Rash      Medication List    STOP taking these medications   acetaminophen 500 MG tablet Commonly known as:  TYLENOL  ALPRAZolam 0.5 MG tablet Commonly known as:  XANAX   clotrimazole-betamethasone cream Commonly known as:  LOTRISONE   diclofenac 75 MG EC tablet Commonly known as:  VOLTAREN   methocarbamol 750 MG tablet Commonly known as:  ROBAXIN   multivitamin with minerals Tabs tablet   VITAMIN B12 PO   vitamin C 250 MG tablet Commonly known as:  ASCORBIC ACID   VYVANSE 30 MG capsule Generic drug:  lisdexamfetamine   zolpidem 10 MG tablet Commonly known as:  AMBIEN     TAKE these medications      Indication  ARIPiprazole 2 MG tablet Commonly known as:  ABILIFY Take 1 tablet (2 mg total) by mouth daily. Start taking on:  05/03/2016  Indication:  mood stabilization   estradiol 1 MG tablet Commonly known as:  ESTRACE Take 1 tablet (1 mg total) by mouth daily. Start taking on:  05/03/2016  Indication:  Deficiency of the Hormone Estrogen   hydrOXYzine 25 MG tablet Commonly known as:  ATARAX/VISTARIL Take 1 tablet (25 mg total) by mouth every 6 (six) hours as needed for anxiety.  Indication:  Anxiety Neurosis   nicotine polacrilex 2 MG gum Commonly known as:  NICORETTE Take 1 each (2 mg total) by mouth as needed for smoking cessation.  Indication:  Nicotine Addiction   sertraline 100 MG tablet Commonly known as:  ZOLOFT Take 1 tablet (100 mg total) by mouth daily. Start taking on:  05/03/2016 What changed:  medication strength  when to take this  Indication:  Major Depressive Disorder   traZODone 50 MG tablet Commonly known as:  DESYREL Take 1 tablet (50 mg total) by mouth at bedtime as needed for sleep. What changed:  when to take this  reasons to take this  Indication:  Trouble Sleeping      Follow-up Information    [facility] Follow up.   Specialty:  Behavioral Health Why:  3/26 at 3:30pm for therapy with Colon Branch 4/2 at 2:30 for medication with Corie Chiquito Contact information: [address] [place] [address] [phone]        BEHAVIORAL HEALTH INTENSIVE PSYCH. Go on 05/07/2016.   Specialty:  Behavioral Health Why:  Appointment for IOP assessment at 8:45AM. Jeri Modena with IOP will be giving you call on Friday or Monday to give you details about the appointment and answer any questions you may have. Thank you! Contact information: [id number] mc [place] [address] [phone]          Follow-up recommendations:  Activity:  as tol Diet:  as tol  Comments:  1.   Take all your medications as prescribed.   2.  Report any adverse side effects to outpatient provider. 3.  Patient instructed to not use alcohol or illegal drugs while on prescription medicines. 4.  In the event of worsening symptoms, instructed patient to call 911, the crisis hotline or go to nearest emergency room for evaluation of symptoms.  Signed: Lindwood Qua, NP [facility] 05/02/2016, 3:20 PM

## 2016-05-02 NOTE — Progress Notes (Signed)
  [facility] Discharge [facility] :  Will you be returning to the same living situation after discharge:  Yes,  pt returning home. At discharge, do you have transportation home?: Yes,  pt has access to transportation. Do you have the ability to pay for your medications: Yes,  pt has insurance,  Release of information consent forms completed and in the chart;  Patient's signature needed at discharge.  Patient to Follow up at: Follow-up Information    [facility] Follow up.   Specialty:  Behavioral Health Why:  3/26 at 3:30pm for therapy with Colon Brancharson 4/2 at 2:30 for medication with Corie ChiquitoJessica Carter Contact information: [address] [place] [id number] [phone]        BEHAVIORAL HEALTH INTENSIVE PSYCH. Go on 05/07/2016.   Specialty:  Behavioral Health Why:  Appointment for IOP assessment at 8:45AM. Jeri Modenaita Clark with IOP will be giving you call on Friday or Monday to give you details about the appointment and answer any questions you may have. Thank you! Contact information: [address] [id number] [place] [phone]          Next level of care provider [facility]:yes  Safety Planning and Suicide Prevention discussed: Yes,  with the pt.  Have you used any form of tobacco in the last 30 days? (Cigarettes, Smokeless Tobacco, Cigars, and/or Pipes): Yes  Has patient been referred to the Quitline?: Patient refused referral  Patient has been referred for addiction treatment: Yes  Jonathon JordanLynn B Kaiyden Simkin 05/02/2016, 11:04 AM

## 2016-05-02 NOTE — Progress Notes (Signed)
Discharge Note:  Patient discharged home with family member.  Patient denied SI and HI.  Denied A/V hallucinations.  Suicide prevention information given and discussed with patient who stated she understood and had no questions.  Patient stated she received all her belongings, medications, prescriptions.  Patient stated she [facility].  All required discharge information given to patient at discharge.

## 2017-11-01 ENCOUNTER — Encounter: Payer: Self-pay | Admitting: Behavioral Health

## 2017-11-21 ENCOUNTER — Encounter: Payer: Self-pay | Admitting: Psychiatry

## 2017-11-21 ENCOUNTER — Ambulatory Visit: Payer: BC Managed Care – PPO | Admitting: Psychiatry

## 2017-11-21 VITALS — BP 117/84 | HR 84

## 2017-11-21 DIAGNOSIS — F900 Attention-deficit hyperactivity disorder, predominantly inattentive type: Secondary | ICD-10-CM

## 2017-11-21 DIAGNOSIS — F3342 Major depressive disorder, recurrent, in full remission: Secondary | ICD-10-CM | POA: Diagnosis not present

## 2017-11-21 DIAGNOSIS — F4310 Post-traumatic stress disorder, unspecified: Secondary | ICD-10-CM | POA: Diagnosis not present

## 2017-11-21 DIAGNOSIS — F5101 Primary insomnia: Secondary | ICD-10-CM

## 2017-11-21 MED ORDER — AMPHETAMINE-DEXTROAMPHETAMINE 15 MG PO TABS: 15.0000 mg | ORAL_TABLET | Freq: Two times a day (BID) | ORAL | 0 refills | Status: DC

## 2017-11-21 MED ORDER — SERTRALINE HCL 100 MG PO TABS: 100.0000 mg | ORAL_TABLET | Freq: Every day | ORAL | 1 refills | Status: DC

## 2017-11-21 MED ORDER — ZOLPIDEM TARTRATE 10 MG PO TABS: 10.0000 mg | ORAL_TABLET | Freq: Every evening | ORAL | 5 refills | Status: DC | PRN

## 2017-11-21 MED ORDER — ARIPIPRAZOLE 5 MG PO TABS: ORAL_TABLET | ORAL | 1 refills | Status: DC

## 2017-11-21 NOTE — Progress Notes (Signed)
Wendy Maynard [id number] [birth year] 42 y.o.  Subjective:   Patient ID:  Wendy Maynard is a 42 y.o. (DOB 11-01-1975) female.  Chief Complaint:  Chief Complaint  Patient presents with  . Follow-up    Depression, Anxiety, and ADHD    HPI Wendy Maynard presents to the office today for follow-up of mood, anxiety, and insomnia. She denies depressed mood. Reports that she has experienced sadness in the context of grief. Denies any significant changes in mood with reducing Abilify to 1/2 tab. She reports "I've been a little anxious lately, but I think it's my job" and with mother talking about wanting to move. Reports anxiety manifests as anxious thoughts and worry. Denies physical s/s of anxiety. Reports that energy is adequate throughout the day and low by the end of the day. She reports that she is in bed at 8 pm and asleep by 9-9:30 pm. Reports that she typically gets 8 hours of sleep a night. Motivation is adequate. Appetite has been ok and reports that she may have lost some weight. Concentration is adequate and able to complete assigned work. Denies SI.    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 She and her mother continue to grieve the loss of pt's father. Mother works on the weekends which allows her some alone time and rest time. Reports that she recently went to the dentist for the first time in a few years because it causes anxiety. Reports that she is speaking up for herself more.  Medications: I have reviewed the patient's current medications.  Allergies:  Allergies  Allergen Reactions  . Ciprofloxacin Hives and Other (See Comments)    Started in arm as soon as drug started.  . Clindamycin/Lincomycin Diarrhea and Other (See Comments)    Causes excessive diarrhea  . Vicodin [Hydrocodone-Acetaminophen] Itching and Nausea And Vomiting  . Penicillins Rash and Other (See Comments)    Occurred at 42 years old Has patient had a PCN reaction causing immediate rash,  facial/tongue/throat swelling, SOB or lightheadedness with hypotension: yes Has patient had a PCN reaction causing severe rash involving mucus membranes or skin necrosis: no Has patient had a PCN reaction that required hospitalization no Has patient had a PCN reaction occurring within the last 10 years:no If all of the above answers are "NO", then may proceed with Cephalosporin use.   . Sulfa Antibiotics Rash    Past Medical History:  Diagnosis Date  . Abdominal pain in female patient 11/2012  . Anxiety   . Depression   . Diabetes mellitus without complication (HCC)   . Migraines   . Polycystic ovarian disease     Past Surgical History:  Procedure Laterality Date  . CHOLECYSTECTOMY N/A 02/26/2013   Procedure: LAPAROSCOPIC CHOLECYSTECTOMY WITH INTRAOPERATIVE CHOLANGIOGRAM;  Surgeon: Adolph Pollack, MD;  Location: [facility];  Service: General;  Laterality: N/A;  . HYSTERECTOMY ABDOMINAL WITH SALPINGECTOMY    . WISDOM TOOTH EXTRACTION      Family History  Problem Relation Age of Onset  . Depression Mother        in response to grief/loss of husband  . Hypertension Father        Died sudenly at age 49. Reports father may have had Aspergers  . Depression Sister   . Anxiety disorder Sister   . ADD / ADHD Sister   . ADD / ADHD Cousin   . Asperger's syndrome Cousin     Social History   Socioeconomic History  .  Marital status: Single    Spouse name: Not on file  . Number of children: Not on file  . Years of education: Not on file  . Highest education level: Not on file  Occupational History  . Not on file  Social Needs  . Financial resource strain: Not on file  . Food insecurity:    Worry: Not on file    Inability: Not on file  . Transportation needs:    Medical: Not on file    Non-medical: Not on file  Tobacco Use  . Smoking status: Current Every Day Smoker    Packs/day: 0.50    Years: 8.00    Pack years: 4.00    Types: Cigarettes  . Smokeless tobacco: Never  Used  . Tobacco comment: plans to start nicotine gum  Substance and Sexual Activity  . Alcohol use: No    Comment: Described as episodic  . Drug use: No    Frequency: 7.0 times per week    Types: Marijuana    Comment: Last use 04/27/16  . Sexual activity: Not Currently    Birth control/protection: Condom  Lifestyle  . Physical activity:    Days per week: Not on file    Minutes per session: Not on file  . Stress: Not on file  Relationships  . Social connections:    Talks on phone: Not on file    Gets together: Not on file    Attends religious service: Not on file    Active member of club or organization: Not on file    Attends meetings of clubs or organizations: Not on file    Relationship status: Not on file  . Intimate partner violence:    Fear of current or ex partner: Not on file    Emotionally abused: Not on file    Physically abused: Not on file    Forced sexual activity: Not on file  Other Topics Concern  . Not on file  Social History Narrative  . Not on file    Past Medical History, Surgical history, Social history, and Family history were reviewed and updated as appropriate.   Please see review of systems for further details on the patient's review from today.   Review of Systems:  Review of Systems  Constitutional: Negative.   Allergic/Immunologic: Negative.   Psychiatric/Behavioral: Negative for decreased concentration, dysphoric mood, sleep disturbance and suicidal ideas.    Objective:   Physical Exam:  BP 117/84   Pulse 84   Physical Exam  Constitutional: She is oriented to person, place, and time. She appears well-developed. No distress.  Musculoskeletal: Normal range of motion.  Neurological: She is alert and oriented to person, place, and time. Coordination normal.  Psychiatric: She has a normal mood and affect. Her speech is normal and behavior is normal. Judgment and thought content normal. Her mood appears not anxious. Her affect is not angry,  not blunt, not labile and not inappropriate. Cognition and memory are normal. She does not exhibit a depressed mood. She expresses no homicidal and no suicidal ideation. She expresses no suicidal plans and no homicidal plans.    Lab Review:     Component Value Date/Time   NA 141 04/28/2016 1836   K 3.7 04/28/2016 1836   CL 107 04/28/2016 1836   CO2 25 04/28/2016 1836   GLUCOSE 90 04/28/2016 1836   BUN 14 04/28/2016 1836   CREATININE 0.68 04/28/2016 1836   CALCIUM 9.5 04/28/2016 1836   PROT 8.3 (H) 04/28/2016  1836   ALBUMIN 4.6 04/28/2016 1836   AST 21 04/28/2016 1836   ALT 18 04/28/2016 1836   ALKPHOS 67 04/28/2016 1836   BILITOT 0.5 04/28/2016 1836   GFRNONAA >60 04/28/2016 1836   GFRAA >60 04/28/2016 1836       Component Value Date/Time   WBC 9.4 04/28/2016 1836   RBC 4.61 04/28/2016 1836   HGB 12.5 04/28/2016 1836   HCT 37.7 04/28/2016 1836   PLT 272 04/28/2016 1836   MCV 81.8 04/28/2016 1836   MCV 89.0 02/10/2013 1721   MCH 27.1 04/28/2016 1836   MCHC 33.2 04/28/2016 1836   RDW 13.8 04/28/2016 1836   LYMPHSABS 2.1 02/26/2013 0154   MONOABS 0.6 02/26/2013 0154   EOSABS 0.2 02/26/2013 0154   BASOSABS 0.0 02/26/2013 0154    No results found for: POCLITH, LITHIUM     Assessment: Plan:    Recurrent major depressive disorder, in full remission (HCC) - Plan: sertraline (ZOLOFT) 100 MG tablet, ARIPiprazole (ABILIFY) 5 MG tablet, sertraline (ZOLOFT) 100 MG tablet  Posttraumatic stress disorder - Plan: sertraline (ZOLOFT) 100 MG tablet, sertraline (ZOLOFT) 100 MG tablet  Attention deficit hyperactivity disorder (ADHD), predominantly inattentive type - Plan: amphetamine-dextroamphetamine (ADDERALL) 15 MG tablet, amphetamine-dextroamphetamine (ADDERALL) 15 MG tablet, amphetamine-dextroamphetamine (ADDERALL) 15 MG tablet  Primary insomnia - Plan: zolpidem (AMBIEN) 10 MG tablet  Please see After Visit Summary for patient specific instructions.  Future Appointments   Date Time Provider Department Center  02/27/2018  2:00 PM Corie Chiquito, PMHNP [facility] None    No orders of the defined types were placed in this encounter.     -------------------------------

## 2018-02-06 ENCOUNTER — Encounter: Payer: Self-pay | Admitting: Emergency Medicine

## 2018-02-06 DIAGNOSIS — F4310 Post-traumatic stress disorder, unspecified: Secondary | ICD-10-CM | POA: Insufficient documentation

## 2018-02-06 DIAGNOSIS — F900 Attention-deficit hyperactivity disorder, predominantly inattentive type: Secondary | ICD-10-CM | POA: Insufficient documentation

## 2018-02-27 ENCOUNTER — Ambulatory Visit: Payer: BC Managed Care – PPO | Admitting: Psychiatry

## 2018-02-27 ENCOUNTER — Encounter: Payer: Self-pay | Admitting: Psychiatry

## 2018-02-27 VITALS — BP 111/77 | HR 94

## 2018-02-27 DIAGNOSIS — F900 Attention-deficit hyperactivity disorder, predominantly inattentive type: Secondary | ICD-10-CM

## 2018-02-27 DIAGNOSIS — F5101 Primary insomnia: Secondary | ICD-10-CM

## 2018-02-27 DIAGNOSIS — F3342 Major depressive disorder, recurrent, in full remission: Secondary | ICD-10-CM

## 2018-02-27 DIAGNOSIS — F4310 Post-traumatic stress disorder, unspecified: Secondary | ICD-10-CM | POA: Diagnosis not present

## 2018-02-27 MED ORDER — ZOLPIDEM TARTRATE 10 MG PO TABS: 10.0000 mg | ORAL_TABLET | Freq: Every evening | ORAL | 5 refills | Status: DC | PRN

## 2018-02-27 MED ORDER — AMPHETAMINE-DEXTROAMPHETAMINE 15 MG PO TABS: 15.0000 mg | ORAL_TABLET | Freq: Two times a day (BID) | ORAL | 0 refills | Status: DC

## 2018-02-27 MED ORDER — ARIPIPRAZOLE 5 MG PO TABS: ORAL_TABLET | ORAL | 1 refills | Status: DC

## 2018-02-27 MED ORDER — METFORMIN HCL ER 500 MG PO TB24: ORAL_TABLET | ORAL | 1 refills | Status: DC

## 2018-02-27 MED ORDER — SERTRALINE HCL 100 MG PO TABS: 100.0000 mg | ORAL_TABLET | Freq: Every day | ORAL | 1 refills | Status: DC

## 2018-02-27 NOTE — Progress Notes (Signed)
Wendy Maynard [id number] 06/04/1975 43 y.o.  Subjective:   Patient ID:  Wendy Maynard is a 43 y.o. (DOB 06/04/1975) female.  Chief Complaint:  Chief Complaint  Patient presents with  . Follow-up    Anxiety, Depression, Insomnia   . ADD    HPI Wendy Maynard presents to the office today for follow-up of depression, anxiety, and ADHD. She reports that she is "doing ok" with grieving the loss of her father. She reports that she continues to dream about him often. Reports [place] since it was the first year her father was not alive. Reports some concern about her mother who seems to have some depression after the loss. Reports that she has been attending some grief groups with her mother. Pt denies any depression. She reports that her anxiety has been controlled overall. Reports that she has had some anxiety with awaiting feedback from supervisor after being observed. Reports that her work has been busy. She reports that sleep is "ok" with a few random nights with difficulty falling and staying asleep. She reports that her sleep improved after getting a weighted blanket for Christmas. Reports that she usually goes to bed at 9 pm and awakens around 6 am. Reports that routine has helped with her anxiety. Notices that her concentration is best around mid-morning. She reports that she has more planning time in her schedule which has been helpful. Reports adequate concentration overall. She reports that her current job allows for some structure and routine, and also have some variety that helps with her concentration. She reports that she has joined [employer] and had   Reports 70 lbs since starting Abilify.    Review of Systems:  Review of Systems  Musculoskeletal: Negative for gait problem.  Neurological: Negative for tremors.  Psychiatric/Behavioral:       Please refer to HPI    Medications: I have reviewed the patient's current medications.  Current  Outpatient Medications  Medication Sig Dispense Refill  . ARIPiprazole (ABILIFY) 5 MG tablet Take 1/2-1 po qd 90 tablet 1  . Ascorbic Acid (VITAMIN C) 100 MG tablet Take 100 mg by mouth daily.    Marland Kitchen. estradiol (ESTRACE) 1 MG tablet Take 1 tablet (1 mg total) by mouth daily. 30 tablet 0  . sertraline (ZOLOFT) 100 MG tablet Take 1 tablet (100 mg total) by mouth daily. 90 tablet 1  . zolpidem (AMBIEN) 10 MG tablet Take 1 tablet (10 mg total) by mouth at bedtime as needed for sleep. 30 tablet 5  . [START ON 04/24/2018] amphetamine-dextroamphetamine (ADDERALL) 15 MG tablet Take 1 tablet by mouth 2 (two) times daily for 30 days. 60 tablet 0  . [START ON 03/27/2018] amphetamine-dextroamphetamine (ADDERALL) 15 MG tablet Take 1 tablet by mouth 2 (two) times daily for 30 days. 60 tablet 0  . amphetamine-dextroamphetamine (ADDERALL) 15 MG tablet Take 1 tablet by mouth 2 (two) times daily for 30 days. 60 tablet 0  . fexofenadine (ALLEGRA) 60 MG tablet Take 60 mg by mouth 2 (two) times daily.    . metFORMIN (GLUCOPHAGE XR) 500 MG 24 hr tablet Take 1 tablet po qd with a meal for one week, then increase to 1 tab po BID as tolerated 90 tablet 1  . nicotine polacrilex (NICORETTE) 2 MG gum Take 1 each (2 mg total) by mouth as needed for smoking cessation. (Patient not taking: Reported on 02/27/2018) 100 tablet 0   No current facility-administered medications for this  visit.     Medication Side Effects: Other: Possible weight gain  Allergies:  Allergies  Allergen Reactions  . Ciprofloxacin Hives and Other (See Comments)    Started in arm as soon as drug started.  . Clindamycin/Lincomycin Diarrhea and Other (See Comments)    Causes excessive diarrhea  . Vicodin [Hydrocodone-Acetaminophen] Itching and Nausea And Vomiting  . Penicillins Rash and Other (See Comments)    Occurred at 43 years old Has patient had a PCN reaction causing immediate rash, facial/tongue/throat swelling, SOB or lightheadedness with  hypotension: yes Has patient had a PCN reaction causing severe rash involving mucus membranes or skin necrosis: no Has patient had a PCN reaction that required hospitalization no Has patient had a PCN reaction occurring within the last 10 years:no If all of the above answers are "NO", then may proceed with Cephalosporin use.   . Sulfa Antibiotics Rash    Past Medical History:  Diagnosis Date  . Abdominal pain in female patient 11/2012  . Anxiety   . Depression   . Diabetes mellitus without complication (HCC)   . Migraines   . Polycystic ovarian disease     Family History  Problem Relation Age of Onset  . Depression Mother        in response to grief/loss of husband  . Hypertension Father        Died sudenly at age 42. Reports father may have had Aspergers  . Depression Sister   . Anxiety disorder Sister   . ADD / ADHD Sister   . ADD / ADHD Cousin   . Asperger's syndrome Cousin     Social History   Socioeconomic History  . Marital status: Single    Spouse name: Not on file  . Number of children: Not on file  . Years of education: Not on file  . Highest education level: Not on file  Occupational History  . Not on file  Social Needs  . Financial resource strain: Not on file  . Food insecurity:    Worry: Not on file    Inability: Not on file  . Transportation needs:    Medical: Not on file    Non-medical: Not on file  Tobacco Use  . Smoking status: Current Every Day Smoker    Packs/day: 0.50    Years: 8.00    Pack years: 4.00    Types: Cigarettes  . Smokeless tobacco: Never Used  . Tobacco comment: plans to start nicotine gum  Substance and Sexual Activity  . Alcohol use: No    Comment: Described as episodic  . Drug use: No    Frequency: 7.0 times per week    Types: Marijuana    Comment: Last use 04/27/16  . Sexual activity: Not Currently    Birth control/protection: Condom  Lifestyle  . Physical activity:    Days per week: Not on file    Minutes per  session: Not on file  . Stress: Not on file  Relationships  . Social connections:    Talks on phone: Not on file    Gets together: Not on file    Attends religious service: Not on file    Active member of club or organization: Not on file    Attends meetings of clubs or organizations: Not on file    Relationship status: Not on file  . Intimate partner violence:    Fear of current or ex partner: Not on file    Emotionally abused: Not on file  Physically abused: Not on file    Forced sexual activity: Not on file  Other Topics Concern  . Not on file  Social History Narrative  . Not on file    Past Medical History, Surgical history, Social history, and Family history were reviewed and updated as appropriate.   Please see review of systems for further details on the patient's review from today.   Objective:   Physical Exam:  BP 111/77   Pulse 94   Physical Exam Constitutional:      General: She is not in acute distress.    Appearance: She is well-developed.  Musculoskeletal:        General: No deformity.  Neurological:     Mental Status: She is alert and oriented to person, place, and time.     Coordination: Coordination normal.  Psychiatric:        Mood and Affect: Mood is not anxious or depressed. Affect is not labile, blunt, angry or inappropriate.        Speech: Speech normal.        Behavior: Behavior normal.        Thought Content: Thought content normal. Thought content does not include homicidal or suicidal ideation. Thought content does not include homicidal or suicidal plan.        Judgment: Judgment normal.     Comments: Insight intact. No auditory or visual hallucinations. No delusions.      Lab Review:     Component Value Date/Time   NA 141 04/28/2016 1836   K 3.7 04/28/2016 1836   CL 107 04/28/2016 1836   CO2 25 04/28/2016 1836   GLUCOSE 90 04/28/2016 1836   BUN 14 04/28/2016 1836   CREATININE 0.68 04/28/2016 1836   CALCIUM 9.5 04/28/2016 1836    PROT 8.3 (H) 04/28/2016 1836   ALBUMIN 4.6 04/28/2016 1836   AST 21 04/28/2016 1836   ALT 18 04/28/2016 1836   ALKPHOS 67 04/28/2016 1836   BILITOT 0.5 04/28/2016 1836   GFRNONAA >60 04/28/2016 1836   GFRAA >60 04/28/2016 1836       Component Value Date/Time   WBC 9.4 04/28/2016 1836   RBC 4.61 04/28/2016 1836   HGB 12.5 04/28/2016 1836   HCT 37.7 04/28/2016 1836   PLT 272 04/28/2016 1836   MCV 81.8 04/28/2016 1836   MCV 89.0 02/10/2013 1721   MCH 27.1 04/28/2016 1836   MCHC 33.2 04/28/2016 1836   RDW 13.8 04/28/2016 1836   LYMPHSABS 2.1 02/26/2013 0154   MONOABS 0.6 02/26/2013 0154   EOSABS 0.2 02/26/2013 0154   BASOSABS 0.0 02/26/2013 0154    No results found for: POCLITH, LITHIUM   No results found for: PHENYTOIN, PHENOBARB, VALPROATE, CBMZ   .res Assessment: Plan:   Will start Metformin XR 500 mg po qd x 1 week, then increase to 1 tab po BID for weight gain due to atypical antipsychotics. Discussed option of reducing or discontinuing Abilify. Pt reports that she would prefer to continue Abilify at this time since benefits outweigh risk and mood is more stable. She reports that she may wish to consider trial of reduction in Abilify during her summer break.  Continue Abilify 5 mg 1/2 tab po qd for depression.  Continue Sertraline 100 mg po qd for depression and anxiety. Continue Ambien 10 mg po QHS prn insomnia. Continue Adderall 15 mg po BID for ADD.  Recurrent major depressive disorder, in full remission (HCC) - Plan: ARIPiprazole (ABILIFY) 5 MG tablet, sertraline (ZOLOFT) 100  MG tablet  Posttraumatic stress disorder - Plan: sertraline (ZOLOFT) 100 MG tablet  Attention deficit hyperactivity disorder (ADHD), predominantly inattentive type - Plan: amphetamine-dextroamphetamine (ADDERALL) 15 MG tablet, amphetamine-dextroamphetamine (ADDERALL) 15 MG tablet, amphetamine-dextroamphetamine (ADDERALL) 15 MG tablet  Primary insomnia - Plan: zolpidem (AMBIEN) 10 MG  tablet  Please see After Visit Summary for patient specific instructions.  Future Appointments  Date Time Provider Department Center  06/05/2018  3:30 PM Corie Chiquito, PMHNP [facility] None    No orders of the defined types were placed in this encounter.     -------------------------------

## 2018-05-30 ENCOUNTER — Other Ambulatory Visit: Payer: Self-pay | Admitting: Psychiatry

## 2018-06-05 ENCOUNTER — Ambulatory Visit (INDEPENDENT_AMBULATORY_CARE_PROVIDER_SITE_OTHER): Payer: BC Managed Care – PPO | Admitting: Psychiatry

## 2018-06-05 ENCOUNTER — Other Ambulatory Visit: Payer: Self-pay

## 2018-06-05 DIAGNOSIS — F3342 Major depressive disorder, recurrent, in full remission: Secondary | ICD-10-CM

## 2018-06-05 DIAGNOSIS — F5101 Primary insomnia: Secondary | ICD-10-CM

## 2018-06-05 DIAGNOSIS — F4310 Post-traumatic stress disorder, unspecified: Secondary | ICD-10-CM

## 2018-06-05 DIAGNOSIS — F900 Attention-deficit hyperactivity disorder, predominantly inattentive type: Secondary | ICD-10-CM | POA: Diagnosis not present

## 2018-06-05 MED ORDER — ARIPIPRAZOLE 5 MG PO TABS: ORAL_TABLET | ORAL | 1 refills | Status: DC

## 2018-06-05 MED ORDER — ZOLPIDEM TARTRATE 10 MG PO TABS: 10.0000 mg | ORAL_TABLET | Freq: Every evening | ORAL | 5 refills | Status: DC | PRN

## 2018-06-05 MED ORDER — SERTRALINE HCL 100 MG PO TABS: 100.0000 mg | ORAL_TABLET | Freq: Every day | ORAL | 1 refills | Status: DC

## 2018-06-05 MED ORDER — AMPHETAMINE-DEXTROAMPHETAMINE 15 MG PO TABS: 15.0000 mg | ORAL_TABLET | Freq: Two times a day (BID) | ORAL | 0 refills | Status: DC

## 2018-06-05 NOTE — Progress Notes (Signed)
Wendy Maynard [id number] [birth year] 43 y.o.  Virtual Visit via Video Note  I connected with Wendy Maynard on 06/05/18 at  3:30 PM EDT by a video enabled telemedicine application and verified that I am speaking with the correct person using two identifiers.   I discussed the limitations of evaluation and management by telemedicine and the availability of in person appointments. The patient expressed understanding and agreed to proceed.   I discussed the assessment and treatment plan with the patient. The patient was provided an opportunity to ask questions and all were answered. The patient agreed with the plan and demonstrated an understanding of the instructions.   The patient was advised to call back or seek an in-person evaluation if the symptoms worsen or if the condition fails to improve as anticipated.  I provided 30 minutes of non-face-to-face time during this encounter.   Corie ChiquitoJessica Huxley Shurley, PMHNP   Subjective:   Patient ID:  Wendy Maynard is a 43 y.o. (DOB [birth year]) female.  Chief Complaint:  Chief Complaint  Patient presents with  . Anxiety  . Follow-up    h/o Depression, Insomnia   Virtual Visit via Video Note  I connected with Wendy Maynard on 06/05/18 at  3:30 PM EDT by a video enabled telemedicine application and verified that I am speaking with the correct person using two identifiers.   I discussed the limitations of evaluation and management by telemedicine and the availability of in person appointments. The patient expressed understanding and agreed to proceed.  Follow Up Instructions:  I discussed the assessment and treatment plan with the patient. The patient was provided an opportunity to ask questions and all were answered. The patient agreed with the plan and demonstrated an understanding of the instructions.   The patient was advised to call back or seek an in-person evaluation if the symptoms worsen or if the condition fails to improve as anticipated.  I provided 30  minutes of non-face-to-face time during this encounter.   Corie ChiquitoJessica Flavio Lindroth, PMHNP  HPI Wendy Maynard presents to the office today for follow-up of depression, anxiety, and insomnia. She has been working remotely during the pandemic. Reports that there has been somewhat anxious with question about whether they will renew her contract and thinking about renewing her contract. She reports that her stress has been higher in some ways and lower in others. She reports that she has had a few panic attacks with changes with pandemic.   Reports that she has been staying up later. Reports that she has been more social in terms of reaching out to others. She reports that she is using coping skills to manage anxiety.   Reports that with working from home she has been around her mother more, who continues to grieve the loss of pt's father. She reports that at times this has caused her some sadness and wants to help her mother and "fix" this for her. Reports that she misses the routine of work. Has also been trying to get out and keep herself occupied, such as walking her dog. She reports that she is staying up until about 10 pm and getting up around 8 am. She has a daily 8:30 am. She reports that she has lost 20 lbs and thinking that Metformin is helpful for weight loss. Reports that she did Weight Watchers for awhile and plans on doing Weight Watchers again. Reports that she initially had some GI side effects with Metformin and this has subsided. She reports that her energy has been good.  She reports that her concentration has been adequate with Adderall. Reports that she will occasionally not take Adderall in the afternoon if she is not doing tasks that require concentration. Denies SI.   She reports that she has stopped smoking in the last week. She reports that she has experienced some mild HA's and slight irritability with stopping smoking. She reports that cravings have been manageable with nicotine gum.   Review  of Systems:  Review of Systems  Respiratory:       Reports improved cough since stopping smoking.   Musculoskeletal: Negative for gait problem.  Neurological: Positive for headaches. Negative for tremors.  Psychiatric/Behavioral:       Please refer to HPI    Medications: I have reviewed the patient's current medications.  Current Outpatient Medications  Medication Sig Dispense Refill  . ARIPiprazole (ABILIFY) 5 MG tablet Take 1/2-1 po qd 90 tablet 1  . Ascorbic Acid (VITAMIN C) 100 MG tablet Take 100 mg by mouth daily.    Marland Kitchen estradiol (ESTRACE) 1 MG tablet Take 1 tablet (1 mg total) by mouth daily. 30 tablet 0  . fexofenadine (ALLEGRA) 60 MG tablet Take 60 mg by mouth 2 (two) times daily as needed.     . metFORMIN (GLUCOPHAGE-XR) 500 MG 24 hr tablet TAKE 1 TABLET TWICE DAILY AS TOLERATED 60 tablet 1  . nicotine polacrilex (NICORETTE) 2 MG gum Take 1 each (2 mg total) by mouth as needed for smoking cessation. 100 tablet 0  . sertraline (ZOLOFT) 100 MG tablet Take 1 tablet (100 mg total) by mouth daily. 90 tablet 1  . [START ON 06/27/2018] zolpidem (AMBIEN) 10 MG tablet Take 1 tablet (10 mg total) by mouth at bedtime as needed for sleep. 30 tablet 5  . [START ON 08/11/2018] amphetamine-dextroamphetamine (ADDERALL) 15 MG tablet Take 1 tablet by mouth 2 (two) times daily for 30 days. 60 tablet 0  . [START ON 07/14/2018] amphetamine-dextroamphetamine (ADDERALL) 15 MG tablet Take 1 tablet by mouth 2 (two) times daily for 30 days. 60 tablet 0  . [START ON 06/16/2018] amphetamine-dextroamphetamine (ADDERALL) 15 MG tablet Take 1 tablet by mouth 2 (two) times daily for 30 days. 60 tablet 0   No current facility-administered medications for this visit.     Medication Side Effects: None  Allergies:  Allergies  Allergen Reactions  . Ciprofloxacin Hives and Other (See Comments)    Started in arm as soon as drug started.  . Clindamycin/Lincomycin Diarrhea and Other (See Comments)    Causes excessive  diarrhea  . Vicodin [Hydrocodone-Acetaminophen] Itching and Nausea And Vomiting  . Penicillins Rash and Other (See Comments)    Occurred at 43 years old Has patient had a PCN reaction causing immediate rash, facial/tongue/throat swelling, SOB or lightheadedness with hypotension: yes Has patient had a PCN reaction causing severe rash involving mucus membranes or skin necrosis: no Has patient had a PCN reaction that required hospitalization no Has patient had a PCN reaction occurring within the last 10 years:no If all of the above answers are "NO", then may proceed with Cephalosporin use.   . Sulfa Antibiotics Rash    Past Medical History:  Diagnosis Date  . Abdominal pain in female patient 11/2012  . Anxiety   . Depression   . Diabetes mellitus without complication (HCC)   . Migraines   . Polycystic ovarian disease     Family History  Problem Relation Age of Onset  . Depression Mother        in  response to grief/loss of husband  . Hypertension Father        Died sudenly at age 64. Reports father may have had Aspergers  . Depression Sister   . Anxiety disorder Sister   . ADD / ADHD Sister   . ADD / ADHD Cousin   . Asperger's syndrome Cousin     Social History   Socioeconomic History  . Marital status: Single    Spouse name: Not on file  . Number of children: Not on file  . Years of education: Not on file  . Highest education level: Not on file  Occupational History  . Not on file  Social Needs  . Financial resource strain: Not on file  . Food insecurity:    Worry: Not on file    Inability: Not on file  . Transportation needs:    Medical: Not on file    Non-medical: Not on file  Tobacco Use  . Smoking status: Current Every Day Smoker    Packs/day: 0.50    Years: 8.00    Pack years: 4.00    Types: Cigarettes  . Smokeless tobacco: Never Used  . Tobacco comment: plans to start nicotine gum  Substance and Sexual Activity  . Alcohol use: No    Comment:  Described as episodic  . Drug use: No    Frequency: 7.0 times per week    Types: Marijuana    Comment: Last use 04/27/16  . Sexual activity: Not Currently    Birth control/protection: Condom  Lifestyle  . Physical activity:    Days per week: Not on file    Minutes per session: Not on file  . Stress: Not on file  Relationships  . Social connections:    Talks on phone: Not on file    Gets together: Not on file    Attends religious service: Not on file    Active member of club or organization: Not on file    Attends meetings of clubs or organizations: Not on file    Relationship status: Not on file  . Intimate partner violence:    Fear of current or ex partner: Not on file    Emotionally abused: Not on file    Physically abused: Not on file    Forced sexual activity: Not on file  Other Topics Concern  . Not on file  Social History Narrative  . Not on file    Past Medical History, Surgical history, Social history, and Family history were reviewed and updated as appropriate.   Please see review of systems for further details on the patient's review from today.   Objective:   Physical Exam:  BP 128/79   Pulse 87   Physical Exam Neurological:     Mental Status: She is alert and oriented to person, place, and time.     Cranial Nerves: No dysarthria.  Psychiatric:        Attention and Perception: Attention normal.        Speech: Speech normal.        Behavior: Behavior is cooperative.        Thought Content: Thought content normal. Thought content is not paranoid or delusional. Thought content does not include homicidal or suicidal ideation. Thought content does not include homicidal or suicidal plan.        Cognition and Memory: Cognition and memory normal.        Judgment: Judgment normal.     Comments: Mildly anxious  Lab Review:     Component Value Date/Time   NA 141 04/28/2016 1836   K 3.7 04/28/2016 1836   CL 107 04/28/2016 1836   CO2 25 04/28/2016 1836    GLUCOSE 90 04/28/2016 1836   BUN 14 04/28/2016 1836   CREATININE 0.68 04/28/2016 1836   CALCIUM 9.5 04/28/2016 1836   PROT 8.3 (H) 04/28/2016 1836   ALBUMIN 4.6 04/28/2016 1836   AST 21 04/28/2016 1836   ALT 18 04/28/2016 1836   ALKPHOS 67 04/28/2016 1836   BILITOT 0.5 04/28/2016 1836   GFRNONAA >60 04/28/2016 1836   GFRAA >60 04/28/2016 1836       Component Value Date/Time   WBC 9.4 04/28/2016 1836   RBC 4.61 04/28/2016 1836   HGB 12.5 04/28/2016 1836   HCT 37.7 04/28/2016 1836   PLT 272 04/28/2016 1836   MCV 81.8 04/28/2016 1836   MCV 89.0 02/10/2013 1721   MCH 27.1 04/28/2016 1836   MCHC 33.2 04/28/2016 1836   RDW 13.8 04/28/2016 1836   LYMPHSABS 2.1 02/26/2013 0154   MONOABS 0.6 02/26/2013 0154   EOSABS 0.2 02/26/2013 0154   BASOSABS 0.0 02/26/2013 0154    No results found for: POCLITH, LITHIUM   No results found for: PHENYTOIN, PHENOBARB, VALPROATE, CBMZ   .res Assessment: Plan:   Pt reports that she would like to continue current medications at this time. Discussed contacting office if anxiety worsens.  Continue Adderall 15 mg po BID for ADD. Continue Abilify 5 mg 1/2 tab po qd for depression. Continue Sertraline 100 mg po qd for depression and anxiety. Continue Ambien 10 mg po QHS prn insomnia.  Pt to f/u in 3 months or sooner if clinically indicated.  Patient advised to contact office with any questions, adverse effects, or acute worsening in signs and symptoms.   Attention deficit hyperactivity disorder (ADHD), predominantly inattentive type - Plan: amphetamine-dextroamphetamine (ADDERALL) 15 MG tablet, amphetamine-dextroamphetamine (ADDERALL) 15 MG tablet, amphetamine-dextroamphetamine (ADDERALL) 15 MG tablet  Recurrent major depressive disorder, in full remission (HCC) - Plan: ARIPiprazole (ABILIFY) 5 MG tablet, sertraline (ZOLOFT) 100 MG tablet  Posttraumatic stress disorder - Plan: sertraline (ZOLOFT) 100 MG tablet  Primary insomnia - Plan:  zolpidem (AMBIEN) 10 MG tablet  Please see After Visit Summary for patient specific instructions.  No future appointments.  No orders of the defined types were placed in this encounter.     -------------------------------

## 2018-06-07 ENCOUNTER — Encounter: Payer: Self-pay | Admitting: Psychiatry

## 2018-06-22 ENCOUNTER — Other Ambulatory Visit: Payer: Self-pay | Admitting: Psychiatry

## 2018-07-17 ENCOUNTER — Other Ambulatory Visit: Payer: Self-pay | Admitting: Psychiatry

## 2018-08-17 ENCOUNTER — Other Ambulatory Visit: Payer: Self-pay | Admitting: Psychiatry

## 2018-09-18 ENCOUNTER — Other Ambulatory Visit: Payer: Self-pay | Admitting: Psychiatry

## 2018-09-23 ENCOUNTER — Encounter: Payer: Self-pay | Admitting: Psychiatry

## 2018-09-23 ENCOUNTER — Other Ambulatory Visit: Payer: Self-pay

## 2018-09-23 ENCOUNTER — Ambulatory Visit (INDEPENDENT_AMBULATORY_CARE_PROVIDER_SITE_OTHER): Payer: BC Managed Care – PPO | Admitting: Psychiatry

## 2018-09-23 DIAGNOSIS — F4310 Post-traumatic stress disorder, unspecified: Secondary | ICD-10-CM | POA: Diagnosis not present

## 2018-09-23 DIAGNOSIS — F3342 Major depressive disorder, recurrent, in full remission: Secondary | ICD-10-CM | POA: Diagnosis not present

## 2018-09-23 DIAGNOSIS — F900 Attention-deficit hyperactivity disorder, predominantly inattentive type: Secondary | ICD-10-CM | POA: Diagnosis not present

## 2018-09-23 MED ORDER — AMPHETAMINE-DEXTROAMPHETAMINE 15 MG PO TABS: 15.0000 mg | ORAL_TABLET | Freq: Two times a day (BID) | ORAL | 0 refills | Status: DC

## 2018-09-23 MED ORDER — ARIPIPRAZOLE 5 MG PO TABS: ORAL_TABLET | ORAL | 1 refills | Status: DC

## 2018-09-23 MED ORDER — SERTRALINE HCL 100 MG PO TABS: 100.0000 mg | ORAL_TABLET | Freq: Every day | ORAL | 1 refills | Status: DC

## 2018-09-23 NOTE — Progress Notes (Signed)
Wendy Maynard [id number] 1975-03-17 43 y.o.  Virtual Visit via Video Note  I connected with pt on 09/23/18 and verified that I am speaking with the correct person using two identifiers.   I discussed the limitations of evaluation and management by telemedicine and the availability of in person appointments. The patient expressed understanding and agreed to proceed.  I discussed the assessment and treatment plan with the patient. The patient was provided an opportunity to ask questions and all were answered. The patient agreed with the plan and demonstrated an understanding of the instructions.   The patient was advised to call back or seek an in-person evaluation if the symptoms worsen or if the condition fails to improve as anticipated.  I provided 30 minutes of non-face-to-face time during this encounter.  The patient was located at home.  [facility]   Corie ChiquitoJessica Aquinnah Devin, PMHNP   Subjective:   Patient ID:  Wendy Maynard is a 43 y.o. (DOB 1975-03-17) female.  Chief Complaint:  Chief Complaint  Patient presents with  . Follow-up    Anxiety, Depression, ADD, insomnia    HPI Wendy Maynard presents for follow-up of depression, anxiety, ADD, and insomnia. She reports that she had some anxiety with uncertainty about work and that anxiety has been better now that she knows a little more about plan to return to school.Reports that she had worry and some physical s/s with anxiety to include shakiness and racing thoughts. Has increased Abilify to 5 mg po qd from 2.5 mg po qd over the last week for increased anxiety and thinks that this has been helpful for her anxiety. She reports that her anxiety is now manageable. She reports that her mood has been good overall. Reports feeling somewhat "apathetic" over the summer and "didn't care about anything." She reports that she may have "shut down" due to feeling overwhelmed by multiple stressors. Starting to care about things  again and feels more hopeful. Reports that her energy and motivation were lower and these s/s have resolved. Reports that she is enjoying things again and is increasing self care. She reports that her concentration has been good and was able to focus on statistics course. Notices improved concentration since increase in Abilify. She reports that she was not sleeping well with both sleep initiation and maintenance and this has improved recently. Reports that her appetite has been ok. Reports that her weight has fluctuated some and remaining around the same overall. Reports that she quit smoking for a short period and then started smoking again. Reports that she is smoking less overall. Denies SI.   Took statistics class over the summer. Mother has started talking to a man and this is the first time she has been dating since pt's father's death. Considering seeing a counselor. Has returned to church and started codependency group.   Review of Systems:  Review of Systems  Musculoskeletal: Negative for gait problem.  Neurological: Negative for tremors.  Psychiatric/Behavioral:       Please refer to HPI    Medications: I have reviewed the patient's current medications.  Current Outpatient Medications  Medication Sig Dispense Refill  . ARIPiprazole (ABILIFY) 5 MG tablet Take 1/2-1 po qd 90 tablet 1  . Ascorbic Acid (VITAMIN C) 100 MG tablet Take 100 mg by mouth daily.    . diclofenac (VOLTAREN) 75 MG EC tablet 2 (two) times daily as needed.     Marland Kitchen. estradiol (ESTRACE) 1 MG tablet Take 1 tablet (1 mg  total) by mouth daily. 30 tablet 0  . Melatonin 10 MG CAPS Take by mouth.    . metFORMIN (GLUCOPHAGE-XR) 500 MG 24 hr tablet TAKE 1 TABLET BY MOUTH TWICE A DAY AS TOLERATED 60 tablet 0  . sertraline (ZOLOFT) 100 MG tablet Take 1 tablet (100 mg total) by mouth daily. 90 tablet 1  . zolpidem (AMBIEN) 10 MG tablet Take 1 tablet (10 mg total) by mouth at bedtime as needed for sleep. 30 tablet 5  . [START ON  11/18/2018] amphetamine-dextroamphetamine (ADDERALL) 15 MG tablet Take 1 tablet by mouth 2 (two) times daily. 60 tablet 0  . [START ON 10/21/2018] amphetamine-dextroamphetamine (ADDERALL) 15 MG tablet Take 1 tablet by mouth 2 (two) times daily. 60 tablet 0  . amphetamine-dextroamphetamine (ADDERALL) 15 MG tablet Take 1 tablet by mouth 2 (two) times daily. 60 tablet 0  . fexofenadine (ALLEGRA) 60 MG tablet Take 60 mg by mouth 2 (two) times daily as needed.     . nicotine polacrilex (NICORETTE) 2 MG gum Take 1 each (2 mg total) by mouth as needed for smoking cessation. 100 tablet 0   No current facility-administered medications for this visit.     Medication Side Effects: None  Allergies:  Allergies  Allergen Reactions  . Ciprofloxacin Hives and Other (See Comments)    Started in arm as soon as drug started.  . Clindamycin/Lincomycin Diarrhea and Other (See Comments)    Causes excessive diarrhea  . Vicodin [Hydrocodone-Acetaminophen] Itching and Nausea And Vomiting  . Penicillins Rash and Other (See Comments)    Occurred at 43 years old Has patient had a PCN reaction causing immediate rash, facial/tongue/throat swelling, SOB or lightheadedness with hypotension: yes Has patient had a PCN reaction causing severe rash involving mucus membranes or skin necrosis: no Has patient had a PCN reaction that required hospitalization no Has patient had a PCN reaction occurring within the last 10 years:no If all of the above answers are "NO", then may proceed with Cephalosporin use.   . Sulfa Antibiotics Rash    Past Medical History:  Diagnosis Date  . Abdominal pain in female patient 11/2012  . Anxiety   . Depression   . Diabetes mellitus without complication ([place])   . Migraines   . Polycystic ovarian disease     Family History  Problem Relation Age of Onset  . Depression Mother        in response to grief/loss of husband  . Hypertension Father        Died sudenly at age 46. Reports father  may have had Aspergers  . Depression Sister   . Anxiety disorder Sister   . ADD / ADHD Sister   . ADD / ADHD Cousin   . Asperger's syndrome Cousin     Social History   Socioeconomic History  . Marital status: Single    Spouse name: Not on file  . Number of children: Not on file  . Years of education: Not on file  . Highest education level: Not on file  Occupational History  . Not on file  Social Needs  . Financial resource strain: Not on file  . Food insecurity    Worry: Not on file    Inability: Not on file  . Transportation needs    Medical: Not on file    Non-medical: Not on file  Tobacco Use  . Smoking status: Current Every Day Smoker    Packs/day: 0.50    Years: 8.00    Pack  years: 4.00    Types: Cigarettes  . Smokeless tobacco: Never Used  . Tobacco comment: plans to start nicotine gum  Substance and Sexual Activity  . Alcohol use: No    Comment: Described as episodic  . Drug use: No    Frequency: 7.0 times per week    Types: Marijuana    Comment: Last use 04/27/16  . Sexual activity: Not Currently    Birth control/protection: Condom  Lifestyle  . Physical activity    Days per week: Not on file    Minutes per session: Not on file  . Stress: Not on file  Relationships  . Social Musicianconnections    Talks on phone: Not on file    Gets together: Not on file    Attends religious service: Not on file    Active member of club or organization: Not on file    Attends meetings of clubs or organizations: Not on file    Relationship status: Not on file  . Intimate partner violence    Fear of current or ex partner: Not on file    Emotionally abused: Not on file    Physically abused: Not on file    Forced sexual activity: Not on file  Other Topics Concern  . Not on file  Social History Narrative  . Not on file    Past Medical History, Surgical history, Social history, and Family history were reviewed and updated as appropriate.   Please see review of systems for  further details on the patient's review from today.   Objective:   Physical Exam:  Wt 235 lb (106.6 kg)   BMI 36.81 kg/m  Reports that BP and pulse have been within normal range Physical Exam Neurological:     Mental Status: She is alert and oriented to person, place, and time.     Cranial Nerves: No dysarthria.  Psychiatric:        Attention and Perception: Attention normal.        Mood and Affect: Mood normal.        Speech: Speech normal.        Behavior: Behavior is cooperative.        Thought Content: Thought content normal. Thought content is not paranoid or delusional. Thought content does not include homicidal or suicidal ideation. Thought content does not include homicidal or suicidal plan.        Cognition and Memory: Cognition and memory normal.        Judgment: Judgment normal.     Lab Review:     Component Value Date/Time   NA 141 04/28/2016 1836   K 3.7 04/28/2016 1836   CL 107 04/28/2016 1836   CO2 25 04/28/2016 1836   GLUCOSE 90 04/28/2016 1836   BUN 14 04/28/2016 1836   CREATININE 0.68 04/28/2016 1836   CALCIUM 9.5 04/28/2016 1836   PROT 8.3 (H) 04/28/2016 1836   ALBUMIN 4.6 04/28/2016 1836   AST 21 04/28/2016 1836   ALT 18 04/28/2016 1836   ALKPHOS 67 04/28/2016 1836   BILITOT 0.5 04/28/2016 1836   GFRNONAA >60 04/28/2016 1836   GFRAA >60 04/28/2016 1836       Component Value Date/Time   WBC 9.4 04/28/2016 1836   RBC 4.61 04/28/2016 1836   HGB 12.5 04/28/2016 1836   HCT 37.7 04/28/2016 1836   PLT 272 04/28/2016 1836   MCV 81.8 04/28/2016 1836   MCV 89.0 02/10/2013 1721   MCH 27.1 04/28/2016 1836   MCHC  33.2 04/28/2016 1836   RDW 13.8 04/28/2016 1836   LYMPHSABS 2.1 02/26/2013 0154   MONOABS 0.6 02/26/2013 0154   EOSABS 0.2 02/26/2013 0154   BASOSABS 0.0 02/26/2013 0154    No results found for: POCLITH, LITHIUM   No results found for: PHENYTOIN, PHENOBARB, VALPROATE, CBMZ   .res Assessment: Plan:   Continue current plan of care. Pt  to f/u in 3 months or sooner if clinically indicated.  Patient advised to contact office with any questions, adverse effects, or acute worsening in signs and symptoms.  Zari was seen today for follow-up.  Diagnoses and all orders for this visit:  Attention deficit hyperactivity disorder (ADHD), predominantly inattentive type -     amphetamine-dextroamphetamine (ADDERALL) 15 MG tablet; Take 1 tablet by mouth 2 (two) times daily. -     amphetamine-dextroamphetamine (ADDERALL) 15 MG tablet; Take 1 tablet by mouth 2 (two) times daily. -     amphetamine-dextroamphetamine (ADDERALL) 15 MG tablet; Take 1 tablet by mouth 2 (two) times daily.  Recurrent major depressive disorder, in full remission (HCC) -     ARIPiprazole (ABILIFY) 5 MG tablet; Take 1/2-1 po qd -     sertraline (ZOLOFT) 100 MG tablet; Take 1 tablet (100 mg total) by mouth daily.  Posttraumatic stress disorder -     sertraline (ZOLOFT) 100 MG tablet; Take 1 tablet (100 mg total) by mouth daily.     Please see After Visit Summary for patient specific instructions.  No future appointments.  No orders of the defined types were placed in this encounter.     -------------------------------

## 2018-10-13 ENCOUNTER — Other Ambulatory Visit: Payer: Self-pay | Admitting: Psychiatry

## 2018-12-27 ENCOUNTER — Other Ambulatory Visit: Payer: Self-pay | Admitting: Psychiatry

## 2018-12-27 DIAGNOSIS — F5101 Primary insomnia: Secondary | ICD-10-CM

## 2018-12-28 ENCOUNTER — Other Ambulatory Visit: Payer: Self-pay

## 2018-12-28 ENCOUNTER — Telehealth: Payer: Self-pay | Admitting: Psychiatry

## 2018-12-28 DIAGNOSIS — F900 Attention-deficit hyperactivity disorder, predominantly inattentive type: Secondary | ICD-10-CM

## 2018-12-28 MED ORDER — AMPHETAMINE-DEXTROAMPHETAMINE 15 MG PO TABS: 15.0000 mg | ORAL_TABLET | Freq: Two times a day (BID) | ORAL | 0 refills | Status: DC

## 2018-12-28 NOTE — Telephone Encounter (Signed)
Last refill 10/15 due back for apt now adderall pended also

## 2018-12-28 NOTE — Telephone Encounter (Signed)
Pt would like a refill on Ambien and Adderall 15mg . Please send to [facility] on [address] [place], [place].

## 2018-12-29 NOTE — Telephone Encounter (Signed)
LM 12/29/18 to call for follow up for continued medication refills.

## 2019-01-05 ENCOUNTER — Ambulatory Visit: Payer: BC Managed Care – PPO | Admitting: Psychiatry

## 2019-01-13 ENCOUNTER — Encounter: Payer: Self-pay | Admitting: Psychiatry

## 2019-01-13 ENCOUNTER — Other Ambulatory Visit: Payer: Self-pay

## 2019-01-13 ENCOUNTER — Ambulatory Visit (INDEPENDENT_AMBULATORY_CARE_PROVIDER_SITE_OTHER): Payer: BC Managed Care – PPO | Admitting: Psychiatry

## 2019-01-13 DIAGNOSIS — F4310 Post-traumatic stress disorder, unspecified: Secondary | ICD-10-CM

## 2019-01-13 DIAGNOSIS — F3342 Major depressive disorder, recurrent, in full remission: Secondary | ICD-10-CM | POA: Diagnosis not present

## 2019-01-13 DIAGNOSIS — F900 Attention-deficit hyperactivity disorder, predominantly inattentive type: Secondary | ICD-10-CM | POA: Diagnosis not present

## 2019-01-13 DIAGNOSIS — F5101 Primary insomnia: Secondary | ICD-10-CM | POA: Diagnosis not present

## 2019-01-13 MED ORDER — PROPRANOLOL HCL 10 MG PO TABS: ORAL_TABLET | ORAL | 1 refills | Status: DC

## 2019-01-13 MED ORDER — AMPHETAMINE-DEXTROAMPHETAMINE 15 MG PO TABS: 15.0000 mg | ORAL_TABLET | Freq: Two times a day (BID) | ORAL | 0 refills | Status: DC

## 2019-01-13 MED ORDER — ZOLPIDEM TARTRATE 10 MG PO TABS: 10.0000 mg | ORAL_TABLET | Freq: Every evening | ORAL | 0 refills | Status: DC | PRN

## 2019-01-13 MED ORDER — SERTRALINE HCL 100 MG PO TABS: 150.0000 mg | ORAL_TABLET | Freq: Every day | ORAL | 0 refills | Status: DC

## 2019-01-13 NOTE — Progress Notes (Signed)
Wendy Maynard [id number] Jan 08, 1976 43 y.o.  Virtual Visit via Telephone Note  I connected with pt on 01/15/19 at  8:30 AM EST by telephone and verified that I am speaking with the correct person using two identifiers.   I discussed the limitations, risks, security and privacy concerns of performing an evaluation and management service by telephone and the availability of in person appointments. I also discussed with the patient that there may be a patient responsible charge related to this service. The patient expressed understanding and agreed to proceed.   I discussed the assessment and treatment plan with the patient. The patient was provided an opportunity to ask questions and all were answered. The patient agreed with the plan and demonstrated an understanding of the instructions.   The patient was advised to call back or seek an in-person evaluation if the symptoms worsen or if the condition fails to improve as anticipated.  I provided 25 minutes of non-face-to-face time during this encounter.  The patient was located at home.  The provider was located at [facility].   Wendy Maynard, PMHNP   Subjective:   Patient ID:  Wendy Maynard is a 43 y.o. (DOB Jan 08, 1976) female.  Chief Complaint:  Chief Complaint  Patient presents with  . Anxiety    HPI Wendy Maynard presents for follow-up of depression, anxiety, ADD, and insomnia. She reports that she has had some anxiety in response to multiple stressors and changes. She reports that she has had a few panic attacks and at times experiencing increased HR and shaking. Has some anxiety re: upcoming evaluation at work. 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 Reports feeling overwhelmed at times. Reports that she is having some nightmares a few times a week. Denies difficulty falling asleep. She reports occ early morning awakenings. Estimates sleeping 6-8 hours a night,  and typically 8 hours. Denies any change in appetite. Energy and motivation have been good. Concentration has been adequate and typically uses lists to help. Takes Adderall most days. Denies SI.   Denies depressed mood.   She reports that her work is going well and she has received positive feedback from principal.   Review of Systems:  Review of Systems  Musculoskeletal: Negative for gait problem.  Neurological: Negative for tremors.  Psychiatric/Behavioral:       Please refer to HPI    Medications: I have reviewed the patient's current medications.  Current Outpatient Medications  Medication Sig Dispense Refill  . [START ON 03/22/2019] amphetamine-dextroamphetamine (ADDERALL) 15 MG tablet Take 1 tablet by mouth 2 (two) times daily. 60 tablet 0  . [START ON 02/22/2019] amphetamine-dextroamphetamine (ADDERALL) 15 MG tablet Take 1 tablet by mouth 2 (two) times daily. 60 tablet 0  . [START ON 01/25/2019] amphetamine-dextroamphetamine (ADDERALL) 15 MG tablet Take 1 tablet by mouth 2 (two) times daily. 60 tablet 0  . ARIPiprazole (ABILIFY) 5 MG tablet Take 1/2-1 po qd 90 tablet 1  . Ascorbic Acid (VITAMIN C) 100 MG tablet Take 100 mg by mouth daily.    . diclofenac (VOLTAREN) 75 MG EC tablet 2 (two) times daily as needed.     Marland Kitchen. estradiol (ESTRACE) 1 MG tablet Take 1 tablet (1 mg total) by mouth daily. 30 tablet 0  . fexofenadine (ALLEGRA) 60 MG tablet Take 60 mg by mouth 2 (two) times daily as needed.     . Melatonin 10 MG CAPS Take by mouth.    . metFORMIN (GLUCOPHAGE-XR)  500 MG 24 hr tablet TAKE 1 TABLET BY MOUTH TWICE A DAY AS TOLERATED 60 tablet 3  . nicotine polacrilex (NICORETTE) 2 MG gum Take 1 each (2 mg total) by mouth as needed for smoking cessation. 100 tablet 0  . propranolol (INDERAL) 10 MG tablet Take 1-2 tabs po BID prn anxiety 120 tablet 1  . sertraline (ZOLOFT) 100 MG tablet Take 1.5 tablets (150 mg total) by mouth daily. 135 tablet 0  . [START ON 02/22/2019] zolpidem  (AMBIEN) 10 MG tablet Take 1 tablet (10 mg total) by mouth at bedtime as needed for sleep. 30 tablet 0   No current facility-administered medications for this visit.     Medication Side Effects: None  Allergies:  Allergies  Allergen Reactions  . Ciprofloxacin Hives and Other (See Comments)    Started in arm as soon as drug started.  . Clindamycin/Lincomycin Diarrhea and Other (See Comments)    Causes excessive diarrhea  . Vicodin [Hydrocodone-Acetaminophen] Itching and Nausea And Vomiting  . Penicillins Rash and Other (See Comments)    Occurred at 43 years old Has patient had a PCN reaction causing immediate rash, facial/tongue/throat swelling, SOB or lightheadedness with hypotension: yes Has patient had a PCN reaction causing severe rash involving mucus membranes or skin necrosis: no Has patient had a PCN reaction that required hospitalization no Has patient had a PCN reaction occurring within the last 10 years:no If all of the above answers are "NO", then may proceed with Cephalosporin use.   . Sulfa Antibiotics Rash    Past Medical History:  Diagnosis Date  . Abdominal pain in female patient 11/2012  . Anxiety   . Depression   . Diabetes mellitus without complication (HCC)   . Migraines   . Polycystic ovarian disease     Family History  Problem Relation Age of Onset  . Depression Mother        in response to grief/loss of husband  . Hypertension Father        Died sudenly at age 50. Reports father may have had Aspergers  . Depression Sister   . Anxiety disorder Sister   . ADD / ADHD Sister   . ADD / ADHD Cousin   . Asperger's syndrome Cousin     Social History   Socioeconomic History  . Marital status: Single    Spouse name: Not on file  . Number of children: Not on file  . Years of education: Not on file  . Highest education level: Not on file  Occupational History  . Not on file  Social Needs  . Financial resource strain: Not on file  . Food  insecurity    Worry: Not on file    Inability: Not on file  . Transportation needs    Medical: Not on file    Non-medical: Not on file  Tobacco Use  . Smoking status: Current Every Day Smoker    Packs/day: 0.50    Years: 8.00    Pack years: 4.00    Types: Cigarettes  . Smokeless tobacco: Never Used  . Tobacco comment: plans to start nicotine gum  Substance and Sexual Activity  . Alcohol use: No    Comment: Described as episodic  . Drug use: No    Frequency: 7.0 times per week    Types: Marijuana    Comment: Last use 04/27/16  . Sexual activity: Not Currently    Birth control/protection: Condom  Lifestyle  . Physical activity    Days  per week: Not on file    Minutes per session: Not on file  . Stress: Not on file  Relationships  . Social Musician on phone: Not on file    Gets together: Not on file    Attends religious service: Not on file    Active member of club or organization: Not on file    Attends meetings of clubs or organizations: Not on file    Relationship status: Not on file  . Intimate partner violence    Fear of current or ex partner: Not on file    Emotionally abused: Not on file    Physically abused: Not on file    Forced sexual activity: Not on file  Other Topics Concern  . Not on file  Social History Narrative  . Not on file    Past Medical History, Surgical history, Social history, and Family history were reviewed and updated as appropriate.   Please see review of systems for further details on the patient's review from today.   Objective:   Physical Exam:  There were no vitals taken for this visit.  Physical Exam Neurological:     Mental Status: She is alert and oriented to person, place, and time.     Cranial Nerves: No dysarthria.  Psychiatric:        Attention and Perception: Attention normal.        Mood and Affect: Mood normal.        Speech: Speech normal.        Behavior: Behavior is cooperative.        Thought  Content: Thought content normal. Thought content is not paranoid or delusional. Thought content does not include homicidal or suicidal ideation. Thought content does not include homicidal or suicidal plan.        Cognition and Memory: Cognition and memory normal.        Judgment: Judgment normal.     Comments: Insight intact     Lab Review:     Component Value Date/Time   NA 141 04/28/2016 1836   K 3.7 04/28/2016 1836   CL 107 04/28/2016 1836   CO2 25 04/28/2016 1836   GLUCOSE 90 04/28/2016 1836   BUN 14 04/28/2016 1836   CREATININE 0.68 04/28/2016 1836   CALCIUM 9.5 04/28/2016 1836   PROT 8.3 (H) 04/28/2016 1836   ALBUMIN 4.6 04/28/2016 1836   AST 21 04/28/2016 1836   ALT 18 04/28/2016 1836   ALKPHOS 67 04/28/2016 1836   BILITOT 0.5 04/28/2016 1836   GFRNONAA >60 04/28/2016 1836   GFRAA >60 04/28/2016 1836       Component Value Date/Time   WBC 9.4 04/28/2016 1836   RBC 4.61 04/28/2016 1836   HGB 12.5 04/28/2016 1836   HCT 37.7 04/28/2016 1836   PLT 272 04/28/2016 1836   MCV 81.8 04/28/2016 1836   MCV 89.0 02/10/2013 1721   MCH 27.1 04/28/2016 1836   MCHC 33.2 04/28/2016 1836   RDW 13.8 04/28/2016 1836   LYMPHSABS 2.1 02/26/2013 0154   MONOABS 0.6 02/26/2013 0154   EOSABS 0.2 02/26/2013 0154   BASOSABS 0.0 02/26/2013 0154    No results found for: POCLITH, LITHIUM   No results found for: PHENYTOIN, PHENOBARB, VALPROATE, CBMZ   .res Assessment: Plan:   Pt seen for 25 minutes and greater than 50% of session spent counseling pt re: potential benefits, risks, and side effects of increase in Sertraline to 150 mg po qd to further  improve mood and anxiety s/s. Pt agrees to increase in Sertraline.   Discussed potential benefits, risks, and side effects of Propranolol prn anxiety. Pt agrees to trial of Propranolol prn. Continue Adderall 15 mg BID for ADD.  Continue Ambien 10 mg po QHS prn insomnia.  Pt to f/u in 3 months or sooner if clinically indicated.  Patient  advised to contact office with any questions, adverse effects, or acute worsening in signs and symptoms.   Xcaret was seen today for anxiety.  Diagnoses and all orders for this visit:  Recurrent major depressive disorder, in full remission ([place]) -     sertraline (ZOLOFT) 100 MG tablet; Take 1.5 tablets (150 mg total) by mouth daily.  Posttraumatic stress disorder -     sertraline (ZOLOFT) 100 MG tablet; Take 1.5 tablets (150 mg total) by mouth daily. -     propranolol (INDERAL) 10 MG tablet; Take 1-2 tabs po BID prn anxiety  Attention deficit hyperactivity disorder (ADHD), predominantly inattentive type -     amphetamine-dextroamphetamine (ADDERALL) 15 MG tablet; Take 1 tablet by mouth 2 (two) times daily. -     amphetamine-dextroamphetamine (ADDERALL) 15 MG tablet; Take 1 tablet by mouth 2 (two) times daily. -     amphetamine-dextroamphetamine (ADDERALL) 15 MG tablet; Take 1 tablet by mouth 2 (two) times daily.  Primary insomnia -     zolpidem (AMBIEN) 10 MG tablet; Take 1 tablet (10 mg total) by mouth at bedtime as needed for sleep.    Please see After Visit Summary for patient specific instructions.  No future appointments.  No orders of the defined types were placed in this encounter.     -------------------------------

## 2019-01-29 ENCOUNTER — Other Ambulatory Visit: Payer: Self-pay | Admitting: Psychiatry

## 2019-02-12 ENCOUNTER — Other Ambulatory Visit: Payer: Self-pay | Admitting: Psychiatry

## 2019-02-12 DIAGNOSIS — F4310 Post-traumatic stress disorder, unspecified: Secondary | ICD-10-CM

## 2019-02-27 ENCOUNTER — Other Ambulatory Visit: Payer: Self-pay | Admitting: Psychiatry

## 2019-02-27 DIAGNOSIS — F5101 Primary insomnia: Secondary | ICD-10-CM

## 2019-03-01 ENCOUNTER — Telehealth: Payer: Self-pay | Admitting: Psychiatry

## 2019-03-01 ENCOUNTER — Other Ambulatory Visit: Payer: Self-pay | Admitting: Psychiatry

## 2019-03-01 DIAGNOSIS — F4310 Post-traumatic stress disorder, unspecified: Secondary | ICD-10-CM

## 2019-03-01 NOTE — Telephone Encounter (Signed)
ERROR

## 2019-03-01 NOTE — Telephone Encounter (Signed)
Due back now for 6 week f/u nothing scheduled

## 2019-03-24 ENCOUNTER — Other Ambulatory Visit: Payer: Self-pay | Admitting: Psychiatry

## 2019-03-24 DIAGNOSIS — F4310 Post-traumatic stress disorder, unspecified: Secondary | ICD-10-CM

## 2019-03-25 NOTE — Telephone Encounter (Signed)
90 day request has apt 02/18

## 2019-04-01 ENCOUNTER — Ambulatory Visit (INDEPENDENT_AMBULATORY_CARE_PROVIDER_SITE_OTHER): Payer: BC Managed Care – PPO | Admitting: Psychiatry

## 2019-04-01 ENCOUNTER — Encounter: Payer: Self-pay | Admitting: Psychiatry

## 2019-04-01 DIAGNOSIS — F900 Attention-deficit hyperactivity disorder, predominantly inattentive type: Secondary | ICD-10-CM | POA: Diagnosis not present

## 2019-04-01 DIAGNOSIS — F4310 Post-traumatic stress disorder, unspecified: Secondary | ICD-10-CM

## 2019-04-01 DIAGNOSIS — F5101 Primary insomnia: Secondary | ICD-10-CM | POA: Diagnosis not present

## 2019-04-01 DIAGNOSIS — F3342 Major depressive disorder, recurrent, in full remission: Secondary | ICD-10-CM | POA: Diagnosis not present

## 2019-04-01 MED ORDER — AMPHETAMINE-DEXTROAMPHETAMINE 15 MG PO TABS: 15.0000 mg | ORAL_TABLET | Freq: Two times a day (BID) | ORAL | 0 refills | Status: DC

## 2019-04-01 MED ORDER — SERTRALINE HCL 100 MG PO TABS: 150.0000 mg | ORAL_TABLET | Freq: Every day | ORAL | 0 refills | Status: DC

## 2019-04-01 MED ORDER — ZOLPIDEM TARTRATE 10 MG PO TABS: 10.0000 mg | ORAL_TABLET | Freq: Every evening | ORAL | 5 refills | Status: DC | PRN

## 2019-04-01 MED ORDER — ARIPIPRAZOLE 5 MG PO TABS: ORAL_TABLET | ORAL | 1 refills | Status: DC

## 2019-04-01 NOTE — Progress Notes (Signed)
Wendy Maynard [id number] [birth year] 44 y.o.  Virtual Visit via Telephone Note  I connected with pt on 04/01/19 at  4:30 PM EST by telephone and verified that I am speaking with the correct person using two identifiers.   I discussed the limitations, risks, security and privacy concerns of performing an evaluation and management service by telephone and the availability of in person appointments. I also discussed with the patient that there may be a patient responsible charge related to this service. The patient expressed understanding and agreed to proceed.   I discussed the assessment and treatment plan with the patient. The patient was provided an opportunity to ask questions and all were answered. The patient agreed with the plan and demonstrated an understanding of the instructions.   The patient was advised to call back or seek an in-person evaluation if the symptoms worsen or if the condition fails to improve as anticipated.  I provided 20 minutes of non-face-to-face time during this encounter.  The patient was located at home.  The provider was located at [facility].   Corie Chiquito, PMHNP   Subjective:   Patient ID:  Wendy Maynard is a 44 y.o. (DOB 09/28/1975) female.  Chief Complaint:  Chief Complaint  Patient presents with  . Follow-up    ADD, Anxiety, Depression    HPI Wendy Maynard presents for follow-up of depression, anxiety, insomnia, and ADD. She reports that her mood has been stable and stress has been less. Reports that she did not take Propranolol due to concern about insomnia as a possible side effect. She reports that she has been using mindfulness to manage stressors. Denies any recent panic s/s. Reports that she is experiencing some grief with father's birthday and death date approaching. Sleeping well with Ambien. Appetite has been about the same and reports that weight has been stable. Energy and motivation have been ok. She reports adequate concentration  with Adderall. Denies SI.   Applied to [employer] for [employer]. Has a new labradoodle puppy. Has been helping with [employer]. Enjoys reading and movies.   Review of Systems:  Review of Systems  Cardiovascular: Negative for palpitations.  Musculoskeletal: Negative for gait problem.  Neurological: Negative for tremors.  Psychiatric/Behavioral:       Please refer to HPI  Had back spasms that have since resolved.   Medications: I have reviewed the patient's current medications.  Current Outpatient Medications  Medication Sig Dispense Refill  . amphetamine-dextroamphetamine (ADDERALL) 15 MG tablet Take 1 tablet by mouth 2 (two) times daily. 60 tablet 0  . ARIPiprazole (ABILIFY) 5 MG tablet Take 1/2-1 po qd 90 tablet 1  . Ascorbic Acid (VITAMIN C) 100 MG tablet Take 100 mg by mouth daily.    . diclofenac (VOLTAREN) 75 MG EC tablet 2 (two) times daily as needed.     Marland Kitchen estradiol (ESTRACE) 1 MG tablet Take 1 tablet (1 mg total) by mouth daily. 30 tablet 0  . fexofenadine (ALLEGRA) 60 MG tablet Take 60 mg by mouth 2 (two) times daily as needed.     . Melatonin 10 MG CAPS Take by mouth.    . metFORMIN (GLUCOPHAGE-XR) 500 MG 24 hr tablet TAKE 1 TABLET BY MOUTH TWICE A DAY AS TOLERATED 180 tablet 1  . nicotine polacrilex (NICORETTE) 2 MG gum Take 1 each (2 mg total) by mouth as needed for smoking cessation. 100 tablet 0  . sertraline (ZOLOFT) 100 MG tablet Take 1.5 tablets (150 mg total) by mouth daily.  135 tablet 0  . zolpidem (AMBIEN) 10 MG tablet Take 1 tablet (10 mg total) by mouth at bedtime as needed. for sleep 30 tablet 5  . [START ON 05/27/2019] amphetamine-dextroamphetamine (ADDERALL) 15 MG tablet Take 1 tablet by mouth 2 (two) times daily. 60 tablet 0  . [START ON 04/29/2019] amphetamine-dextroamphetamine (ADDERALL) 15 MG tablet Take 1 tablet by mouth 2 (two) times daily. 60 tablet 0   No current facility-administered medications for this visit.    Medication  Side Effects: None  Allergies:  Allergies  Allergen Reactions  . Ciprofloxacin Hives and Other (See Comments)    Started in arm as soon as drug started.  . Clindamycin/Lincomycin Diarrhea and Other (See Comments)    Causes excessive diarrhea  . Vicodin [Hydrocodone-Acetaminophen] Itching and Nausea And Vomiting  . Penicillins Rash and Other (See Comments)    Occurred at 44 years old Has patient had a PCN reaction causing immediate rash, facial/tongue/throat swelling, SOB or lightheadedness with hypotension: yes Has patient had a PCN reaction causing severe rash involving mucus membranes or skin necrosis: no Has patient had a PCN reaction that required hospitalization no Has patient had a PCN reaction occurring within the last 10 years:no If all of the above answers are "NO", then may proceed with Cephalosporin use.   . Sulfa Antibiotics Rash    Past Medical History:  Diagnosis Date  . Abdominal pain in female patient 11/2012  . Anxiety   . Depression   . Diabetes mellitus without complication (HCC)   . Migraines   . Polycystic ovarian disease     Family History  Problem Relation Age of Onset  . Depression Mother        in response to grief/loss of husband  . Hypertension Father        Died sudenly at age 52. Reports father may have had Aspergers  . Depression Sister   . Anxiety disorder Sister   . ADD / ADHD Sister   . ADD / ADHD Cousin   . Asperger's syndrome Cousin     Social History   Socioeconomic History  . Marital status: Single    Spouse name: Not on file  . Number of children: Not on file  . Years of education: Not on file  . Highest education level: Not on file  Occupational History  . Not on file  Tobacco Use  . Smoking status: Current Every Day Smoker    Packs/day: 0.50    Years: 8.00    Pack years: 4.00    Types: Cigarettes  . Smokeless tobacco: Never Used  . Tobacco comment: plans to start nicotine gum  Substance and Sexual Activity  .  Alcohol use: No    Comment: Described as episodic  . Drug use: No    Frequency: 7.0 times per week    Types: Marijuana    Comment: Last use 04/27/16  . Sexual activity: Not Currently    Birth control/protection: Condom  Other Topics Concern  . Not on file  Social History Narrative  . Not on file   Social Determinants of Health   Financial Resource Strain:   . Difficulty of Paying Living Expenses: Not on file  Food Insecurity:   . Worried About Programme researcher, broadcasting/film/video in the Last Year: Not on file  . Ran Out of Food in the Last Year: Not on file  Transportation Needs:   . Lack of Transportation (Medical): Not on file  . Lack of Transportation (Non-Medical): Not  on file  Physical Activity:   . Days of Exercise per Week: Not on file  . Minutes of Exercise per Session: Not on file  Stress:   . Feeling of Stress : Not on file  Social Connections:   . Frequency of Communication with Friends and Family: Not on file  . Frequency of Social Gatherings with Friends and Family: Not on file  . Attends Religious Services: Not on file  . Active Member of Clubs or Organizations: Not on file  . Attends Banker Meetings: Not on file  . Marital Status: Not on file  Intimate Partner Violence:   . Fear of Current or Ex-Partner: Not on file  . Emotionally Abused: Not on file  . Physically Abused: Not on file  . Sexually Abused: Not on file    Past Medical History, Surgical history, Social history, and Family history were reviewed and updated as appropriate.   Please see review of systems for further details on the patient's review from today.   Objective:   Physical Exam:  There were no vitals taken for this visit.  Physical Exam Neurological:     Mental Status: She is alert and oriented to person, place, and time.     Cranial Nerves: No dysarthria.  Psychiatric:        Attention and Perception: Attention and perception normal.        Mood and Affect: Mood normal.         Speech: Speech normal.        Behavior: Behavior is cooperative.        Thought Content: Thought content normal. Thought content is not paranoid or delusional. Thought content does not include homicidal or suicidal ideation. Thought content does not include homicidal or suicidal plan.        Cognition and Memory: Cognition and memory normal.        Judgment: Judgment normal.     Comments: Insight intact     Lab Review:     Component Value Date/Time   NA 141 04/28/2016 1836   K 3.7 04/28/2016 1836   CL 107 04/28/2016 1836   CO2 25 04/28/2016 1836   GLUCOSE 90 04/28/2016 1836   BUN 14 04/28/2016 1836   CREATININE 0.68 04/28/2016 1836   CALCIUM 9.5 04/28/2016 1836   PROT 8.3 (H) 04/28/2016 1836   ALBUMIN 4.6 04/28/2016 1836   AST 21 04/28/2016 1836   ALT 18 04/28/2016 1836   ALKPHOS 67 04/28/2016 1836   BILITOT 0.5 04/28/2016 1836   GFRNONAA >60 04/28/2016 1836   GFRAA >60 04/28/2016 1836       Component Value Date/Time   WBC 9.4 04/28/2016 1836   RBC 4.61 04/28/2016 1836   HGB 12.5 04/28/2016 1836   HCT 37.7 04/28/2016 1836   PLT 272 04/28/2016 1836   MCV 81.8 04/28/2016 1836   MCV 89.0 02/10/2013 1721   MCH 27.1 04/28/2016 1836   MCHC 33.2 04/28/2016 1836   RDW 13.8 04/28/2016 1836   LYMPHSABS 2.1 02/26/2013 0154   MONOABS 0.6 02/26/2013 0154   EOSABS 0.2 02/26/2013 0154   BASOSABS 0.0 02/26/2013 0154    No results found for: POCLITH, LITHIUM   No results found for: PHENYTOIN, PHENOBARB, VALPROATE, CBMZ   .res Assessment: Plan:   Will continue current plan of care since target signs and symptoms are well controlled without any tolerability issues. Pt reports that pharmacy filled refill for Sertraline 100 mg instead of new script for 150 mg  qd, and she has continued to take Sertraline 100 mg qd and at this time mood and anxiety have been well controlled but that she may wish to increase dose to 150 mg qd if anxiety increases in response to psychosocial stressors.  Will therefore continue script for Sertraline 150 mg po qd.  Pt to f/u in 3 months or sooner if clinically indicated.  Patient advised to contact office with any questions, adverse effects, or acute worsening in signs and symptoms.  Almer was seen today for follow-up.  Diagnoses and all orders for this visit:  Recurrent major depressive disorder, in full remission ([place]) -     sertraline (ZOLOFT) 100 MG tablet; Take 1.5 tablets (150 mg total) by mouth daily. -     ARIPiprazole (ABILIFY) 5 MG tablet; Take 1/2-1 po qd  Posttraumatic stress disorder -     sertraline (ZOLOFT) 100 MG tablet; Take 1.5 tablets (150 mg total) by mouth daily.  Primary insomnia -     zolpidem (AMBIEN) 10 MG tablet; Take 1 tablet (10 mg total) by mouth at bedtime as needed. for sleep  Attention deficit hyperactivity disorder (ADHD), predominantly inattentive type -     amphetamine-dextroamphetamine (ADDERALL) 15 MG tablet; Take 1 tablet by mouth 2 (two) times daily. -     amphetamine-dextroamphetamine (ADDERALL) 15 MG tablet; Take 1 tablet by mouth 2 (two) times daily.    Please see After Visit Summary for patient specific instructions.  No future appointments.  No orders of the defined types were placed in this encounter.     -------------------------------

## 2019-04-14 ENCOUNTER — Other Ambulatory Visit: Payer: Self-pay | Admitting: Psychiatry

## 2019-04-14 DIAGNOSIS — F4310 Post-traumatic stress disorder, unspecified: Secondary | ICD-10-CM

## 2019-04-14 DIAGNOSIS — F3342 Major depressive disorder, recurrent, in full remission: Secondary | ICD-10-CM

## 2019-07-24 ENCOUNTER — Other Ambulatory Visit: Payer: Self-pay | Admitting: Psychiatry

## 2019-07-24 DIAGNOSIS — F4310 Post-traumatic stress disorder, unspecified: Secondary | ICD-10-CM

## 2019-07-24 DIAGNOSIS — F3342 Major depressive disorder, recurrent, in full remission: Secondary | ICD-10-CM

## 2019-07-26 ENCOUNTER — Telehealth: Payer: Self-pay | Admitting: Psychiatry

## 2019-07-26 ENCOUNTER — Other Ambulatory Visit: Payer: Self-pay

## 2019-07-26 DIAGNOSIS — F900 Attention-deficit hyperactivity disorder, predominantly inattentive type: Secondary | ICD-10-CM

## 2019-07-26 MED ORDER — AMPHETAMINE-DEXTROAMPHETAMINE 15 MG PO TABS: 15.0000 mg | ORAL_TABLET | Freq: Two times a day (BID) | ORAL | 0 refills | Status: DC

## 2019-07-26 NOTE — Telephone Encounter (Signed)
Last refill 06/24/2019 Pended for Shanda Bumps to review and submit

## 2019-07-26 NOTE — Telephone Encounter (Signed)
Pt needs a RF Adderall to [facility] in [place], [place]. Appt made 6/21

## 2019-08-02 ENCOUNTER — Telehealth: Payer: Self-pay | Admitting: Psychiatry

## 2019-08-02 ENCOUNTER — Encounter: Payer: Self-pay | Admitting: Psychiatry

## 2019-08-02 ENCOUNTER — Telehealth (INDEPENDENT_AMBULATORY_CARE_PROVIDER_SITE_OTHER): Payer: BC Managed Care – PPO | Admitting: Psychiatry

## 2019-08-02 DIAGNOSIS — F900 Attention-deficit hyperactivity disorder, predominantly inattentive type: Secondary | ICD-10-CM | POA: Diagnosis not present

## 2019-08-02 DIAGNOSIS — F5101 Primary insomnia: Secondary | ICD-10-CM

## 2019-08-02 DIAGNOSIS — F4310 Post-traumatic stress disorder, unspecified: Secondary | ICD-10-CM | POA: Diagnosis not present

## 2019-08-02 DIAGNOSIS — F3342 Major depressive disorder, recurrent, in full remission: Secondary | ICD-10-CM

## 2019-08-02 MED ORDER — ZOLPIDEM TARTRATE 10 MG PO TABS: 10.0000 mg | ORAL_TABLET | Freq: Every evening | ORAL | 5 refills | Status: DC | PRN

## 2019-08-02 MED ORDER — AMPHETAMINE-DEXTROAMPHETAMINE 15 MG PO TABS: 15.0000 mg | ORAL_TABLET | Freq: Two times a day (BID) | ORAL | 0 refills | Status: DC

## 2019-08-02 MED ORDER — SERTRALINE HCL 100 MG PO TABS: 100.0000 mg | ORAL_TABLET | Freq: Every day | ORAL | 1 refills | Status: DC

## 2019-08-02 NOTE — Progress Notes (Signed)
Wendy Maynard [id number] 07-28-1975 44 y.o.  Virtual Visit via Video Note  I connected with pt @ on 08/02/19 at 12:30 PM EDT by a video enabled telemedicine application and verified that I am speaking with the correct person using two identifiers.   I discussed the limitations of evaluation and management by telemedicine and the availability of in person appointments. The patient expressed understanding and agreed to proceed.  I discussed the assessment and treatment plan with the patient. The patient was provided an opportunity to ask questions and all were answered. The patient agreed with the plan and demonstrated an understanding of the instructions.   The patient was advised to call back or seek an in-person evaluation if the symptoms worsen or if the condition fails to improve as anticipated.  I provided 30 minutes of non-face-to-face time during this encounter.  The patient was located at home.  The provider was located at [facility].   Corie Chiquito, PMHNP   Subjective:   Patient ID:  Wendy Maynard is a 44 y.o. (DOB 1975/02/13) female.  Chief Complaint:  Chief Complaint  Patient presents with  . Follow-up    h/o ADD, depression, anxiety, and insomnia    HPI [name] presents for follow-up of depression, anxiety, insomnia, and ADD. "I've actually been feeling pretty good... I haven't been anxious at all." She asks about coming off Abilify this summer. Denies depressed mood. Rare sadness in response to grief. Sleeping well with Ambien. Typically takes 1/2 tab of Ambien most nights and taking an additional 1/2 tab if she has trouble sleeping. She reports that she has been having a regular sleep schedule. Goes to bed around 10-11 pm. Energy and motivation have been good. She reports that some days she is taking Adderall only once a day over the summer. She reports taking Adderall BID on days when she has different tasks that need to be accomplished. Concentration has been  adequate. Appetite has been ok. She reports that she has intentionally lost 10 lbs with [employer] over the last 3 weeks. Denies anhedonia. Denies SI.   Has been on summer break and visited the beach recently. Has been offered a one year contract for next year. Had to re-home their puppy. Mother has had some health issues and is feeling better currently. Reports that she has been thinking about financial planning for the future.   Smoking less and is averaging about 5 cigarettes a day.    Review of Systems:  Review of Systems  Gastrointestinal:       Occ heartburn  Musculoskeletal: Negative for gait problem.       Knee pain  Neurological: Negative for tremors.  Psychiatric/Behavioral:       Please refer to HPI   Has used CBD oil on a few occasions to help with pain and insomnia.  Medications: I have reviewed the patient's current medications.  Current Outpatient Medications  Medication Sig Dispense Refill  . [START ON 10/24/2019] amphetamine-dextroamphetamine (ADDERALL) 15 MG tablet Take 1 tablet by mouth 2 (two) times daily. 60 tablet 0  . Ascorbic Acid (VITAMIN C) 100 MG tablet Take 100 mg by mouth daily.    Marland Kitchen estradiol (ESTRACE) 1 MG tablet Take 1 tablet (1 mg total) by mouth daily. 30 tablet 0  . metFORMIN (GLUCOPHAGE-XR) 500 MG 24 hr tablet TAKE 1 TABLET BY MOUTH TWICE A DAY AS TOLERATED 180 tablet 1  . sertraline (ZOLOFT) 100 MG tablet Take 1 tablet (100 mg total) by mouth daily.  90 tablet 1  . [START ON 08/30/2019] zolpidem (AMBIEN) 10 MG tablet Take 1 tablet (10 mg total) by mouth at bedtime as needed. for sleep 30 tablet 5  . [START ON 09/26/2019] amphetamine-dextroamphetamine (ADDERALL) 15 MG tablet Take 1 tablet by mouth 2 (two) times daily. 60 tablet 0  . [START ON 08/29/2019] amphetamine-dextroamphetamine (ADDERALL) 15 MG tablet Take 1 tablet by mouth 2 (two) times daily. 60 tablet 0  . diclofenac (VOLTAREN) 75 MG EC tablet 2 (two) times daily as needed.  (Patient not  taking: Reported on 08/02/2019)    . fexofenadine (ALLEGRA) 60 MG tablet Take 60 mg by mouth 2 (two) times daily as needed.  (Patient not taking: Reported on 08/02/2019)     No current facility-administered medications for this visit.    Medication Side Effects: None  Denies any abnormal movements AIMS=0  Allergies:  Allergies  Allergen Reactions  . Ciprofloxacin Hives and Other (See Comments)    Started in arm as soon as drug started.  . Clindamycin/Lincomycin Diarrhea and Other (See Comments)    Causes excessive diarrhea  . Vicodin [Hydrocodone-Acetaminophen] Itching and Nausea And Vomiting  . Penicillins Rash and Other (See Comments)    Occurred at 44 years old Has patient had a PCN reaction causing immediate rash, facial/tongue/throat swelling, SOB or lightheadedness with hypotension: yes Has patient had a PCN reaction causing severe rash involving mucus membranes or skin necrosis: no Has patient had a PCN reaction that required hospitalization no Has patient had a PCN reaction occurring within the last 10 years:no If all of the above answers are "NO", then may proceed with Cephalosporin use.   . Sulfa Antibiotics Rash    Past Medical History:  Diagnosis Date  . Abdominal pain in female patient 11/2012  . Anxiety   . Depression   . Diabetes mellitus without complication ([place])   . Migraines   . Polycystic ovarian disease     Family History  Problem Relation Age of Onset  . Depression Mother        in response to grief/loss of husband  . Hypertension Father        Died sudenly at age 50. Reports father may have had Aspergers  . Depression Sister   . Anxiety disorder Sister   . ADD / ADHD Sister   . ADD / ADHD Cousin   . Asperger's syndrome Cousin     Social History   Socioeconomic History  . Marital status: Single    Spouse name: Not on file  . Number of children: Not on file  . Years of education: Not on file  . Highest education level: Not on file   Occupational History  . Not on file  Tobacco Use  . Smoking status: Current Every Day Smoker    Packs/day: 0.50    Years: 8.00    Pack years: 4.00    Types: Cigarettes  . Smokeless tobacco: Never Used  . Tobacco comment: plans to start nicotine gum  Substance and Sexual Activity  . Alcohol use: No    Comment: Described as episodic  . Drug use: No    Frequency: 7.0 times per week    Types: Marijuana    Comment: Last use 04/27/16  . Sexual activity: Not Currently    Birth control/protection: Condom  Other Topics Concern  . Not on file  Social History Narrative  . Not on file   Social Determinants of Health   Financial Resource Strain:   . Difficulty  of Paying Living Expenses:   Food Insecurity:   . Worried About Programme researcher, broadcasting/film/video in the Last Year:   . Barista in the Last Year:   Transportation Needs:   . Freight forwarder (Medical):   Marland Kitchen Lack of Transportation (Non-Medical):   Physical Activity:   . Days of Exercise per Week:   . Minutes of Exercise per Session:   Stress:   . Feeling of Stress :   Social Connections:   . Frequency of Communication with Friends and Family:   . Frequency of Social Gatherings with Friends and Family:   . Attends Religious Services:   . Active Member of Clubs or Organizations:   . Attends Banker Meetings:   Marland Kitchen Marital Status:   Intimate Partner Violence:   . Fear of Current or Ex-Partner:   . Emotionally Abused:   Marland Kitchen Physically Abused:   . Sexually Abused:     Past Medical History, Surgical history, Social history, and Family history were reviewed and updated as appropriate.   Please see review of systems for further details on the patient's review from today.   Objective:   Physical Exam:  Wt 250 lb (113.4 kg)   BMI 39.16 kg/m  She reports that her BP has been WNL   Physical Exam Neurological:     Mental Status: She is alert and oriented to person, place, and time.     Cranial Nerves: No  dysarthria.  Psychiatric:        Attention and Perception: Attention and perception normal.        Mood and Affect: Mood normal.        Speech: Speech normal.        Behavior: Behavior is cooperative.        Thought Content: Thought content normal. Thought content is not paranoid or delusional. Thought content does not include homicidal or suicidal ideation. Thought content does not include homicidal or suicidal plan.        Cognition and Memory: Cognition and memory normal.        Judgment: Judgment normal.     Comments: Insight intact     Lab Review:     Component Value Date/Time   NA 141 04/28/2016 1836   K 3.7 04/28/2016 1836   CL 107 04/28/2016 1836   CO2 25 04/28/2016 1836   GLUCOSE 90 04/28/2016 1836   BUN 14 04/28/2016 1836   CREATININE 0.68 04/28/2016 1836   CALCIUM 9.5 04/28/2016 1836   PROT 8.3 (H) 04/28/2016 1836   ALBUMIN 4.6 04/28/2016 1836   AST 21 04/28/2016 1836   ALT 18 04/28/2016 1836   ALKPHOS 67 04/28/2016 1836   BILITOT 0.5 04/28/2016 1836   GFRNONAA >60 04/28/2016 1836   GFRAA >60 04/28/2016 1836       Component Value Date/Time   WBC 9.4 04/28/2016 1836   RBC 4.61 04/28/2016 1836   HGB 12.5 04/28/2016 1836   HCT 37.7 04/28/2016 1836   PLT 272 04/28/2016 1836   MCV 81.8 04/28/2016 1836   MCV 89.0 02/10/2013 1721   MCH 27.1 04/28/2016 1836   MCHC 33.2 04/28/2016 1836   RDW 13.8 04/28/2016 1836   LYMPHSABS 2.1 02/26/2013 0154   MONOABS 0.6 02/26/2013 0154   EOSABS 0.2 02/26/2013 0154   BASOSABS 0.0 02/26/2013 0154    No results found for: POCLITH, LITHIUM   No results found for: PHENYTOIN, PHENOBARB, VALPROATE, CBMZ   .res Assessment: Plan:  Agree with plan to stop Abilify since patient has not had any recent depressive signs and symptoms and she is currently on summer break.  Discussed that Abilify could be restarted if she experiences recurrence of depression.  Discussed that sertraline could also be increased if needed. Continue  sertraline 100 mg daily for anxiety depression. Continue Adderall 15 mg twice daily for attention deficit disorder. Continue Ambien 10 mg at bedtime for insomnia. Patient to follow-up in 3 months or sooner if clinically indicated. Patient advised to contact office with any questions, adverse effects, or acute worsening in signs and symptoms.   Wendy Maynard was seen today for follow-up.  Diagnoses and all orders for this visit:  Recurrent major depressive disorder, in full remission (HCC) -     sertraline (ZOLOFT) 100 MG tablet; Take 1 tablet (100 mg total) by mouth daily.  Posttraumatic stress disorder -     sertraline (ZOLOFT) 100 MG tablet; Take 1 tablet (100 mg total) by mouth daily.  Attention deficit hyperactivity disorder (ADHD), predominantly inattentive type -     amphetamine-dextroamphetamine (ADDERALL) 15 MG tablet; Take 1 tablet by mouth 2 (two) times daily. -     amphetamine-dextroamphetamine (ADDERALL) 15 MG tablet; Take 1 tablet by mouth 2 (two) times daily. -     amphetamine-dextroamphetamine (ADDERALL) 15 MG tablet; Take 1 tablet by mouth 2 (two) times daily.  Primary insomnia -     zolpidem (AMBIEN) 10 MG tablet; Take 1 tablet (10 mg total) by mouth at bedtime as needed. for sleep     Please see After Visit Summary for patient specific instructions.  No future appointments.  No orders of the defined types were placed in this encounter.     -------------------------------

## 2019-08-02 NOTE — Telephone Encounter (Signed)
Ms. tavaria, mackins are scheduled for a virtual visit with your provider today.    Just as we do with appointments in the office, we must obtain your consent to participate.  Your consent will be active for this visit and any virtual visit you may have with one of our providers in the next 365 days.    If you have a MyChart account, I can also send a copy of this consent to you electronically.  All virtual visits are billed to your insurance company just like a traditional visit in the office.  As this is a virtual visit, video technology does not allow for your provider to perform a traditional examination.  This may limit your provider's ability to fully assess your condition.  If your provider identifies any concerns that need to be evaluated in person or the need to arrange testing such as labs, EKG, etc, we will make arrangements to do so.    Although advances in technology are sophisticated, we cannot ensure that it will always work on either your end or our end.  If the connection with a video visit is poor, we may have to switch to a telephone visit.  With either a video or telephone visit, we are not always able to ensure that we have a secure connection.   I need to obtain your verbal consent now.   Are you willing to proceed with your visit today?   Shealee Pica has provided verbal consent on 08/02/2019 for a virtual visit (video or telephone).   Corie Chiquito, PMHNP 08/02/2019  12:29 PM

## 2019-11-01 ENCOUNTER — Telehealth: Payer: Self-pay | Admitting: Psychiatry

## 2019-11-01 NOTE — Telephone Encounter (Signed)
Pt called to inform [facility] said her Adderal is expiring soon in 60 days as of 10-10-19. Please call [facility] on this. [phone]

## 2019-11-01 NOTE — Telephone Encounter (Signed)
Wendy Maynard, can you check with the patient. Patient does have current Rx's for August and Sept so unclear what they are referring to.

## 2019-11-02 NOTE — Telephone Encounter (Signed)
Left her a VM to return my call.

## 2019-11-16 ENCOUNTER — Telehealth: Payer: Self-pay | Admitting: Psychiatry

## 2019-11-16 ENCOUNTER — Encounter: Payer: Self-pay | Admitting: Psychiatry

## 2019-11-16 ENCOUNTER — Telehealth (INDEPENDENT_AMBULATORY_CARE_PROVIDER_SITE_OTHER): Payer: BC Managed Care – PPO | Admitting: Psychiatry

## 2019-11-16 VITALS — Wt 250.0 lb

## 2019-11-16 DIAGNOSIS — F4310 Post-traumatic stress disorder, unspecified: Secondary | ICD-10-CM

## 2019-11-16 DIAGNOSIS — F3342 Major depressive disorder, recurrent, in full remission: Secondary | ICD-10-CM

## 2019-11-16 DIAGNOSIS — F900 Attention-deficit hyperactivity disorder, predominantly inattentive type: Secondary | ICD-10-CM | POA: Diagnosis not present

## 2019-11-16 MED ORDER — AMPHETAMINE-DEXTROAMPHETAMINE 15 MG PO TABS: 15.0000 mg | ORAL_TABLET | Freq: Two times a day (BID) | ORAL | 0 refills | Status: DC

## 2019-11-16 NOTE — Progress Notes (Signed)
Wendy Maynard [id number] [birth year] 44 y.o.  Virtual Visit via Telephone Note  I connected with pt on 11/16/19 at  9:00 AM EDT by telephone and verified that I am speaking with the correct person using two identifiers.   I discussed the limitations, risks, security and privacy concerns of performing an evaluation and management service by telephone and the availability of in person appointments. I also discussed with the patient that there may be a patient responsible charge related to this service. The patient expressed understanding and agreed to proceed.   I discussed the assessment and treatment plan with the patient. The patient was provided an opportunity to ask questions and all were answered. The patient agreed with the plan and demonstrated an understanding of the instructions.   The patient was advised to call back or seek an in-person evaluation if the symptoms worsen or if the condition fails to improve as anticipated.  I provided 20 minutes of non-face-to-face time during this encounter.  The patient was located at home.  The provider was located at [facility].   Corie Chiquito, PMHNP   Subjective:   Patient ID:  Wendy Maynard is a 44 y.o. (DOB [birth year]) female.  Chief Complaint:  Chief Complaint  Patient presents with  . Follow-up    ADD, depression, anxiety, insomnia    HPI Wendy Maynard presents for follow-up of depression, anxiety, and ADD. She reports that her mother sold her house and they moved to a townhouse to rent for the year. She reports that this has caused some stress with coinciding with the start of the school year. She reports that she had some increased anxiety around the time of the move and had some chest pain and shortness of breath. She was seen in urgent care for these s/s and medical work-up was WNL and she now questions if this was a panic attack. Anxiety has improved with decrease in stressors. Mood has been ok. Denies any depression. Sleep  has been ok for the most part with occasional disrupted sleep. Appetite has been ok. She reports that she has been intentionally losing about 10 lbs since the summer. Motivation was high and has been lower since move and start of school year. Energy has also been somewhat lower. Concentration has been ok. Denies SI.   She reports that she seems to be doing ok off Abilify and has noticed some possible improvement in energy and possible decrease in appetite.  Has been thinking about her father recently after friend lost father around the same age as her father.   She took her mother to the beach last weekend for mother's 70th birthday. She reports that school year is going ok and has been busy. Enjoying new townhouse.   She reports that she has been smoking 5-7 cigarettes daily.   Review of Systems:  Review of Systems  Constitutional: Positive for diaphoresis.  Cardiovascular: Negative for chest pain and palpitations.  Endocrine: Positive for heat intolerance.  Musculoskeletal: Negative for gait problem.  Psychiatric/Behavioral:       Please refer to HPI  She reports that she had recent abnormal mammogram and had diagnostic mammogram and ultrasound which was normal.   Medications: I have reviewed the patient's current medications.  Current Outpatient Medications  Medication Sig Dispense Refill  . [START ON 01/31/2020] amphetamine-dextroamphetamine (ADDERALL) 15 MG tablet Take 1 tablet by mouth 2 (two) times daily. 60 tablet 0  . Ascorbic Acid (VITAMIN C) 100 MG tablet Take 100 mg by mouth daily.    Marland Kitchen  estradiol (ESTRACE) 1 MG tablet Take 1 tablet (1 mg total) by mouth daily. 30 tablet 0  . sertraline (ZOLOFT) 100 MG tablet Take 1 tablet (100 mg total) by mouth daily. 90 tablet 1  . zolpidem (AMBIEN) 10 MG tablet Take 1 tablet (10 mg total) by mouth at bedtime as needed. for sleep 30 tablet 5  . [START ON 01/03/2020] amphetamine-dextroamphetamine (ADDERALL) 15 MG tablet Take 1 tablet by  mouth 2 (two) times daily. 60 tablet 0  . [START ON 12/06/2019] amphetamine-dextroamphetamine (ADDERALL) 15 MG tablet Take 1 tablet by mouth 2 (two) times daily. 60 tablet 0  . diclofenac (VOLTAREN) 75 MG EC tablet 2 (two) times daily as needed.  (Patient not taking: Reported on 08/02/2019)    . fexofenadine (ALLEGRA) 60 MG tablet Take 60 mg by mouth 2 (two) times daily as needed.  (Patient not taking: Reported on 08/02/2019)     No current facility-administered medications for this visit.    Medication Side Effects: None  Allergies:  Allergies  Allergen Reactions  . Ciprofloxacin Hives and Other (See Comments)    Started in arm as soon as drug started.  . Clindamycin/Lincomycin Diarrhea and Other (See Comments)    Causes excessive diarrhea  . Vicodin [Hydrocodone-Acetaminophen] Itching and Nausea And Vomiting  . Penicillins Rash and Other (See Comments)    Occurred at 44 years old Has patient had a PCN reaction causing immediate rash, facial/tongue/throat swelling, SOB or lightheadedness with hypotension: yes Has patient had a PCN reaction causing severe rash involving mucus membranes or skin necrosis: no Has patient had a PCN reaction that required hospitalization no Has patient had a PCN reaction occurring within the last 10 years:no If all of the above answers are "NO", then may proceed with Cephalosporin use.   . Sulfa Antibiotics Rash    Past Medical History:  Diagnosis Date  . Abdominal pain in female patient 11/2012  . Anxiety   . Depression   . Diabetes mellitus without complication (HCC)   . Migraines   . Polycystic ovarian disease     Family History  Problem Relation Age of Onset  . Depression Mother        in response to grief/loss of husband  . Hypertension Father        Died sudenly at age 267. Reports father may have had Aspergers  . Depression Sister   . Anxiety disorder Sister   . ADD / ADHD Sister   . ADD / ADHD Cousin   . Asperger's syndrome Cousin      Social History   Socioeconomic History  . Marital status: Single    Spouse name: Not on file  . Number of children: Not on file  . Years of education: Not on file  . Highest education level: Not on file  Occupational History  . Not on file  Tobacco Use  . Smoking status: Current Every Day Smoker    Packs/day: 0.50    Years: 8.00    Pack years: 4.00    Types: Cigarettes  . Smokeless tobacco: Never Used  . Tobacco comment: plans to start nicotine gum  Substance and Sexual Activity  . Alcohol use: No    Comment: Described as episodic  . Drug use: No    Frequency: 7.0 times per week    Types: Marijuana    Comment: Last use 04/27/16  . Sexual activity: Not Currently    Birth control/protection: Condom  Other Topics Concern  . Not on file  Social History Narrative  . Not on file   Social Determinants of Health   Financial Resource Strain:   . Difficulty of Paying Living Expenses: Not on file  Food Insecurity:   . Worried About Programme researcher, broadcasting/film/video in the Last Year: Not on file  . Ran Out of Food in the Last Year: Not on file  Transportation Needs:   . Lack of Transportation (Medical): Not on file  . Lack of Transportation (Non-Medical): Not on file  Physical Activity:   . Days of Exercise per Week: Not on file  . Minutes of Exercise per Session: Not on file  Stress:   . Feeling of Stress : Not on file  Social Connections:   . Frequency of Communication with Friends and Family: Not on file  . Frequency of Social Gatherings with Friends and Family: Not on file  . Attends Religious Services: Not on file  . Active Member of Clubs or Organizations: Not on file  . Attends Banker Meetings: Not on file  . Marital Status: Not on file  Intimate Partner Violence:   . Fear of Current or Ex-Partner: Not on file  . Emotionally Abused: Not on file  . Physically Abused: Not on file  . Sexually Abused: Not on file    Past Medical History, Surgical history,  Social history, and Family history were reviewed and updated as appropriate.   Please see review of systems for further details on the patient's review from today.   Objective:   Physical Exam:  Wt 250 lb (113.4 kg)   BMI 39.16 kg/m   Physical Exam Neurological:     Mental Status: She is alert and oriented to person, place, and time.     Cranial Nerves: No dysarthria.  Psychiatric:        Attention and Perception: Attention and perception normal.        Mood and Affect: Mood normal.        Speech: Speech normal.        Behavior: Behavior is cooperative.        Thought Content: Thought content normal. Thought content is not paranoid or delusional. Thought content does not include homicidal or suicidal ideation. Thought content does not include homicidal or suicidal plan.        Cognition and Memory: Cognition and memory normal.        Judgment: Judgment normal.     Comments: Insight intact     Lab Review:     Component Value Date/Time   NA 141 04/28/2016 1836   K 3.7 04/28/2016 1836   CL 107 04/28/2016 1836   CO2 25 04/28/2016 1836   GLUCOSE 90 04/28/2016 1836   BUN 14 04/28/2016 1836   CREATININE 0.68 04/28/2016 1836   CALCIUM 9.5 04/28/2016 1836   PROT 8.3 (H) 04/28/2016 1836   ALBUMIN 4.6 04/28/2016 1836   AST 21 04/28/2016 1836   ALT 18 04/28/2016 1836   ALKPHOS 67 04/28/2016 1836   BILITOT 0.5 04/28/2016 1836   GFRNONAA >60 04/28/2016 1836   GFRAA >60 04/28/2016 1836       Component Value Date/Time   WBC 9.4 04/28/2016 1836   RBC 4.61 04/28/2016 1836   HGB 12.5 04/28/2016 1836   HCT 37.7 04/28/2016 1836   PLT 272 04/28/2016 1836   MCV 81.8 04/28/2016 1836   MCV 89.0 02/10/2013 1721   MCH 27.1 04/28/2016 1836   MCHC 33.2 04/28/2016 1836   RDW 13.8 04/28/2016 1836  LYMPHSABS 2.1 02/26/2013 0154   MONOABS 0.6 02/26/2013 0154   EOSABS 0.2 02/26/2013 0154   BASOSABS 0.0 02/26/2013 0154    No results found for: POCLITH, LITHIUM   No results found  for: PHENYTOIN, PHENOBARB, VALPROATE, CBMZ   .res Assessment: Plan:   Will continue current plan of care since target signs and symptoms are well controlled without any tolerability issues. Continue Sertraline 100 mg po qd for depression and anxiety. Continue Adderall 15 mg BID for ADD.  Continue Ambien for insomnia. Pt to follow-up in 3 months or sooner if clinically indicated. Patient advised to contact office with any questions, adverse effects, or acute worsening in signs and symptoms.  Japleen was seen today for follow-up.  Diagnoses and all orders for this visit:  Attention deficit hyperactivity disorder (ADHD), predominantly inattentive type -     amphetamine-dextroamphetamine (ADDERALL) 15 MG tablet; Take 1 tablet by mouth 2 (two) times daily. -     amphetamine-dextroamphetamine (ADDERALL) 15 MG tablet; Take 1 tablet by mouth 2 (two) times daily. -     amphetamine-dextroamphetamine (ADDERALL) 15 MG tablet; Take 1 tablet by mouth 2 (two) times daily.  Posttraumatic stress disorder  Recurrent major depressive disorder, in full remission [facility])    Please see After Visit Summary for patient specific instructions.  Future Appointments  Date Time Provider Department Center  02/15/2020  8:30 AM Corie Chiquito, PMHNP [facility] None    No orders of the defined types were placed in this encounter.     -------------------------------

## 2019-11-16 NOTE — Telephone Encounter (Signed)
Pt called upset. Stated she just received bad news about breast cancer. Has to have biopsy. Pt having a panic attack ask for Rx for Ativan. [facility] [address]

## 2019-11-16 NOTE — Telephone Encounter (Signed)
Looks like she had apt today so not sure if this was before or after?

## 2019-11-17 ENCOUNTER — Telehealth: Payer: Self-pay | Admitting: Psychiatry

## 2019-11-17 DIAGNOSIS — F4310 Post-traumatic stress disorder, unspecified: Secondary | ICD-10-CM

## 2019-11-17 MED ORDER — LORAZEPAM 0.5 MG PO TABS: ORAL_TABLET | ORAL | 0 refills | Status: DC

## 2019-11-17 NOTE — Addendum Note (Signed)
Addended by: Derenda Mis on: 11/17/2019 02:36 PM   Modules accepted: Orders

## 2019-11-17 NOTE — Telephone Encounter (Signed)
Please review

## 2019-11-17 NOTE — Telephone Encounter (Signed)
Patient was seen yesterday by Shanda Bumps. She is requesting an RX for Ativan as she had a cancer scare and needs something to help her through the stress. [facility] [address] [facility] [place], [place]. I didn't see where it was called in but might be missing it.

## 2019-11-18 MED ORDER — ALPRAZOLAM 0.5 MG PO TABS: 0.5000 mg | ORAL_TABLET | Freq: Three times a day (TID) | ORAL | 0 refills | Status: DC | PRN

## 2019-11-18 NOTE — Addendum Note (Signed)
Addended by: Derenda Mis on: 11/18/2019 08:31 AM   Modules accepted: Orders

## 2020-01-17 ENCOUNTER — Telehealth: Payer: Self-pay

## 2020-01-17 NOTE — Telephone Encounter (Signed)
Prior authorization submitted and approved for AMPHETAMINE-DEXTROAMPHETAMINE 15 MG with [facility] effective 01/14/2020-01/14/2023

## 2020-01-31 ENCOUNTER — Other Ambulatory Visit: Payer: Self-pay | Admitting: Psychiatry

## 2020-01-31 DIAGNOSIS — F5101 Primary insomnia: Secondary | ICD-10-CM

## 2020-01-31 NOTE — Telephone Encounter (Signed)
Pt called to report Ambien needs PA and she is leaving to go out of country tomorrow. Willing to pay out of pocket. Pharmacy advised Rx has expired. [facility] on file. [address] Pt leaving early am tomorrow.

## 2020-02-01 ENCOUNTER — Other Ambulatory Visit: Payer: Self-pay | Admitting: Psychiatry

## 2020-02-01 DIAGNOSIS — F3342 Major depressive disorder, recurrent, in full remission: Secondary | ICD-10-CM

## 2020-02-01 DIAGNOSIS — F4310 Post-traumatic stress disorder, unspecified: Secondary | ICD-10-CM

## 2020-02-15 ENCOUNTER — Telehealth (INDEPENDENT_AMBULATORY_CARE_PROVIDER_SITE_OTHER): Payer: BC Managed Care – PPO | Admitting: Psychiatry

## 2020-02-15 ENCOUNTER — Encounter: Payer: Self-pay | Admitting: Psychiatry

## 2020-02-15 DIAGNOSIS — F4310 Post-traumatic stress disorder, unspecified: Secondary | ICD-10-CM

## 2020-02-15 DIAGNOSIS — F3342 Major depressive disorder, recurrent, in full remission: Secondary | ICD-10-CM | POA: Diagnosis not present

## 2020-02-15 DIAGNOSIS — F900 Attention-deficit hyperactivity disorder, predominantly inattentive type: Secondary | ICD-10-CM | POA: Diagnosis not present

## 2020-02-15 MED ORDER — AMPHETAMINE-DEXTROAMPHETAMINE 15 MG PO TABS: 15.0000 mg | ORAL_TABLET | Freq: Two times a day (BID) | ORAL | 0 refills | Status: DC

## 2020-02-15 MED ORDER — ALPRAZOLAM 0.5 MG PO TABS
0.5000 mg | ORAL_TABLET | Freq: Three times a day (TID) | ORAL | 0 refills | Status: DC | PRN
Start: 2020-02-15 — End: 2020-04-03

## 2020-02-15 MED ORDER — SERTRALINE HCL 100 MG PO TABS: 150.0000 mg | ORAL_TABLET | Freq: Every day | ORAL | 1 refills | Status: DC

## 2020-02-15 NOTE — Progress Notes (Signed)
Wendy Maynard [id number] Dec 06, 1975 45 y.o.  Virtual Visit via Telephone Note  I connected with pt on 02/15/20 at  8:30 AM EST by telephone and verified that I am speaking with the correct person using two identifiers.   I discussed the limitations, risks, security and privacy concerns of performing an evaluation and management service by telephone and the availability of in person appointments. I also discussed with the patient that there may be a patient responsible charge related to this service. The patient expressed understanding and agreed to proceed.   I discussed the assessment and treatment plan with the patient. The patient was provided an opportunity to ask questions and all were answered. The patient agreed with the plan and demonstrated an understanding of the instructions.   The patient was advised to call back or seek an in-person evaluation if the symptoms worsen or if the condition fails to improve as anticipated.  I provided 30 minutes of non-face-to-face time during this encounter.  The patient was located at home.  The provider was located at [facility].   Corie Chiquito, PMHNP   Subjective:   Patient ID:  Wendy Maynard is a 45 y.o. (DOB [birth year]) female.  Chief Complaint:  Chief Complaint  Patient presents with  . Follow-up    H/o depression, anxiety, and ADHD    HPI Wendy Maynard presents for follow-up of anxiety, depression, ADHD, and insomnia. 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 She reports that her anxiety has been "ok." She reports that she had increased anxiety with trying to return to [place] after cruise and other people were frustrated with them. She has noticed that she is more impatient and irritable at times with her mother. Reports that she is not acting out with frustration and remains patient in professional settings. Experienced some  "let down" yesterday after returning from cruise. Mood has been stable. Sleeping ok. She reports that her mother has said she snores. Energy and motivation have been ok. She reports feeling "refreshed" after vacation. Concentration has been ok. Able to enjoy trip. Appetite has been good. Denies SI.  Just returned from a Viking cruise in the [place] with her mother. Mother has had some back and knee issues. 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   Grandfather died around the time of her breast cancer scare. She reports that she had a diagnostic mammogram, ultrasound, and a biopsy was determined to be ok and she will follow-up in 6 months. She reports that she took Alprazolam before procedure and this was very helpful for anxiety during the biopsy.   Plans to to start swimming.   Review of Systems:  Review of Systems  Constitutional: Positive for diaphoresis.       Reports that she has been "overheating frequently" and that PCP tested TSH and estrogen, which were normal.  Cardiovascular: Negative for palpitations.  Musculoskeletal: Negative for gait problem.  Neurological: Negative for tremors.  Psychiatric/Behavioral:       Please refer to HPI    Medications: I have reviewed the patient's current medications.  Current Outpatient Medications  Medication Sig Dispense Refill  . Ascorbic Acid (VITAMIN C) 100 MG tablet Take 100 mg by mouth daily.    Marland Kitchen estradiol (ESTRACE) 1 MG tablet Take 1 tablet (1 mg total) by mouth daily. 30 tablet 0  . glycopyrrolate (ROBINUL) 1 MG tablet Take 1 mg by mouth 2 (two)  times daily.    Marland Kitchen zolpidem (AMBIEN) 10 MG tablet TAKE 1 TABLET BY MOUTH AT BEDTIME AS NEEDED FOR SLEEP 30 tablet 5  . ALPRAZolam (XANAX) 0.5 MG tablet Take 1 tablet (0.5 mg total) by mouth 3 (three) times daily as needed for anxiety. 30 tablet 0  . [START ON 04/11/2020] amphetamine-dextroamphetamine (ADDERALL) 15 MG tablet Take 1 tablet by mouth 2 (two) times daily. 60 tablet 0  . [START ON  03/14/2020] amphetamine-dextroamphetamine (ADDERALL) 15 MG tablet Take 1 tablet by mouth 2 (two) times daily. 60 tablet 0  . amphetamine-dextroamphetamine (ADDERALL) 15 MG tablet Take 1 tablet by mouth 2 (two) times daily. 60 tablet 0  . diclofenac (VOLTAREN) 75 MG EC tablet 2 (two) times daily as needed.  (Patient not taking: Reported on 08/02/2019)    . fexofenadine (ALLEGRA) 60 MG tablet Take 60 mg by mouth 2 (two) times daily as needed.  (Patient not taking: Reported on 08/02/2019)    . sertraline (ZOLOFT) 100 MG tablet Take 1.5 tablets (150 mg total) by mouth daily. 135 tablet 1   No current facility-administered medications for this visit.    Medication Side Effects: None  Allergies:  Allergies  Allergen Reactions  . Ciprofloxacin Hives and Other (See Comments)    Started in arm as soon as drug started.  . Clindamycin/Lincomycin Diarrhea and Other (See Comments)    Causes excessive diarrhea  . Vicodin [Hydrocodone-Acetaminophen] Itching and Nausea And Vomiting  . Penicillins Rash and Other (See Comments)    Occurred at 45 years old Has patient had a PCN reaction causing immediate rash, facial/tongue/throat swelling, SOB or lightheadedness with hypotension: yes Has patient had a PCN reaction causing severe rash involving mucus membranes or skin necrosis: no Has patient had a PCN reaction that required hospitalization no Has patient had a PCN reaction occurring within the last 10 years:no If all of the above answers are "NO", then may proceed with Cephalosporin use.   . Sulfa Antibiotics Rash    Past Medical History:  Diagnosis Date  . Abdominal pain in female patient 11/2012  . Anxiety   . Depression   . Diabetes mellitus without complication (HCC)   . Migraines   . Polycystic ovarian disease     Family History  Problem Relation Age of Onset  . Depression Mother        in response to grief/loss of husband  . Hypertension Father        Died sudenly at age 48. Reports  father may have had Aspergers  . Depression Sister   . Anxiety disorder Sister   . ADD / ADHD Sister   . ADD / ADHD Cousin   . Asperger's syndrome Cousin     Social History   Socioeconomic History  . Marital status: Single    Spouse name: Not on file  . Number of children: Not on file  . Years of education: Not on file  . Highest education level: Not on file  Occupational History  . Not on file  Tobacco Use  . Smoking status: Current Every Day Smoker    Packs/day: 0.50    Years: 8.00    Pack years: 4.00    Types: Cigarettes  . Smokeless tobacco: Never Used  . Tobacco comment: plans to start nicotine gum  Substance and Sexual Activity  . Alcohol use: No    Comment: Described as episodic  . Drug use: No    Frequency: 7.0 times per week    Types:  Marijuana    Comment: Last use 04/27/16  . Sexual activity: Not Currently    Birth control/protection: Condom  Other Topics Concern  . Not on file  Social History Narrative  . Not on file   Social Determinants of Health   Financial Resource Strain: Not on file  Food Insecurity: Not on file  Transportation Needs: Not on file  Physical Activity: Not on file  Stress: Not on file  Social Connections: Not on file  Intimate Partner Violence: Not on file    Past Medical History, Surgical history, Social history, and Family history were reviewed and updated as appropriate.   Please see review of systems for further details on the patient's review from today.   Objective:   Physical Exam:  There were no vitals taken for this visit.  Physical Exam Neurological:     Mental Status: She is alert and oriented to person, place, and time.     Cranial Nerves: No dysarthria.  Psychiatric:        Attention and Perception: Attention and perception normal.        Mood and Affect: Mood normal.        Speech: Speech normal.        Behavior: Behavior is cooperative.        Thought Content: Thought content normal. Thought content is  not paranoid or delusional. Thought content does not include homicidal or suicidal ideation. Thought content does not include homicidal or suicidal plan.        Cognition and Memory: Cognition and memory normal.        Judgment: Judgment normal.     Comments: Insight intact     Lab Review:     Component Value Date/Time   NA 141 04/28/2016 1836   K 3.7 04/28/2016 1836   CL 107 04/28/2016 1836   CO2 25 04/28/2016 1836   GLUCOSE 90 04/28/2016 1836   BUN 14 04/28/2016 1836   CREATININE 0.68 04/28/2016 1836   CALCIUM 9.5 04/28/2016 1836   PROT 8.3 (H) 04/28/2016 1836   ALBUMIN 4.6 04/28/2016 1836   AST 21 04/28/2016 1836   ALT 18 04/28/2016 1836   ALKPHOS 67 04/28/2016 1836   BILITOT 0.5 04/28/2016 1836   GFRNONAA >60 04/28/2016 1836   GFRAA >60 04/28/2016 1836       Component Value Date/Time   WBC 9.4 04/28/2016 1836   RBC 4.61 04/28/2016 1836   HGB 12.5 04/28/2016 1836   HCT 37.7 04/28/2016 1836   PLT 272 04/28/2016 1836   MCV 81.8 04/28/2016 1836   MCV 89.0 02/10/2013 1721   MCH 27.1 04/28/2016 1836   MCHC 33.2 04/28/2016 1836   RDW 13.8 04/28/2016 1836   LYMPHSABS 2.1 02/26/2013 0154   MONOABS 0.6 02/26/2013 0154   EOSABS 0.2 02/26/2013 0154   BASOSABS 0.0 02/26/2013 0154    No results found for: POCLITH, LITHIUM   No results found for: PHENYTOIN, PHENOBARB, VALPROATE, CBMZ   .res Assessment: Plan:    Recommend sleep study due to excessive daytime somnolence and not feeling rested upon awakening.  Will continue current plan of care since target signs and symptoms are well controlled without any tolerability issues. Pt to follow-up in 3 months or sooner if clinically indicated.  Patient advised to contact office with any questions, adverse effects, or acute worsening in signs and symptoms.   Wendy Maynard was seen today for follow-up.  Diagnoses and all orders for this visit:  Attention deficit hyperactivity disorder (ADHD), predominantly  inattentive type -      amphetamine-dextroamphetamine (ADDERALL) 15 MG tablet; Take 1 tablet by mouth 2 (two) times daily. -     amphetamine-dextroamphetamine (ADDERALL) 15 MG tablet; Take 1 tablet by mouth 2 (two) times daily. -     amphetamine-dextroamphetamine (ADDERALL) 15 MG tablet; Take 1 tablet by mouth 2 (two) times daily.  Recurrent major depressive disorder, in full remission ([place]) -     sertraline (ZOLOFT) 100 MG tablet; Take 1.5 tablets (150 mg total) by mouth daily.  Posttraumatic stress disorder -     sertraline (ZOLOFT) 100 MG tablet; Take 1.5 tablets (150 mg total) by mouth daily. -     ALPRAZolam (XANAX) 0.5 MG tablet; Take 1 tablet (0.5 mg total) by mouth 3 (three) times daily as needed for anxiety.    Please see After Visit Summary for patient specific instructions.  No future appointments.  No orders of the defined types were placed in this encounter.     -------------------------------

## 2020-04-03 ENCOUNTER — Other Ambulatory Visit: Payer: Self-pay | Admitting: Psychiatry

## 2020-04-03 ENCOUNTER — Telehealth: Payer: Self-pay | Admitting: Psychiatry

## 2020-04-03 DIAGNOSIS — F4310 Post-traumatic stress disorder, unspecified: Secondary | ICD-10-CM

## 2020-04-03 NOTE — Telephone Encounter (Signed)
Pt would like a refill on Xanax 0.5mg . [facility] [place] [place].

## 2020-04-18 MED ORDER — ALPRAZOLAM 0.5 MG PO TABS
0.5000 mg | ORAL_TABLET | Freq: Three times a day (TID) | ORAL | 0 refills | Status: DC | PRN
Start: 2020-04-18 — End: 2020-05-12
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
## 2020-04-19 ENCOUNTER — Telehealth: Payer: Self-pay | Admitting: Psychiatry

## 2020-04-19 ENCOUNTER — Telehealth (INDEPENDENT_AMBULATORY_CARE_PROVIDER_SITE_OTHER): Payer: BC Managed Care – PPO | Admitting: Psychiatry

## 2020-04-19 DIAGNOSIS — F4310 Post-traumatic stress disorder, unspecified: Secondary | ICD-10-CM

## 2020-04-19 DIAGNOSIS — F3342 Major depressive disorder, recurrent, in full remission: Secondary | ICD-10-CM

## 2020-04-19 DIAGNOSIS — F332 Major depressive disorder, recurrent severe without psychotic features: Secondary | ICD-10-CM

## 2020-04-19 DIAGNOSIS — F5101 Primary insomnia: Secondary | ICD-10-CM

## 2020-04-19 DIAGNOSIS — F900 Attention-deficit hyperactivity disorder, predominantly inattentive type: Secondary | ICD-10-CM

## 2020-04-19 MED ORDER — ARIPIPRAZOLE 5 MG PO TABS: ORAL_TABLET | ORAL | 1 refills | Status: DC

## 2020-04-19 NOTE — Progress Notes (Signed)
[name] [id number] [birth year] 45 y.o.  Virtual Visit via Video Note  I connected with pt @ on 04/19/20 at  8:30 AM EST by a video enabled telemedicine application and verified that I am speaking with the correct person using two identifiers.   I discussed the limitations of evaluation and management by telemedicine and the availability of in person appointments. The patient expressed understanding and agreed to proceed.  I discussed the assessment and treatment plan with the patient. The patient was provided an opportunity to ask questions and all were answered. The patient agreed with the plan and demonstrated an understanding of the instructions.   The patient was advised to call back or seek an in-person evaluation if the symptoms worsen or if the condition fails to improve as anticipated.  I provided 30 minutes of non-face-to-face time during this encounter.  The patient was located at home.  The provider was located at [facility].   Corie Chiquito, PMHNP   Subjective:   Patient ID:  Wendy Maynard is a 45 y.o. (DOB 1975/08/29) female.  Chief Complaint:  Chief Complaint  Patient presents with  . Anxiety  . Depression  . Insomnia    HPI Wendy Maynard presents for follow-up of anxiety, depression, ADHD, and insomnia. Yesterday was the anniversary of her father's death date. She reports that she made a list of all the things she needs to do for work and it took up multiple pages. 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 She is also supposed to process new equipment received from a grant. She is responsible for fixing equipment throughout the school. Her mother just had COVID and her dog has glaucoma. She  reports that she is contemplating a career change./  Yesterday she learned that she is expected to purchase $55,000 of books and was told that this project would normally take a year full-time. She reports that she and her mother had an argument after mother criticized her for doing work at home.  She reports that when she returned from the beach her Ambien and Xanax were gone. She is not sure if they fell out of her bed or if someone stole them since all of her other medications were still there. She reports that she had limited sleep over the last couple of nights without Ambien.   She reports that she has been experiencing irritability in response to work demands and this is leading to depression. "I'm starting to get really depressed and anxious again." She reports high levels of anxiety. She reports certain interactions can trigger intrusive memories. She reports feeling overwhelmed with making decisions about things, such as where she should live, etc. Reports that she has had periods of tearfulness. She reports that she has had some intentional weight loss and is using Noom to try to eat healthier. Lower energy and motivation. She reports poor concentration She reports that she has had some fleeting SI and is fearful "that if I keep going that route, I am going to end up there."   She reports that she has been trying to increase self-care, by going to the beach for the weekend and going swimming. Has not been able to socialize. Has been trying to find a Veterinary surgeon.   Review of Systems:  Review of Systems  Cardiovascular: Negative for palpitations.  Musculoskeletal: Negative for gait problem.  Neurological: Negative for tremors.  Psychiatric/Behavioral:       Please refer to HPI    Medications: I have reviewed the patient's current medications.  Current Outpatient Medications  Medication Sig Dispense Refill  . sertraline (ZOLOFT) 100 MG tablet Take 1.5 tablets (150 mg total) by mouth  daily. 135 tablet 1  . ALPRAZolam (XANAX) 0.5 MG tablet Take 1 tablet (0.5 mg total) by mouth 3 (three) times daily as needed for anxiety. 30 tablet 0  . amphetamine-dextroamphetamine (ADDERALL) 15 MG tablet Take 1 tablet by mouth 2 (two) times daily. 60 tablet 0  . amphetamine-dextroamphetamine (ADDERALL) 15 MG tablet Take 1 tablet by mouth 2 (two) times daily. 60 tablet 0  . amphetamine-dextroamphetamine (ADDERALL) 15 MG tablet Take 1 tablet by mouth 2 (two) times daily. 60 tablet 0  . ARIPiprazole (ABILIFY) 5 MG tablet Take 1/2-1 po qd 90 tablet 1  . Ascorbic Acid (VITAMIN C) 100 MG tablet Take 100 mg by mouth daily.    . diclofenac (VOLTAREN) 75 MG EC tablet 2 (two) times daily as needed.  (Patient not taking: Reported on 08/02/2019)    . estradiol (ESTRACE) 1 MG tablet Take 1 tablet (1 mg total) by mouth daily. 30 tablet 0  . fexofenadine (ALLEGRA) 60 MG tablet Take 60 mg by mouth 2 (two) times daily as needed.  (Patient not taking: Reported on 08/02/2019)    . glycopyrrolate (ROBINUL) 1 MG tablet Take 1 mg by mouth 2 (two) times daily.    Marland Kitchen. zolpidem (AMBIEN) 10 MG tablet TAKE 1 TABLET BY MOUTH AT BEDTIME AS NEEDED FOR SLEEP 30 tablet 5   No current facility-administered medications for this visit.    Medication Side Effects: None  Allergies:  Allergies  Allergen Reactions  . Ciprofloxacin Hives and Other (See Comments)    Started in arm as soon as drug started.  . Clindamycin/Lincomycin Diarrhea and Other (See Comments)    Causes excessive diarrhea  . Vicodin [Hydrocodone-Acetaminophen] Itching and Nausea And Vomiting  . Penicillins Rash and Other (See Comments)    Occurred at 45 years old Has patient had a PCN reaction causing immediate rash, facial/tongue/throat swelling, SOB or lightheadedness with hypotension: yes Has patient had a PCN reaction causing severe rash involving mucus membranes or skin necrosis: no Has patient had a PCN reaction that required hospitalization no Has  patient had a PCN reaction occurring within the last 10 years:no If all of the above answers are "NO", then may proceed with Cephalosporin use.   . Sulfa Antibiotics Rash    Past Medical History:  Diagnosis Date  . Abdominal pain in female patient 11/2012  . Anxiety   . Depression   . Diabetes mellitus without complication (HCC)   . Migraines   . Polycystic ovarian disease     Family History  Problem Relation Age of Onset  . Depression Mother        in response to grief/loss of husband  . Hypertension Father        Died sudenly at age 44. Reports father may have had Aspergers  . Depression Sister   . Anxiety disorder Sister   . ADD / ADHD Sister   . ADD / ADHD Cousin   . Asperger's syndrome Cousin     Social History   Socioeconomic History  . Marital status: Single    Spouse name: Not on file  . Number of  children: Not on file  . Years of education: Not on file  . Highest education level: Not on file  Occupational History  . Not on file  Tobacco Use  . Smoking status: Current Every Day Smoker    Packs/day: 0.50    Years: 8.00    Pack years: 4.00    Types: Cigarettes  . Smokeless tobacco: Never Used  . Tobacco comment: plans to start nicotine gum  Substance and Sexual Activity  . Alcohol use: No    Comment: Described as episodic  . Drug use: No    Frequency: 7.0 times per week    Types: Marijuana    Comment: Last use 04/27/16  . Sexual activity: Not Currently    Birth control/protection: Condom  Other Topics Concern  . Not on file  Social History Narrative  . Not on file   Social Determinants of Health   Financial Resource Strain: Not on file  Food Insecurity: Not on file  Transportation Needs: Not on file  Physical Activity: Not on file  Stress: Not on file  Social Connections: Not on file  Intimate Partner Violence: Not on file    Past Medical History, Surgical history, Social history, and Family history were reviewed and updated as appropriate.    Please see review of systems for further details on the patient's review from today.   Objective:   Physical Exam:  There were no vitals taken for this visit.  Physical Exam Neurological:     Mental Status: She is alert and oriented to person, place, and time.     Cranial Nerves: No dysarthria.  Psychiatric:        Attention and Perception: Attention and perception normal.        Mood and Affect: Mood is anxious and depressed.        Speech: Speech normal.        Behavior: Behavior is agitated.        Thought Content: Thought content normal. Thought content is not paranoid or delusional. Thought content does not include homicidal or suicidal ideation. Thought content does not include homicidal or suicidal plan.        Cognition and Memory: Cognition and memory normal.        Judgment: Judgment normal.     Comments: Insight intact Pt more talkative compared to baseline. Perseverates on current stressors Hyper-focus on some details and difficulty focusing on other details     Lab Review:     Component Value Date/Time   NA 141 04/28/2016 1836   K 3.7 04/28/2016 1836   CL 107 04/28/2016 1836   CO2 25 04/28/2016 1836   GLUCOSE 90 04/28/2016 1836   BUN 14 04/28/2016 1836   CREATININE 0.68 04/28/2016 1836   CALCIUM 9.5 04/28/2016 1836   PROT 8.3 (H) 04/28/2016 1836   ALBUMIN 4.6 04/28/2016 1836   AST 21 04/28/2016 1836   ALT 18 04/28/2016 1836   ALKPHOS 67 04/28/2016 1836   BILITOT 0.5 04/28/2016 1836   GFRNONAA >60 04/28/2016 1836   GFRAA >60 04/28/2016 1836       Component Value Date/Time   WBC 9.4 04/28/2016 1836   RBC 4.61 04/28/2016 1836   HGB 12.5 04/28/2016 1836   HCT 37.7 04/28/2016 1836   PLT 272 04/28/2016 1836   MCV 81.8 04/28/2016 1836   MCV 89.0 02/10/2013 1721   MCH 27.1 04/28/2016 1836   MCHC 33.2 04/28/2016 1836   RDW 13.8 04/28/2016 1836   LYMPHSABS 2.1  02/26/2013 0154   MONOABS 0.6 02/26/2013 0154   EOSABS 0.2 02/26/2013 0154   BASOSABS 0.0  02/26/2013 0154    No results found for: POCLITH, LITHIUM   No results found for: PHENYTOIN, PHENOBARB, VALPROATE, CBMZ   .res Assessment: Plan:   45 minutes spent counseling pt regarding treatment options for worsening anxiety, depression, and irritation. Time also spent discussing leave of absence from work, process of requesting leave, and preparing documentation to support leave of absence. Discussed option to re-start Abilify since this was helpful in the past when initiated during hospitalization when she experienced similar anxiety and depressive s/s. Pt has taken Abilify 5 mg, 2.5 mg, and 2 mg in the past and prefers to re-start 5 mg dose to potentially stabilize mood and anxiety s/s as quickly as possible.  Will re-start Abilify 5 mg po qd for depression. Abilify has also been helpful for her anxiety in the past.  Continue Sertraline 150 mg po qd for mood and anxiety.  Continue Xanax prn anxiety.  Continue Ambien 10 mg po QHS prn insomnia.  Continue Adderall 15 mg po BID for ADHD.   Recommend leave of absence since Lynda's anxiety and depressive s/s are currently impacting her ability to function at work and are causing significant distress. Recommend leave of absence from 04/20/20 until 05/18/20.   Remi was seen today for anxiety, depression and insomnia.  Diagnoses and all orders for this visit:  Severe episode of recurrent major depressive disorder, without psychotic features (HCC) -     ARIPiprazole (ABILIFY) 5 MG tablet; Take 1/2-1 po qd  Posttraumatic stress disorder  Attention deficit hyperactivity disorder (ADHD), predominantly inattentive type  Primary insomnia     Please see After Visit Summary for patient specific instructions.  Future Appointments  Date Time Provider Department Center  05/12/2020  2:00 PM Corie Chiquito, PMHNP [facility] None    No orders of the defined types were placed in this encounter.     -------------------------------

## 2020-04-19 NOTE — Telephone Encounter (Signed)
Letter revised

## 2020-04-19 NOTE — Telephone Encounter (Signed)
Wendy Maynard called, she got the letter off My Chart but she needed something edited on it. It should read Recommended leave of absence from 04/20/20 until 05/18/20. The rest of the letter is fine. If you can send it back to her once completed. She said thank you.

## 2020-04-21 ENCOUNTER — Encounter: Payer: Self-pay | Admitting: Psychiatry

## 2020-04-28 ENCOUNTER — Telehealth: Payer: Self-pay | Admitting: Psychiatry

## 2020-04-28 NOTE — Telephone Encounter (Signed)
Wendy Maynard called because she has decided she is not going to do the FMLA so you don't need to complete the forms.  She will keep her appt 05/12/20.

## 2020-04-28 NOTE — Telephone Encounter (Signed)
Reviewed thanks! 

## 2020-05-01 NOTE — Telephone Encounter (Signed)
FYI

## 2020-05-12 ENCOUNTER — Encounter: Payer: Self-pay | Admitting: Psychiatry

## 2020-05-12 ENCOUNTER — Telehealth (INDEPENDENT_AMBULATORY_CARE_PROVIDER_SITE_OTHER): Payer: BC Managed Care – PPO | Admitting: Psychiatry

## 2020-05-12 DIAGNOSIS — F5101 Primary insomnia: Secondary | ICD-10-CM

## 2020-05-12 DIAGNOSIS — F330 Major depressive disorder, recurrent, mild: Secondary | ICD-10-CM | POA: Diagnosis not present

## 2020-05-12 DIAGNOSIS — F900 Attention-deficit hyperactivity disorder, predominantly inattentive type: Secondary | ICD-10-CM | POA: Diagnosis not present

## 2020-05-12 DIAGNOSIS — F4310 Post-traumatic stress disorder, unspecified: Secondary | ICD-10-CM | POA: Diagnosis not present

## 2020-05-12 MED ORDER — AMPHETAMINE-DEXTROAMPHETAMINE 15 MG PO TABS: 15.0000 mg | ORAL_TABLET | Freq: Two times a day (BID) | ORAL | 0 refills | Status: DC

## 2020-05-12 MED ORDER — AMPHETAMINE-DEXTROAMPHETAMINE 15 MG PO TABS
15.0000 mg | ORAL_TABLET | Freq: Two times a day (BID) | ORAL | 0 refills | Status: DC
Start: 2020-06-20 — End: 2020-07-03

## 2020-05-12 MED ORDER — ALPRAZOLAM 0.5 MG PO TABS: 0.5000 mg | ORAL_TABLET | Freq: Three times a day (TID) | ORAL | 2 refills | Status: DC | PRN

## 2020-05-12 NOTE — Progress Notes (Signed)
Wendy Maynard [id number] 02/17/1975 45 y.o.  Virtual Visit via Video Note  I connected with pt @ on 05/12/20 at  2:00 PM EDT by a video enabled telemedicine application and verified that I am speaking with the correct person using two identifiers.   I discussed the limitations of evaluation and management by telemedicine and the availability of in person appointments. The patient expressed understanding and agreed to proceed.  I discussed the assessment and treatment plan with the patient. The patient was provided an opportunity to ask questions and all were answered. The patient agreed with the plan and demonstrated an understanding of the instructions.   The patient was advised to call back or seek an in-person evaluation if the symptoms worsen or if the condition fails to improve as anticipated.  I provided 30 minutes of non-face-to-face time during this encounter.  The patient was located at home.  The provider was located at [facility].   Corie Chiquito, PMHNP   Subjective:   Patient ID:  Wendy Maynard is a 45 y.o. (DOB 05/05/1975) female.  Chief Complaint:  Chief Complaint  Patient presents with  . Anxiety  . Depression    HPI Wendy Maynard presents for follow-up of anxiety, depression, ADHD, and insomnia. 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 She reports that she has been applying for other jobs, such as working in [employer]. She reports that she feels relief and a "sense of peace" after making decision to change jobs. She reports that she feels anxious and the people close to her have noticed she is anxious and is talking more/faster. She has been using Xanax prn about 5 days out of 7. She reports that she is slowing down when possible, such as being more present in situations and not rushing to get to work.   She is taking  Ambien 1/2 tablet at bedtime most nights and will take the additional 1/2 tablet if she cannot fall asleep. She reports that her goal is to wean off Ambien. She reports that she has had long-standing sleep paralysis and this was worse when she took Zofran during recent GI illness. She reports that energy and motivation have improved. She reports that her concentration has improved and is able to sustain focus for a longer period of time. She reports that she had some SI 2 weeks ago and started Abilify at that time. Denies current SI.  She reports that she has maintained weight since re-starting Abilify. She is sleeping about 6 hours a night now. She is cutting off caffeine after 12 noon and before was consuming caffeine until 2 pm. 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   She has been journaling and coloring frequently.   She reports that she has been working on Sundays to try to complete her work.   Taking Abilify 5 mg po qd.   Review of Systems:  Review of Systems  Gastrointestinal:       Recent GI illness.   Musculoskeletal: Negative for gait problem.  Neurological: Negative for tremors.  Psychiatric/Behavioral:       Please refer to HPI    Medications: I have reviewed the patient's current medications.  Current Outpatient Medications  Medication Sig Dispense Refill  . Ascorbic Acid (VITAMIN C) 100 MG tablet Take 100  mg by mouth daily.    Marland Kitchen. estradiol (ESTRACE) 1 MG tablet Take 1 tablet (1 mg total) by mouth daily. 30 tablet 0  . fexofenadine (ALLEGRA) 60 MG tablet Take 60 mg by mouth 2 (two) times daily as needed.    Marland Kitchen. glycopyrrolate (ROBINUL) 1 MG tablet Take 1 mg by mouth 2 (two) times daily.    . sertraline (ZOLOFT) 100 MG tablet Take 1.5 tablets (150 mg total) by mouth daily. 135 tablet 1  . zolpidem (AMBIEN) 10 MG tablet TAKE 1 TABLET BY MOUTH AT BEDTIME AS NEEDED FOR SLEEP 30 tablet 5  . [START ON 05/16/2020]  ALPRAZolam (XANAX) 0.5 MG tablet Take 1 tablet (0.5 mg total) by mouth 3 (three) times daily as needed for anxiety. 30 tablet 2  . amphetamine-dextroamphetamine (ADDERALL) 15 MG tablet Take 1 tablet by mouth 2 (two) times daily. 60 tablet 0  . [START ON 05/23/2020] amphetamine-dextroamphetamine (ADDERALL) 15 MG tablet Take 1 tablet by mouth 2 (two) times daily. 60 tablet 0  . [START ON 06/20/2020] amphetamine-dextroamphetamine (ADDERALL) 15 MG tablet Take 1 tablet by mouth 2 (two) times daily. 60 tablet 0  . ARIPiprazole (ABILIFY) 5 MG tablet Take 1/2-1 po qd 90 tablet 1  . diclofenac (VOLTAREN) 75 MG EC tablet 2 (two) times daily as needed.  (Patient not taking: Reported on 08/02/2019)     No current facility-administered medications for this visit.    Medication Side Effects: None  Allergies:  Allergies  Allergen Reactions  . Ciprofloxacin Hives and Other (See Comments)    Started in arm as soon as drug started.  . Clindamycin/Lincomycin Diarrhea and Other (See Comments)    Causes excessive diarrhea  . Vicodin [Hydrocodone-Acetaminophen] Itching and Nausea And Vomiting  . Penicillins Rash and Other (See Comments)    Occurred at 45 years old Has patient had a PCN reaction causing immediate rash, facial/tongue/throat swelling, SOB or lightheadedness with hypotension: yes Has patient had a PCN reaction causing severe rash involving mucus membranes or skin necrosis: no Has patient had a PCN reaction that required hospitalization no Has patient had a PCN reaction occurring within the last 10 years:no If all of the above answers are "NO", then may proceed with Cephalosporin use.   . Sulfa Antibiotics Rash    Past Medical History:  Diagnosis Date  . Abdominal pain in female patient 11/2012  . Anxiety   . Depression   . Diabetes mellitus without complication (HCC)   . Migraines   . Polycystic ovarian disease     Family History  Problem Relation Age of Onset  . Depression Mother         in response to grief/loss of husband  . Hypertension Father        Died sudenly at age 44. Reports father may have had Aspergers  . Depression Sister   . Anxiety disorder Sister   . ADD / ADHD Sister   . ADD / ADHD Cousin   . Asperger's syndrome Cousin     Social History   Socioeconomic History  . Marital status: Single    Spouse name: Not on file  . Number of children: Not on file  . Years of education: Not on file  . Highest education level: Not on file  Occupational History  . Not on file  Tobacco Use  . Smoking status: Current Every Day Smoker    Packs/day: 0.50    Years: 8.00    Pack years: 4.00    Types: Cigarettes  .  Smokeless tobacco: Never Used  . Tobacco comment: plans to start nicotine gum  Substance and Sexual Activity  . Alcohol use: No    Comment: Described as episodic  . Drug use: No    Frequency: 7.0 times per week    Types: Marijuana    Comment: Last use 04/27/16  . Sexual activity: Not Currently    Birth control/protection: Condom  Other Topics Concern  . Not on file  Social History Narrative  . Not on file   Social Determinants of Health   Financial Resource Strain: Not on file  Food Insecurity: Not on file  Transportation Needs: Not on file  Physical Activity: Not on file  Stress: Not on file  Social Connections: Not on file  Intimate Partner Violence: Not on file    Past Medical History, Surgical history, Social history, and Family history were reviewed and updated as appropriate.   Please see review of systems for further details on the patient's review from today.   Objective:   Physical Exam:  There were no vitals taken for this visit.  Physical Exam Neurological:     Mental Status: She is alert and oriented to person, place, and time.     Cranial Nerves: No dysarthria.  Psychiatric:        Attention and Perception: Attention and perception normal.        Mood and Affect: Mood is anxious.        Speech: Speech normal.         Behavior: Behavior is cooperative.        Thought Content: Thought content normal. Thought content is not paranoid or delusional. Thought content does not include homicidal or suicidal ideation. Thought content does not include homicidal or suicidal plan.        Cognition and Memory: Cognition and memory normal.        Judgment: Judgment normal.     Comments: Insight intact Mood presents as less anxious and less depressed compared to last visit.  Forward thinking      Lab Review:     Component Value Date/Time   NA 141 04/28/2016 1836   K 3.7 04/28/2016 1836   CL 107 04/28/2016 1836   CO2 25 04/28/2016 1836   GLUCOSE 90 04/28/2016 1836   BUN 14 04/28/2016 1836   CREATININE 0.68 04/28/2016 1836   CALCIUM 9.5 04/28/2016 1836   PROT 8.3 (H) 04/28/2016 1836   ALBUMIN 4.6 04/28/2016 1836   AST 21 04/28/2016 1836   ALT 18 04/28/2016 1836   ALKPHOS 67 04/28/2016 1836   BILITOT 0.5 04/28/2016 1836   GFRNONAA >60 04/28/2016 1836   GFRAA >60 04/28/2016 1836       Component Value Date/Time   WBC 9.4 04/28/2016 1836   RBC 4.61 04/28/2016 1836   HGB 12.5 04/28/2016 1836   HCT 37.7 04/28/2016 1836   PLT 272 04/28/2016 1836   MCV 81.8 04/28/2016 1836   MCV 89.0 02/10/2013 1721   MCH 27.1 04/28/2016 1836   MCHC 33.2 04/28/2016 1836   RDW 13.8 04/28/2016 1836   LYMPHSABS 2.1 02/26/2013 0154   MONOABS 0.6 02/26/2013 0154   EOSABS 0.2 02/26/2013 0154   BASOSABS 0.0 02/26/2013 0154    No results found for: POCLITH, LITHIUM   No results found for: PHENYTOIN, PHENOBARB, VALPROATE, CBMZ   .res Assessment: Plan:   Discussed continuing current plan at this time until she can complete school year. Discussed using Xanax prn for situational anxiety at work  with plan to then decrease use at end of the school year. She reports that she would prefer to reduce Abilify in the future since it may have caused wt gain in the past, however she reports "I need it right now." Will continue  Abilify 5 mg at this time and consider dose reduction in the future.  Will continue Ambien 10 mg 1/2-1 tab po QHS prn insomnia.  Continue Adderall 15 mg po BID for ADHD.  Continue Sertraline 150 mg daily for anxiety and depression.  Discussed resuming therapy once patient determines her plans in terms of possibly relocating. Discussed remote therapy options and possible referral to Stevphen Meuse, [facility] if she returns to the [place] area. Pt to follow-up in 4-6 weeks or sooner if clinically indicated.  Patient advised to contact office with any questions, adverse effects, or acute worsening in signs and symptoms.  Wendy Maynard was seen today for anxiety and depression.  Diagnoses and all orders for this visit:  Mild episode of recurrent major depressive disorder (HCC)  Posttraumatic stress disorder -     ALPRAZolam (XANAX) 0.5 MG tablet; Take 1 tablet (0.5 mg total) by mouth 3 (three) times daily as needed for anxiety.  Attention deficit hyperactivity disorder (ADHD), predominantly inattentive type -     amphetamine-dextroamphetamine (ADDERALL) 15 MG tablet; Take 1 tablet by mouth 2 (two) times daily. -     amphetamine-dextroamphetamine (ADDERALL) 15 MG tablet; Take 1 tablet by mouth 2 (two) times daily.  Primary insomnia     Please see After Visit Summary for patient specific instructions.  No future appointments.  No orders of the defined types were placed in this encounter.     -------------------------------

## 2020-06-20 ENCOUNTER — Other Ambulatory Visit: Payer: Self-pay | Admitting: Psychiatry

## 2020-06-20 DIAGNOSIS — F4310 Post-traumatic stress disorder, unspecified: Secondary | ICD-10-CM

## 2020-06-22 NOTE — Telephone Encounter (Signed)
Last filled 06/05/20

## 2020-06-27 ENCOUNTER — Telehealth: Payer: BC Managed Care – PPO | Admitting: Psychiatry

## 2020-07-03 ENCOUNTER — Telehealth (INDEPENDENT_AMBULATORY_CARE_PROVIDER_SITE_OTHER): Payer: BC Managed Care – PPO | Admitting: Psychiatry

## 2020-07-03 ENCOUNTER — Encounter: Payer: Self-pay | Admitting: Psychiatry

## 2020-07-03 DIAGNOSIS — F900 Attention-deficit hyperactivity disorder, predominantly inattentive type: Secondary | ICD-10-CM | POA: Diagnosis not present

## 2020-07-03 DIAGNOSIS — F4310 Post-traumatic stress disorder, unspecified: Secondary | ICD-10-CM

## 2020-07-03 DIAGNOSIS — F5101 Primary insomnia: Secondary | ICD-10-CM | POA: Diagnosis not present

## 2020-07-03 DIAGNOSIS — F3342 Major depressive disorder, recurrent, in full remission: Secondary | ICD-10-CM

## 2020-07-03 MED ORDER — ZOLPIDEM TARTRATE 10 MG PO TABS: 10.0000 mg | ORAL_TABLET | Freq: Every evening | ORAL | 2 refills | Status: DC | PRN

## 2020-07-03 MED ORDER — AMPHETAMINE-DEXTROAMPHETAMINE 15 MG PO TABS: 15.0000 mg | ORAL_TABLET | Freq: Two times a day (BID) | ORAL | 0 refills | Status: DC

## 2020-07-03 MED ORDER — AMPHETAMINE-DEXTROAMPHETAMINE 15 MG PO TABS
15.0000 mg | ORAL_TABLET | Freq: Two times a day (BID) | ORAL | 0 refills | Status: DC
Start: 2020-08-31 — End: 2020-08-03

## 2020-07-03 NOTE — Progress Notes (Signed)
Wendy Maynard [id number] 1975-04-22 45 y.o.  Virtual Visit via Video Note  I connected with pt @ on 07/03/20 at 10:00 AM EDT by a video enabled telemedicine application and verified that I am speaking with the correct person using two identifiers.   I discussed the limitations of evaluation and management by telemedicine and the availability of in person appointments. The patient expressed understanding and agreed to proceed.  I discussed the assessment and treatment plan with the patient. The patient was provided an opportunity to ask questions and all were answered. The patient agreed with the plan and demonstrated an understanding of the instructions.   The patient was advised to call back or seek an in-person evaluation if the symptoms worsen or if the condition fails to improve as anticipated.  I provided 30 minutes of non-face-to-face time during this encounter.  The patient was located at home.  The provider was located at [facility].   Corie Chiquito, PMHNP   Subjective:   Patient ID:  Wendy Maynard is a 45 y.o. (DOB 1976-01-30) female.  Chief Complaint:  Chief Complaint  Patient presents with  . Anxiety  . Depression    HPI Wendy Maynard presents for follow-up of depression, anxiety, ADHD, and insomnia. She reports continued job stress and working 60-80 hours a week. Has had several job interviews. Her grandmother died after grandfather died in the fall. Continues to grieve the loss of father.   She has been taking Abilify 5 mg one full tablet due to multiple stressors. Has been taking Xanax daily during this time. She reports that Abilify helps her not have excessive worry and prevents SI. She reports "I'm still worried, but I can function." She reports that she would like to lose weight and start exercising and preparing meals. She would also like to re-start counseling and have time for self-care. She reports adequate sleep and is not needing to take Ambien every night.  She has eliminated caffeine mid-day.Energy has been ok. Motivation is improving. Appetite has been ok and slightly increased. Concentration has been good. Denies SI or HI.   She reports that [employer] went well and was rewarding.   Sister and mother have not spoken in 2 years. Has cousins and sister in the [place] area. She has been aqua-jogging some. Has been doing some self-care.   Review of Systems:  Review of Systems  Cardiovascular: Negative for palpitations.  Gastrointestinal:       Occasional heart burn  Musculoskeletal: Positive for back pain. Negative for gait problem.       Knee pain  Neurological: Negative for tremors.  Psychiatric/Behavioral:       Please refer to HPI    Medications: I have reviewed the patient's current medications.  Current Outpatient Medications  Medication Sig Dispense Refill  . ALPRAZolam (XANAX) 0.5 MG tablet TAKE 1 TABLET (0.5 MG TOTAL) BY MOUTH 3 (THREE) TIMES DAILY AS NEEDED FOR ANXIETY. 30 tablet 2  . ARIPiprazole (ABILIFY) 5 MG tablet Take 1/2-1 po qd 90 tablet 1  . Ascorbic Acid (VITAMIN C) 100 MG tablet Take 100 mg by mouth daily.    Marland Kitchen estradiol (ESTRACE) 1 MG tablet Take 1 tablet (1 mg total) by mouth daily. 30 tablet 0  . glycopyrrolate (ROBINUL) 1 MG tablet Take 1 mg by mouth 2 (two) times daily.    . sertraline (ZOLOFT) 100 MG tablet Take 1.5 tablets (150 mg total) by mouth daily. (Patient taking differently: Take 100 mg by mouth daily.)  135 tablet 1  . [START ON 08/31/2020] amphetamine-dextroamphetamine (ADDERALL) 15 MG tablet Take 1 tablet by mouth 2 (two) times daily. 60 tablet 0  . [START ON 08/03/2020] amphetamine-dextroamphetamine (ADDERALL) 15 MG tablet Take 1 tablet by mouth 2 (two) times daily. 60 tablet 0  . [START ON 07/06/2020] amphetamine-dextroamphetamine (ADDERALL) 15 MG tablet Take 1 tablet by mouth 2 (two) times daily. 60 tablet 0  . cyclobenzaprine (FLEXERIL) 10 MG tablet Take 1 tablet by mouth 3 (three) times  daily as needed.    . diclofenac (VOLTAREN) 75 MG EC tablet 2 (two) times daily as needed.  (Patient not taking: No sig reported)    . fexofenadine (ALLEGRA) 60 MG tablet Take 60 mg by mouth 2 (two) times daily as needed.    Melene Muller ON 07/10/2020] zolpidem (AMBIEN) 10 MG tablet Take 1 tablet (10 mg total) by mouth at bedtime as needed. for sleep 30 tablet 2   No current facility-administered medications for this visit.    Medication Side Effects: Other: Some slight increase in appetite  Allergies:  Allergies  Allergen Reactions  . Ciprofloxacin Hives and Other (See Comments)    Started in arm as soon as drug started.  . Clindamycin/Lincomycin Diarrhea and Other (See Comments)    Causes excessive diarrhea  . Vicodin [Hydrocodone-Acetaminophen] Itching and Nausea And Vomiting  . Penicillins Rash and Other (See Comments)    Occurred at 45 years old Has patient had a PCN reaction causing immediate rash, facial/tongue/throat swelling, SOB or lightheadedness with hypotension: yes Has patient had a PCN reaction causing severe rash involving mucus membranes or skin necrosis: no Has patient had a PCN reaction that required hospitalization no Has patient had a PCN reaction occurring within the last 10 years:no If all of the above answers are "NO", then may proceed with Cephalosporin use.   . Sulfa Antibiotics Rash    Past Medical History:  Diagnosis Date  . Abdominal pain in female patient 11/2012  . Anxiety   . Depression   . Diabetes mellitus without complication (HCC)   . Migraines   . Polycystic ovarian disease     Family History  Problem Relation Age of Onset  . Depression Mother        in response to grief/loss of husband  . Hypertension Father        Died sudenly at age 44. Reports father may have had Aspergers  . Depression Sister   . Anxiety disorder Sister   . ADD / ADHD Sister   . ADD / ADHD Cousin   . Asperger's syndrome Cousin     Social History    Socioeconomic History  . Marital status: Single    Spouse name: Not on file  . Number of children: Not on file  . Years of education: Not on file  . Highest education level: Not on file  Occupational History  . Not on file  Tobacco Use  . Smoking status: Current Every Day Smoker    Packs/day: 0.50    Years: 8.00    Pack years: 4.00    Types: Cigarettes  . Smokeless tobacco: Never Used  . Tobacco comment: plans to start nicotine gum  Substance and Sexual Activity  . Alcohol use: No    Comment: Described as episodic  . Drug use: No    Frequency: 7.0 times per week    Types: Marijuana    Comment: Last use 04/27/16  . Sexual activity: Not Currently    Birth control/protection:  Condom  Other Topics Concern  . Not on file  Social History Narrative  . Not on file   Social Determinants of Health   Financial Resource Strain: Not on file  Food Insecurity: Not on file  Transportation Needs: Not on file  Physical Activity: Not on file  Stress: Not on file  Social Connections: Not on file  Intimate Partner Violence: Not on file    Past Medical History, Surgical history, Social history, and Family history were reviewed and updated as appropriate.   Please see review of systems for further details on the patient's review from today.   Objective:   Physical Exam:  BP 119/71   Pulse 77   Wt 265 lb (120.2 kg)   BMI 41.50 kg/m   Physical Exam Neurological:     Mental Status: She is alert and oriented to person, place, and time.     Cranial Nerves: No dysarthria.  Psychiatric:        Attention and Perception: Attention and perception normal.        Mood and Affect: Mood is anxious. Mood is not depressed.        Speech: Speech normal.        Behavior: Behavior is cooperative.        Thought Content: Thought content normal. Thought content is not paranoid or delusional. Thought content does not include homicidal or suicidal ideation. Thought content does not include  homicidal or suicidal plan.        Cognition and Memory: Cognition and memory normal.        Judgment: Judgment normal.     Comments: Insight intact     Lab Review:     Component Value Date/Time   NA 141 04/28/2016 1836   K 3.7 04/28/2016 1836   CL 107 04/28/2016 1836   CO2 25 04/28/2016 1836   GLUCOSE 90 04/28/2016 1836   BUN 14 04/28/2016 1836   CREATININE 0.68 04/28/2016 1836   CALCIUM 9.5 04/28/2016 1836   PROT 8.3 (H) 04/28/2016 1836   ALBUMIN 4.6 04/28/2016 1836   AST 21 04/28/2016 1836   ALT 18 04/28/2016 1836   ALKPHOS 67 04/28/2016 1836   BILITOT 0.5 04/28/2016 1836   GFRNONAA >60 04/28/2016 1836   GFRAA >60 04/28/2016 1836       Component Value Date/Time   WBC 9.4 04/28/2016 1836   RBC 4.61 04/28/2016 1836   HGB 12.5 04/28/2016 1836   HCT 37.7 04/28/2016 1836   PLT 272 04/28/2016 1836   MCV 81.8 04/28/2016 1836   MCV 89.0 02/10/2013 1721   MCH 27.1 04/28/2016 1836   MCHC 33.2 04/28/2016 1836   RDW 13.8 04/28/2016 1836   LYMPHSABS 2.1 02/26/2013 0154   MONOABS 0.6 02/26/2013 0154   EOSABS 0.2 02/26/2013 0154   BASOSABS 0.0 02/26/2013 0154    No results found for: POCLITH, LITHIUM   No results found for: PHENYTOIN, PHENOBARB, VALPROATE, CBMZ   .res Assessment: Plan:   Patient seen for 30 minutes and time spent counseling patient regarding possible treatment options.  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  Discussed considering future medication changes/reductions once acute stressors improve.  Discussed considering starting therapy and provided patient with referrals.  Referral made to [facility], LCSW. Continue sertraline 100 mg daily for anxiety and depression. Continue Adderall 15 mg twice daily for ADHD. Continue Ambien 10 mg at bedtime as needed for insomnia. Continue Xanax 0.5  mg 3 times daily as needed. Continue Abilify 5 mg daily Patient to follow-up in 2 months or sooner if  clinically indicated. Patient advised to contact office with any questions, adverse effects, or acute worsening in signs and symptoms.  Wendy Maynard was seen today for anxiety and depression.  Diagnoses and all orders for this visit:  Attention deficit hyperactivity disorder (ADHD), predominantly inattentive type -     amphetamine-dextroamphetamine (ADDERALL) 15 MG tablet; Take 1 tablet by mouth 2 (two) times daily. -     amphetamine-dextroamphetamine (ADDERALL) 15 MG tablet; Take 1 tablet by mouth 2 (two) times daily. -     amphetamine-dextroamphetamine (ADDERALL) 15 MG tablet; Take 1 tablet by mouth 2 (two) times daily.  Recurrent major depressive disorder, in full remission (HCC)  Posttraumatic stress disorder  Primary insomnia -     zolpidem (AMBIEN) 10 MG tablet; Take 1 tablet (10 mg total) by mouth at bedtime as needed. for sleep     Please see After Visit Summary for patient specific instructions.  No future appointments.  No orders of the defined types were placed in this encounter.     -------------------------------

## 2020-07-19 ENCOUNTER — Other Ambulatory Visit: Payer: Self-pay | Admitting: Psychiatry

## 2020-07-19 DIAGNOSIS — F4310 Post-traumatic stress disorder, unspecified: Secondary | ICD-10-CM

## 2020-07-19 NOTE — Telephone Encounter (Signed)
Last filled 07/10/20

## 2020-07-28 ENCOUNTER — Other Ambulatory Visit: Payer: Self-pay

## 2020-07-28 ENCOUNTER — Ambulatory Visit (INDEPENDENT_AMBULATORY_CARE_PROVIDER_SITE_OTHER): Payer: BC Managed Care – PPO | Admitting: Psychiatry

## 2020-07-28 DIAGNOSIS — F411 Generalized anxiety disorder: Secondary | ICD-10-CM

## 2020-07-28 NOTE — Progress Notes (Signed)
[facility] Initial Adult Exam  Name: Wendy Maynard Date: 07/28/2020 MRN: [id number] DOB: Nov 29, 1975 PCP: Elvera Lennox, PA-C  Time spent: 60 minutes  Guardian/Payee:  n/a    Paperwork requested:  No   Reason for Visit /Presenting Problem: anxiety, denies any current/recent depression  Mental Status Exam:    Appearance:   Casual     Behavior:  Appropriate, Sharing, and Motivated  Motor:  Normal  Speech/Language:   Clear and Coherent  Affect:  anxious  Mood:  anxious  Thought process:  goal directed  Thought content:    Some obsessivness  Sensory/Perceptual disturbances:    WNL  Orientation:  oriented to person, place, time/date, situation, day of week, month of year, and year  Attention:  Good  Concentration:  Good  Memory:  WNL  Fund of knowledge:   Good  Insight:    Good  Judgment:   Good  Impulse Control:  Good   Reported Symptoms:  anxious  Risk Assessment: Danger to Self:  No Self-injurious Behavior: No Danger to Others: No Duty to Warn:no Physical Aggression / Violence:No  Access to Firearms a concern: No  Gang Involvement:No  Patient / guardian was educated about steps to take if suicide or homicide risk level increases between visits: Denies any SI. While future psychiatric events cannot be accurately predicted, the patient does not currently require acute inpatient psychiatric care and does not currently meet [facility] involuntary commitment criteria.  Substance Abuse History: Current substance abuse: No     Past Psychiatric History:   Previous psychological history is significant for anxiety, depression, and ptsd Outpatient Providers:Carson Sarvis History of Psych Hospitalization:  Yes 4 yrs ago Psychological Testing:  n/a    Abuse History: Victim of No.,  patient denies    Report needed: No. Victim of Neglect:No. Perpetrator of  n/a   Witness / Exposure to Domestic Violence: No   Protective Services Involvement: No  Witness to  [employer] Violence:  No   Family History: Patient confirms info below. Family History  Problem Relation Age of Onset   Depression Mother        in response to grief/loss of husband   Hypertension Father        Died sudenly at age 35. Reports father may have had Aspergers   Depression Sister    Anxiety disorder Sister    ADD / ADHD Sister    ADD / ADHD Cousin    Asperger's syndrome Cousin     Living situation: the patient lives with their mother and share expenses  Sexual Orientation:  Straight  Relationship Status: single  Name of spouse / other:n/a             If a parent, number of children / ages:none  Support Systems; friends, close to cousin, 2 friends at work, close to mom but sometimes issues since we live  Surveyor, quantity Stress:  No   Income/Employment/Disability: Employment  Financial planner: No   Educational History: Education: Risk manager:   [employer]  Any cultural differences that may affect / interfere with treatment:  not applicable   Recreation/Hobbies: games, books, making beaded jewelry  Stressors:Health problems Loss of 2 grandparents (dad's parents), and a friend who was 60 yrs old  Strengths:  Family, Friends, Church, Spirituality, Hopefulness, and Able to Communicate Effectively  Barriers:  "my tendency to help others before helping myself".  "Fear of getting better.Marland KitchenMarland KitchenI don't like change."   Legal History: Pending legal issue / charges: The  patient has no significant history of legal issues. History of legal issue / charges:  none  Medical History/Surgical History:Reviewed with patient and she confirms info below. Past Medical History:  Diagnosis Date   Abdominal pain in female patient 11/2012   Anxiety    Depression    Diabetes mellitus without complication (HCC)    Migraines    Polycystic ovarian disease     Past Surgical History:  Procedure Laterality Date   CHOLECYSTECTOMY N/A 02/26/2013    Procedure: LAPAROSCOPIC CHOLECYSTECTOMY WITH INTRAOPERATIVE CHOLANGIOGRAM;  Surgeon: Adolph Pollack, MD;  Location: [facility];  Service: General;  Laterality: N/A;   HYSTERECTOMY ABDOMINAL WITH SALPINGECTOMY     WISDOM TOOTH EXTRACTION      Medications: Confirmed with patient. Current Outpatient Medications  Medication Sig Dispense Refill   ALPRAZolam (XANAX) 0.5 MG tablet TAKE 1 TABLET (0.5 MG TOTAL) BY MOUTH 3 (THREE) TIMES DAILY AS NEEDED FOR ANXIETY. 30 tablet 2   [START ON 08/31/2020] amphetamine-dextroamphetamine (ADDERALL) 15 MG tablet Take 1 tablet by mouth 2 (two) times daily. 60 tablet 0   [START ON 08/03/2020] amphetamine-dextroamphetamine (ADDERALL) 15 MG tablet Take 1 tablet by mouth 2 (two) times daily. 60 tablet 0   amphetamine-dextroamphetamine (ADDERALL) 15 MG tablet Take 1 tablet by mouth 2 (two) times daily. 60 tablet 0   ARIPiprazole (ABILIFY) 5 MG tablet Take 1/2-1 po qd 90 tablet 1   Ascorbic Acid (VITAMIN C) 100 MG tablet Take 100 mg by mouth daily.     cyclobenzaprine (FLEXERIL) 10 MG tablet Take 1 tablet by mouth 3 (three) times daily as needed.     diclofenac (VOLTAREN) 75 MG EC tablet 2 (two) times daily as needed.  (Patient not taking: No sig reported)     estradiol (ESTRACE) 1 MG tablet Take 1 tablet (1 mg total) by mouth daily. 30 tablet 0   fexofenadine (ALLEGRA) 60 MG tablet Take 60 mg by mouth 2 (two) times daily as needed.     glycopyrrolate (ROBINUL) 1 MG tablet Take 1 mg by mouth 2 (two) times daily.     sertraline (ZOLOFT) 100 MG tablet Take 1.5 tablets (150 mg total) by mouth daily. (Patient taking differently: Take 100 mg by mouth daily.) 135 tablet 1   zolpidem (AMBIEN) 10 MG tablet Take 1 tablet (10 mg total) by mouth at bedtime as needed. for sleep 30 tablet 2   No current facility-administered medications for this visit.    Allergies  Allergen Reactions   Ciprofloxacin Hives and Other (See Comments)    Started in arm as soon as drug started.    Clindamycin/Lincomycin Diarrhea and Other (See Comments)    Causes excessive diarrhea   Vicodin [Hydrocodone-Acetaminophen] Itching and Nausea And Vomiting   Penicillins Rash and Other (See Comments)    Occurred at 45 years old Has patient had a PCN reaction causing immediate rash, facial/tongue/throat swelling, SOB or lightheadedness with hypotension: yes Has patient had a PCN reaction causing severe rash involving mucus membranes or skin necrosis: no Has patient had a PCN reaction that required hospitalization no Has patient had a PCN reaction occurring within the last 10 years:no If all of the above answers are "NO", then may proceed with Cephalosporin use.    Sulfa Antibiotics Rash    Diagnoses:    ICD-10-CM   1. Generalized anxiety disorder  F41.1       Treatment Goal Plan: Patient not signing treatment plan on computer screen due to Covid.  Treatment Goals: Treatment goals  remain on treatment as patient works with strategies to achieve her goals.  Progress is evaluated each session and documented in the "progress" section of note.  Long term goal: Stabilize anxiety level while increasing ability to function on a daily basis.  Short term goal: Identify major life conflicts from the past and present that form the basis for present anxiety.  Strategies: Reinforced client's insights into the role of her past emotional pain and present anxiety.  Support patient's work in further resolving these issues.  Progress / Plan of Care:  Today was initial therapy visit for patient and we completed an initial evaluation for her and the treatment goal plan.  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  She describes herself as a [place] who is having a difficult time with job frustration,  grief and loss and some long-term doubts and fears about herself.  Currently she expresses she has a "fear of not doing well as she is changing jobs again."  Her most recent issues of grief and loss stem from the loss of her father 2 to 3 years ago and an increased concern about her mother since that loss.  Patient lives with her mother and they share expenses, with some stressors off and on in their relationship.  She reports that she also lost her grandmother and grandfather in the past couple years and a 56 year old friend within about the same time span.  She has limited social involvement.  States that she is interested in the principles of CBT and wavers some on motivation for change, adding that "I am actually afraid of getting better because I am afraid of change and I do not like change".  Also acknowledged that she did not like what she was currently feeling and realizes that some changes could lead to her feeling better eventually.  She does appear anxious today and she denies any depression which has been a symptom that patient has struggled with in the past.  She is calm, casually dressed, does appear anxious, and in spite of "not liking change", I think she does have some motivation for wanting to feel better and understands that change will be a part of what helps make that happen.  She denies any thoughts of harming herself or anyone else, and was able to be more actively involved as we worked on her treatment goal plan.  Encouraged her to use journaling as a tool if she feels that might be helpful in between sessions, and especially make notes if she thinks of anything that she had wanted to share today and did not, and also what are her initial priorities as we begin work on her goal plan.  Did review her goals again before ending session today and she is in agreement with those goals.  Next appointment within 2 to 3 weeks, depending on her new work schedule when she gets it within the next few  days.    Mathis Fare, LCSW

## 2020-08-03 ENCOUNTER — Other Ambulatory Visit: Payer: Self-pay

## 2020-08-03 ENCOUNTER — Telehealth: Payer: Self-pay | Admitting: Psychiatry

## 2020-08-03 DIAGNOSIS — F4310 Post-traumatic stress disorder, unspecified: Secondary | ICD-10-CM

## 2020-08-03 DIAGNOSIS — F900 Attention-deficit hyperactivity disorder, predominantly inattentive type: Secondary | ICD-10-CM

## 2020-08-03 DIAGNOSIS — F5101 Primary insomnia: Secondary | ICD-10-CM

## 2020-08-03 MED ORDER — ZOLPIDEM TARTRATE 10 MG PO TABS: 10.0000 mg | ORAL_TABLET | Freq: Every evening | ORAL | 0 refills | Status: DC | PRN

## 2020-08-03 MED ORDER — ALPRAZOLAM 0.5 MG PO TABS: 0.5000 mg | ORAL_TABLET | Freq: Three times a day (TID) | ORAL | 1 refills | Status: DC | PRN

## 2020-08-03 MED ORDER — AMPHETAMINE-DEXTROAMPHETAMINE 15 MG PO TABS: 15.0000 mg | ORAL_TABLET | Freq: Two times a day (BID) | ORAL | 0 refills | Status: DC

## 2020-08-03 NOTE — Telephone Encounter (Signed)
Wendy Maynard called to check status of refills of her Xanax, Ambien and Adderall.  She knows there is [facility] [place], [place] but she can't fill it until Sunday and by Sunday she will be in [place].  She needs the prescriptions in [place] cancelled and [facility] ones [facility] [address]  So again she needs prescriptions for all her medications sent to the new pharmacy here in [place].

## 2020-08-03 NOTE — Telephone Encounter (Signed)
Pt doesn't need adderall but the ambien and x[facility]ax [facility] [address]. Her adderall for July needs to be cancelled and [facility] one [facility].

## 2020-08-03 NOTE — Telephone Encounter (Signed)
Pt is moving back to [place] this weekend and she needs her xanax, [place] ad adderall

## 2020-08-03 NOTE — Telephone Encounter (Signed)
Cancelled and pended  

## 2020-08-31 ENCOUNTER — Telehealth: Payer: Self-pay | Admitting: Psychiatry

## 2020-08-31 NOTE — Telephone Encounter (Signed)
Pt requesting new Rx for Ambien and Adderall @ [facility] [address]   apt 8/25

## 2020-09-01 ENCOUNTER — Other Ambulatory Visit: Payer: Self-pay

## 2020-09-01 DIAGNOSIS — F5101 Primary insomnia: Secondary | ICD-10-CM

## 2020-09-01 MED ORDER — ZOLPIDEM TARTRATE 10 MG PO TABS: 10.0000 mg | ORAL_TABLET | Freq: Every evening | ORAL | 0 refills | Status: DC | PRN

## 2020-09-01 NOTE — Telephone Encounter (Signed)
Both Rx's last filled 6/26 Pended Ambien but pt should have 1 Adderall Rx left. Will contact pharmacy to confirm

## 2020-09-04 ENCOUNTER — Ambulatory Visit (INDEPENDENT_AMBULATORY_CARE_PROVIDER_SITE_OTHER): Payer: Self-pay | Admitting: Psychiatry

## 2020-09-04 ENCOUNTER — Other Ambulatory Visit: Payer: Self-pay

## 2020-09-04 DIAGNOSIS — F411 Generalized anxiety disorder: Secondary | ICD-10-CM

## 2020-09-04 NOTE — Progress Notes (Signed)
[facility] Counselor/Therapist Progress Note  Patient ID: Wendy Maynard, MRN: [id number],    Date: 09/04/2020  Time Spent: 58 minutes  Treatment Type: Individual Therapy  Reported Symptoms: Anxiety, anger  Mental Status Exam:  Appearance:   Casual     Behavior:  Appropriate, Sharing, and Motivated  Motor:  Normal  Speech/Language:   Clear and Coherent  Affect:  anxious  Mood:  angry and anxious  Thought process:  goal directed  Thought content:    WNL  Sensory/Perceptual disturbances:    WNL  Orientation:  oriented to person, place, time/date, situation, day of week, month of year, year, and stated date of September 04, 2020  Attention:  Good  Concentration:  Good  Memory:  WNL  Fund of knowledge:   Good  Insight:    Good and Fair  Judgment:   Good  Impulse Control:  Good   Risk Assessment: Danger to Self:  No Self-injurious Behavior: No Danger to Others: No Duty to Warn:no Physical Aggression / Violence:No  Access to Firearms a concern: No  Gang Involvement:No   Subjective:   Patient in today reporting anxiety and some anger. 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 Shares that she doesn't talk to others about her personal business and concerns,so is glad to be therapy to be able to talk through her concerns around grief and loss and some self-doubts.  Dad died suddenly died 3 1/2 years ago of brain "issue" and patient was very close to him. Was not close to him until she was 22, but their relationship hit a turning point and got increasingly better as time progressed. This was very much on her mind today as last session was her initial evaluation and didn't really have time to process her feelings more thoroughly. Also some interpersonal issues with mom and "that is something I will need to talk about in therapy also so as to resolve some things in my own mind and live more peaceably within myself."  States that "I definitely need to learn more about boundaries and  how to have them and use them with others as needed. "I feel like I'm the family keeper at times." Also "fearful in some relationships/people especially if they are very loud. "Tearful as she spoke about her dad and the close relationship they had for years. Looked more at the areas she is needing to work on, to have more activities in life, develop more friendships, get good rest/sleep, feel better about herself overall, be a part of helping develop better relationship within family and with others. Focusing on her own self-care setting limits and boundaries, and more positive self-talk important for patient to be working on.  She also agreed to do some journaling in between session and will bring in next time.  We will plan to discuss further the principles of CBT that we worked on some in session today, using a large CB poster and patient related well to this.   Interventions: Cognitive Behavioral Therapy and Insight-Oriented  Diagnosis:   ICD-10-CM   1. Generalized anxiety disorder  F41.1         Treatment Goal Plan: Patient not signing treatment plan on computer screen due to Covid. Treatment Goals: Treatment goals remain on treatment as patient works with strategies to achieve her goals.  Progress is evaluated each session and documented in the "progress" section of note. Long term goal: Stabilize anxiety level while increasing ability to function on a daily basis.  Short term goal: Identify major life conflicts from the past and present that form the basis for present anxiety. Strategies: Reinforced client's insights into the role of her past emotional pain and present anxiety.  Support patient's work in further resolving these issues.     Plan:  Patient today showing good engagement and motivation in therapy session.  She started off feeling "mid range" motivation but her motivation really picked up speed as she worked in session and her affect became more full and optimistic as  well.  Encouraged her to use journaling as a tool as she has used that on previous occasions in other situations to work through feelings.  Also encouraged her to use some behaviors that can be helpful in between appointments including: Getting outside daily and walking, having some quiet time just for herself each day, interact more with coworkers, stay in touch with supportive people, stay in the present focused on what she can control, look for what might go right versus wrong, look for positives within herself, intentionally look for more positives and negatives every day, good nutrition and exercise, consistent positive self talk, and feel good about the strength she is showing as she works with goal-directed behaviors in the midst of challenging circumstances to move forward in a more positive direction of improved emotional health.  Goal review and progress/challenges noted with patient.  Next appointment within 2 to 3 weeks.   Mathis Fare, LCSW

## 2020-09-05 ENCOUNTER — Telehealth: Payer: Self-pay | Admitting: Psychiatry

## 2020-09-05 ENCOUNTER — Other Ambulatory Visit: Payer: Self-pay

## 2020-09-05 DIAGNOSIS — F900 Attention-deficit hyperactivity disorder, predominantly inattentive type: Secondary | ICD-10-CM

## 2020-09-05 MED ORDER — AMPHETAMINE-DEXTROAMPHETAMINE 15 MG PO TABS: 15.0000 mg | ORAL_TABLET | Freq: Two times a day (BID) | ORAL | 0 refills | Status: DC

## 2020-09-05 NOTE — Telephone Encounter (Signed)
Pended.

## 2020-09-05 NOTE — Telephone Encounter (Signed)
Pt said that she called [facility] on [address] for her rx of Adderall and they told her that it was sent to another pharmacy. Looks like it was sent today, but hey can't see it. Please resend to [facility] [address].

## 2020-09-08 ENCOUNTER — Telehealth: Payer: BC Managed Care – PPO | Admitting: Psychiatry

## 2020-09-20 ENCOUNTER — Ambulatory Visit (INDEPENDENT_AMBULATORY_CARE_PROVIDER_SITE_OTHER): Payer: 59 | Admitting: Psychiatry

## 2020-09-20 ENCOUNTER — Other Ambulatory Visit: Payer: Self-pay

## 2020-09-20 DIAGNOSIS — F411 Generalized anxiety disorder: Secondary | ICD-10-CM
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
## 2020-10-02 ENCOUNTER — Other Ambulatory Visit: Payer: Self-pay

## 2020-10-02 ENCOUNTER — Telehealth: Payer: Self-pay | Admitting: Psychiatry

## 2020-10-02 ENCOUNTER — Other Ambulatory Visit: Payer: Self-pay | Admitting: Psychiatry

## 2020-10-02 DIAGNOSIS — F900 Attention-deficit hyperactivity disorder, predominantly inattentive type: Secondary | ICD-10-CM

## 2020-10-02 DIAGNOSIS — F5101 Primary insomnia: Secondary | ICD-10-CM

## 2020-10-02 MED ORDER — AMPHETAMINE-DEXTROAMPHETAMINE 15 MG PO TABS: 15.0000 mg | ORAL_TABLET | Freq: Two times a day (BID) | ORAL | 0 refills | Status: DC

## 2020-10-02 NOTE — Telephone Encounter (Signed)
Last filled 7/26 appt 8/25

## 2020-10-02 NOTE — Telephone Encounter (Signed)
Pended.

## 2020-10-02 NOTE — Telephone Encounter (Signed)
Next visit is 10/04/20. Requesting refill on Ambien and Adderall called to:  [id number] [place], [place] - [address] Phone:  [phone]        Phone:  [phone]  Fax:  [phone]

## 2020-10-04 ENCOUNTER — Other Ambulatory Visit: Payer: Self-pay

## 2020-10-04 ENCOUNTER — Ambulatory Visit (INDEPENDENT_AMBULATORY_CARE_PROVIDER_SITE_OTHER): Payer: 59 | Admitting: Psychiatry

## 2020-10-04 DIAGNOSIS — F411 Generalized anxiety disorder: Secondary | ICD-10-CM | POA: Diagnosis not present

## 2020-10-04 NOTE — Progress Notes (Signed)
[facility] Counselor/Therapist Progress Note  Patient ID: Wendy Maynard, MRN: [id number],    Date: 10/04/2020  Time Spent: 58 minutes   Treatment Type: Individual Therapy  Reported Symptoms: anxiety  Mental Status Exam:  Appearance:   Casual     Behavior:  Appropriate, Sharing, and Motivated  Motor:  Normal  Speech/Language:   Clear and Coherent  Affect:  anxious  Mood:  anxious  Thought process:  goal directed  Thought content:    WNL  Sensory/Perceptual disturbances:    WNL  Orientation:  oriented to person, place, time/date, situation, day of week, month of year, year, and stated date of Aug. 24, 2022  Attention:  Good  Concentration:  Good  Memory:  WNL  Fund of knowledge:   Good  Insight:    Good  Judgment:   Good  Impulse Control:  Good   Risk Assessment: Danger to Self:  No Self-injurious Behavior: No Danger to Others: No Duty to Warn:no Physical Aggression / Violence:No  Access to Firearms a concern: No  Gang Involvement:No   Subjective: Patient in today reporting anxiety as her main symptom, and "I feel like it's more internal."  Talked almost non-stop (not manic-like) in sharing her concerns, and doesn't have many people close to her.  "When I have more time to think, I tend to be more anxious."  Less stressed at work, not many people there, has free space, and is more structured. Comments on her statement at end of last session that "I feel like the family keeper" often times and wanted to process it some today. Shares that she's talked with her mom since then and realizes "I'm really not the family keeper", "I haven't felt it as much this past week as I've done the journaling we spoke about last session and it was very helpful."  Still thinking about "possible losses in the future" of mom or my dog. Speaks more about her grief/loss/self-doubt.  Struggles with anxiety and "it makes me feel out of control."  Discussed understanding anxiety better and being able  to better manage it. 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  Feeling grateful that I don't share all the issues other family members have including overdrinking.  *Wants to learn how to regulate her emotions more and better manage frustrations." *Doesn't want to experience as many anticipatory losses, and wants to think in more positive directions."   Interventions: Cognitive Behavioral Therapy and Solution-Oriented/Positive Psychology  Diagnosis:   ICD-10-CM   1. Generalized anxiety disorder  F41.1       Treatment Goal Plan: Patient not signing treatment plan on computer screen due to Covid. Treatment Goals: Treatment goals remain on treatment as patient works with strategies to achieve her goals.  Progress is evaluated each session and documented in the "progress" section of note. Long term goal: Stabilize anxiety level while increasing ability to function on a daily basis. Short term goal: Identify major life conflicts from the past and present that form the basis for present anxiety. Strategies: Reinforced client's insights into the role of her past emotional pain and present anxiety.  Support patient's work in further resolving these issues.  Plan:  Patient today showing good motivation and participation in session still sharing several of the different issues that she is wanting to address in sessions, including: Anxiety, anticipating bad things happening, focusing on future losses before they happen, relationship issues within family, difficulty regulating my anxiety, unhealthy self talk, having a more positive outlook, and regulating my emotions more.  Because she has had some good experience  with journaling previously, I encouraged patient to continue using that as a tool between sessions.  Also encouraged patient to practice some behaviors that can be helpful including: Getting outside daily and walking, staying in touch with supportive people, interacting more with coworkers, having some quiet time each day just for herself, staying in the present focusing on what she can control, looking for what might go right versus wrong, work to be more hopeful, look for more positives within herself, intentionally look for more positives versus negatives daily, good nutrition and exercise, allow for good sleep habits, consistent positive self talk, and feel good about the strength that she is showing as she works with goal-directed behaviors in the midst of challenging circumstances to move forward in a more positive direction of improved emotional health.  Goal review and progress/challenges noted with patient.  Next appointment within 2 weeks.   Mathis Fare, LCSW

## 2020-10-05 ENCOUNTER — Other Ambulatory Visit (HOSPITAL_BASED_OUTPATIENT_CLINIC_OR_DEPARTMENT_OTHER): Payer: Self-pay

## 2020-10-05 ENCOUNTER — Encounter: Payer: Self-pay | Admitting: Psychiatry

## 2020-10-05 ENCOUNTER — Telehealth (INDEPENDENT_AMBULATORY_CARE_PROVIDER_SITE_OTHER): Payer: 59 | Admitting: Psychiatry

## 2020-10-05 DIAGNOSIS — F900 Attention-deficit hyperactivity disorder, predominantly inattentive type: Secondary | ICD-10-CM | POA: Diagnosis not present

## 2020-10-05 DIAGNOSIS — F4310 Post-traumatic stress disorder, unspecified: Secondary | ICD-10-CM | POA: Diagnosis not present

## 2020-10-05 DIAGNOSIS — F5101 Primary insomnia: Secondary | ICD-10-CM

## 2020-10-05 DIAGNOSIS — F332 Major depressive disorder, recurrent severe without psychotic features: Secondary | ICD-10-CM

## 2020-10-05 DIAGNOSIS — F3342 Major depressive disorder, recurrent, in full remission: Secondary | ICD-10-CM | POA: Diagnosis not present

## 2020-10-05 MED ORDER — ZOLPIDEM TARTRATE 10 MG PO TABS
10.0000 mg | ORAL_TABLET | Freq: Every evening | ORAL | 0 refills | Status: DC | PRN
Start: 2020-10-30 — End: 2020-11-20
  Filled 2020-10-05 – 2020-10-23 (×2): qty 30, 30d supply, fill #0

## 2020-10-05 MED ORDER — ALPRAZOLAM 0.5 MG PO TABS
0.5000 mg | ORAL_TABLET | Freq: Three times a day (TID) | ORAL | 1 refills | Status: DC | PRN
  Filled 2020-10-05 – 2020-11-06 (×2): qty 90, 30d supply, fill #0

## 2020-10-05 MED ORDER — SERTRALINE HCL 100 MG PO TABS
100.0000 mg | ORAL_TABLET | Freq: Every day | ORAL | 1 refills | Status: DC
  Filled 2020-10-05: qty 90, 90d supply, fill #0

## 2020-10-05 MED ORDER — ARIPIPRAZOLE 5 MG PO TABS
ORAL_TABLET | ORAL | 1 refills | Status: DC
  Filled 2020-10-05 – 2020-12-07 (×2): qty 90, 90d supply, fill #0

## 2020-10-05 MED ORDER — AMPHETAMINE-DEXTROAMPHETAMINE 15 MG PO TABS
15.0000 mg | ORAL_TABLET | Freq: Two times a day (BID) | ORAL | 0 refills | Status: DC
  Filled 2020-12-07: qty 60, 30d supply, fill #0

## 2020-10-05 MED ORDER — AMPHETAMINE-DEXTROAMPHETAMINE 15 MG PO TABS
15.0000 mg | ORAL_TABLET | Freq: Two times a day (BID) | ORAL | 0 refills | Status: DC
  Filled 2020-11-06: qty 60, 30d supply, fill #0

## 2020-10-05 NOTE — Progress Notes (Signed)
Wendy Maynard [id number] Oct 06, 1975 45 y.o.  Virtual Visit via Video Note  I connected with pt @ on 10/05/20 at  1:00 PM EDT by a video enabled telemedicine application and verified that I am speaking with the correct person using two identifiers.   I discussed the limitations of evaluation and management by telemedicine and the availability of in person appointments. The patient expressed understanding and agreed to proceed.  I discussed the assessment and treatment plan with the patient. The patient was provided an opportunity to ask questions and all were answered. The patient agreed with the plan and demonstrated an understanding of the instructions.   The patient was advised to call back or seek an in-person evaluation if the symptoms worsen or if the condition fails to improve as anticipated.  I provided 30 minutes of non-face-to-face time during this encounter.  The patient was located at home.  The provider was located at [facility].   Corie Chiquito, PMHNP   Subjective:   Patient ID:  Wendy Maynard is a 45 y.o. (DOB [birth year]) female.  Chief Complaint:  Chief Complaint  Patient presents with   Follow-up    H/o anxiety, depression, ADHD, and insomnia     HPI Wendy Maynard presents for follow-up of depression, anxiety, ADHD, and insomnia. 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 She reports that her new job is going well and is significantly less stressful compared to her last job. Has been staying in an Air B&B. Moving into a condo with her mother this weekend. Has had stress in response to deciding where she should live. Reports anxiety has improved over the last few weeks. Mood has been "ok." Denies depressed mood. Sleep has been ok over all with some difficulty on occasion. Appetite has been ok. Concentration is adequate with Adderall. Reports that she is motivated to do certain things, such as her job, moving, and helping her mother. Denies SI or HI.    Plans to start aqua jogging.   Started therapy with Rockne Menghini, LCSW and reports that this has been helpful.   Reports sleep paralysis since childhood. She reports sleep paralysis occurs a few times a month. She reports excessive daytime somnolence. She reports significant snoring and periodically wakes herself up. No observed apnea, gasping, or choking. Denies RLS.   Would like to try to stop smoking and plans to use the gum. Has not been able to tolerate nicotine patch.   Has not needed Xanax prn in the last few weeks.   Review of Systems:  Review of Systems  Cardiovascular:  Negative for palpitations.  Musculoskeletal:  Negative for gait problem.  Neurological:  Negative for tremors and headaches.  Psychiatric/Behavioral:         Please refer to HPI   Medications: I have reviewed the patient's current medications.  Current Outpatient Medications  Medication Sig Dispense Refill   Ascorbic Acid (VITAMIN C) 100 MG tablet Take 100 mg by mouth daily.     cyclobenzaprine (FLEXERIL) 10 MG tablet Take 1 tablet by mouth 3 (three) times daily as needed.     estradiol (ESTRACE) 1 MG tablet Take 1 tablet (1 mg total) by mouth daily. 30 tablet 0   glycopyrrolate (ROBINUL) 1 MG tablet Take 1 mg by mouth 2 (two) times daily.     ALPRAZolam (XANAX) 0.5 MG tablet Take 1 tablet (0.5 mg total) by mouth 3 (three) times daily as needed for anxiety. 90 tablet 1   amphetamine-dextroamphetamine (ADDERALL) 15  MG tablet Take 1 tablet by mouth 2 (two) times daily. 60 tablet 0   [START ON 11/30/2020] amphetamine-dextroamphetamine (ADDERALL) 15 MG tablet Take 1 tablet by mouth 2 (two) times daily. 60 tablet 0   [START ON 11/02/2020] amphetamine-dextroamphetamine (ADDERALL) 15 MG tablet Take 1 tablet by mouth 2 (two) times daily. 60 tablet 0   ARIPiprazole (ABILIFY) 5 MG tablet Take 1/2-1 po qd 90 tablet 1   diclofenac (VOLTAREN) 75 MG EC tablet 2 (two) times daily as needed.  (Patient not taking: No sig  reported)     fexofenadine (ALLEGRA) 60 MG tablet Take 60 mg by mouth 2 (two) times daily as needed.     sertraline (ZOLOFT) 100 MG tablet Take 1 tablet (100 mg total) by mouth daily. 90 tablet 1   [START ON 10/30/2020] zolpidem (AMBIEN) 10 MG tablet Take 1 tablet (10 mg total) by mouth at bedtime as needed. for sleep 30 tablet 0   No current facility-administered medications for this visit.    Medication Side Effects: None  Allergies:  Allergies  Allergen Reactions   Ciprofloxacin Hives and Other (See Comments)    Started in arm as soon as drug started.   Clindamycin/Lincomycin Diarrhea and Other (See Comments)    Causes excessive diarrhea   Vicodin [Hydrocodone-Acetaminophen] Itching and Nausea And Vomiting   Penicillins Rash and Other (See Comments)    Occurred at 45 years old Has patient had a PCN reaction causing immediate rash, facial/tongue/throat swelling, SOB or lightheadedness with hypotension: yes Has patient had a PCN reaction causing severe rash involving mucus membranes or skin necrosis: no Has patient had a PCN reaction that required hospitalization no Has patient had a PCN reaction occurring within the last 10 years:no If all of the above answers are "NO", then may proceed with Cephalosporin use.    Sulfa Antibiotics Rash    Past Medical History:  Diagnosis Date   Abdominal pain in female patient 11/2012   Anxiety    Depression    Diabetes mellitus without complication (HCC)    Migraines    Polycystic ovarian disease     Family History  Problem Relation Age of Onset   Depression Mother        in response to grief/loss of husband   Hypertension Father        Died sudenly at age 64. Reports father may have had Aspergers   Depression Sister    Anxiety disorder Sister    ADD / ADHD Sister    ADD / ADHD Cousin    Asperger's syndrome Cousin     Social History   Socioeconomic History   Marital status: Single    Spouse name: Not on file   Number of  children: Not on file   Years of education: Not on file   Highest education level: Not on file  Occupational History   Not on file  Tobacco Use   Smoking status: Every Day    Packs/day: 0.50    Years: 8.00    Pack years: 4.00    Types: Cigarettes   Smokeless tobacco: Never   Tobacco comments:    plans to start nicotine gum  Substance and Sexual Activity   Alcohol use: No    Comment: Described as episodic   Drug use: No    Frequency: 7.0 times per week    Types: Marijuana    Comment: Last use 04/27/16   Sexual activity: Not Currently    Birth control/protection: Condom  Other  Topics Concern   Not on file  Social History Narrative   Not on file   Social Determinants of Health   Financial Resource Strain: Not on file  Food Insecurity: Not on file  Transportation Needs: Not on file  Physical Activity: Not on file  Stress: Not on file  Social Connections: Not on file  Intimate Partner Violence: Not on file    Past Medical History, Surgical history, Social history, and Family history were reviewed and updated as appropriate.   Please see review of systems for further details on the patient's review from today.   Objective:   Physical Exam:  Wt 260 lb (117.9 kg)   BMI 40.72 kg/m   Physical Exam Neurological:     Mental Status: She is alert and oriented to person, place, and time.     Cranial Nerves: No dysarthria.  Psychiatric:        Attention and Perception: Attention and perception normal.        Mood and Affect: Mood normal.        Speech: Speech normal.        Behavior: Behavior is cooperative.        Thought Content: Thought content normal. Thought content is not paranoid or delusional. Thought content does not include homicidal or suicidal ideation. Thought content does not include homicidal or suicidal plan.        Cognition and Memory: Cognition and memory normal.        Judgment: Judgment normal.     Comments: Insight intact    Lab Review:      Component Value Date/Time   NA 141 04/28/2016 1836   K 3.7 04/28/2016 1836   CL 107 04/28/2016 1836   CO2 25 04/28/2016 1836   GLUCOSE 90 04/28/2016 1836   BUN 14 04/28/2016 1836   CREATININE 0.68 04/28/2016 1836   CALCIUM 9.5 04/28/2016 1836   PROT 8.3 (H) 04/28/2016 1836   ALBUMIN 4.6 04/28/2016 1836   AST 21 04/28/2016 1836   ALT 18 04/28/2016 1836   ALKPHOS 67 04/28/2016 1836   BILITOT 0.5 04/28/2016 1836   GFRNONAA >60 04/28/2016 1836   GFRAA >60 04/28/2016 1836       Component Value Date/Time   WBC 9.4 04/28/2016 1836   RBC 4.61 04/28/2016 1836   HGB 12.5 04/28/2016 1836   HCT 37.7 04/28/2016 1836   PLT 272 04/28/2016 1836   MCV 81.8 04/28/2016 1836   MCV 89.0 02/10/2013 1721   MCH 27.1 04/28/2016 1836   MCHC 33.2 04/28/2016 1836   RDW 13.8 04/28/2016 1836   LYMPHSABS 2.1 02/26/2013 0154   MONOABS 0.6 02/26/2013 0154   EOSABS 0.2 02/26/2013 0154   BASOSABS 0.0 02/26/2013 0154    No results found for: POCLITH, LITHIUM   No results found for: PHENYTOIN, PHENOBARB, VALPROATE, CBMZ   .res Assessment: Plan:   Will continue current plan of care since target signs and symptoms are well controlled without any tolerability issues. Recommend continuing psychotherapy with Rockne Menghini, LCSW. Patient follow-up with this provider in 3 months or sooner if clinically indicated. Patient advised to contact office with any questions, adverse effects, or acute worsening in signs and symptoms.   Mylene was seen today for follow-up.  Diagnoses and all orders for this visit:  Attention deficit hyperactivity disorder (ADHD), predominantly inattentive type -     amphetamine-dextroamphetamine (ADDERALL) 15 MG tablet; Take 1 tablet by mouth 2 (two) times daily. -     amphetamine-dextroamphetamine (ADDERALL)  15 MG tablet; Take 1 tablet by mouth 2 (two) times daily.  Posttraumatic stress disorder -     ALPRAZolam (XANAX) 0.5 MG tablet; Take 1 tablet (0.5 mg total) by mouth 3 (three)  times daily as needed for anxiety. -     sertraline (ZOLOFT) 100 MG tablet; Take 1 tablet (100 mg total) by mouth daily.  Severe episode of recurrent major depressive disorder, without psychotic features (HCC)  Recurrent major depressive disorder, in full remission (HCC) -     ARIPiprazole (ABILIFY) 5 MG tablet; Take 1/2-1 po qd -     sertraline (ZOLOFT) 100 MG tablet; Take 1 tablet (100 mg total) by mouth daily.  Primary insomnia -     zolpidem (AMBIEN) 10 MG tablet; Take 1 tablet (10 mg total) by mouth at bedtime as needed. for sleep    Please see After Visit Summary for patient specific instructions.  Future Appointments  Date Time Provider Department Center  10/18/2020  5:00 PM Mathis FareDowd, Deborah, LCSW [facility] None  11/01/2020  5:00 PM Mathis Fareowd, Deborah, LCSW [facility] None  11/15/2020  5:00 PM Mathis Fareowd, Deborah, LCSW [facility] None  11/29/2020  5:00 PM Mathis Fareowd, Deborah, LCSW [facility] None  12/13/2020  5:00 PM Mathis Fareowd, Deborah, LCSW [facility] None  01/01/2021  5:00 PM Mathis Fareowd, Deborah, LCSW [facility] None    No orders of the defined types were placed in this encounter.     -------------------------------

## 2020-10-11 ENCOUNTER — Other Ambulatory Visit (HOSPITAL_BASED_OUTPATIENT_CLINIC_OR_DEPARTMENT_OTHER): Payer: Self-pay

## 2020-10-13 ENCOUNTER — Other Ambulatory Visit (HOSPITAL_BASED_OUTPATIENT_CLINIC_OR_DEPARTMENT_OTHER): Payer: Self-pay

## 2020-10-18 ENCOUNTER — Other Ambulatory Visit: Payer: Self-pay

## 2020-10-18 ENCOUNTER — Ambulatory Visit (INDEPENDENT_AMBULATORY_CARE_PROVIDER_SITE_OTHER): Payer: 59 | Admitting: Psychiatry

## 2020-10-18 DIAGNOSIS — F411 Generalized anxiety disorder: Secondary | ICD-10-CM

## 2020-10-18 NOTE — Progress Notes (Signed)
[facility] Counselor/Therapist Progress Note  Patient ID: Jalyah Weinheimer, MRN: [id number],    Date: 10/18/2020  Time Spent: 58 minutes   Treatment Type: Individual Therapy  Reported Symptoms: anxiety, some tearfulness  Mental Status Exam:  Appearance:   Casual     Behavior:  Appropriate, Sharing, and Motivated  Motor:  Normal  Speech/Language:   Clear and Coherent  Affect:  anxiety  Mood:  anxious  Thought process:  goal directed  Thought content:    WNL  Sensory/Perceptual disturbances:    WNL  Orientation:  oriented to person, place, time/date, situation, day of week, month of year, year, and stated date of Sept. 7, 2022  Attention:  Good  Concentration:  Good  Memory:  Some short term forgetting and is letting Dr know  Effie Shy of knowledge:   Good  Insight:    Good  Judgment:   Good  Impulse Control:  Good   Risk Assessment: Danger to Self:  No Self-injurious Behavior: No Danger to Others: No Duty to Warn:no Physical Aggression / Violence:No  Access to Firearms a concern: No  Gang Involvement:No   Subjective: Patient in today reporting anxiety and that it has decreased some more recently.  Has "longer term and more persistent anxiety" ,sometimes anger, that is easily triggered. Beginning to set better boundaries and limits with people.Adds that the further away she gets from her prior job, the better she is. Issues with mom an sister and trying to worry less and have healthier boundaries with both. The more "I work at this , the less I worry. "Wish my mom and sister would reconnect but so far neither has taken the first step." Concerned about mom's health and worries about losing her to death. "Really afraid of her death because she is the person I'm closer to." Processes her anxious feelings about this and able to share her concerns openly. Shares that she feels comforted by her church services at her [employer] whicn is very similar to regular Jewish  congregations. Was crying near end of session and shared "I probably need to talk more about my family growing up" and states she will bring this up next session.Tearful and expressed how it helps to have a safe place to cry and be heard. Shares her prior comment again "My anxiety is like a thermostat and I try to keep it around 70 degrees where I'm comfortable."    Interventions: Cognitive Behavioral Therapy, Solution-Oriented/Positive Psychology, and Insight-Oriented  Diagnosis:   ICD-10-CM   1. Generalized anxiety disorder  F41.1        Treatment Goal Plan: Patient not signing treatment plan on computer screen due to Covid. Treatment Goals: Treatment goals remain on treatment as patient works with strategies to achieve her goals.  Progress is evaluated each session and documented in the "progress" section of note. Long term goal: Stabilize anxiety level while increasing ability to function on a daily basis. Short term goal: Identify major life conflicts from the past and present that form the basis for present anxiety. Strategies: Reinforced client's insights into the role of her past emotional pain and present anxiety.  Support patient's work in further resolving these issues.      Plan:  Patient today showing good motivation and engagement in session today as she worked on goal directed behaviors regarding family and personal concerns as noted above.  She has reported that she typically has a hard time dealing with her emotions but seems to be  gaining more comfort in that and realizing she has a lot of uncomfortable emotions from her past which she is gradually processing in sessions.  Her priorities include to continue processing some anxiety, history within the family and relationship issues, anticipating bad things happening, focusing on future losses before they happen, unhealthy self talk, having a more positive outlook, and regulating her emotions more.  Patient encouraged to  practice positive behaviors that can be helpful to her including: Staying in touch with people that feel supportive, interacting more with coworkers in positive ways, having some quiet time each day just for herself, getting outside daily and walking, staying in the present focusing on what she can control, looking for what might go right versus wrong, work to be more hopeful, look for more positives within herself, intentionally look for more positives versus negatives each day, good nutrition and exercise, allow for good sleep patterns, consistent positive self talk, and recognize the strength that she is showing as she works with goal-directed behaviors and efforts to move forward in a more positive direction of improved emotional health.  Goal review and progress/challenges noted with patient.  Next appointment within 2 weeks.   Mathis Fare, LCSW

## 2020-10-20 ENCOUNTER — Encounter (HOSPITAL_BASED_OUTPATIENT_CLINIC_OR_DEPARTMENT_OTHER): Payer: Self-pay

## 2020-10-20 DIAGNOSIS — G4709 Other insomnia: Secondary | ICD-10-CM

## 2020-10-20 DIAGNOSIS — G4710 Hypersomnia, unspecified: Secondary | ICD-10-CM

## 2020-10-20 DIAGNOSIS — R0683 Snoring: Secondary | ICD-10-CM

## 2020-10-20 DIAGNOSIS — G478 Other sleep disorders: Secondary | ICD-10-CM

## 2020-10-20 DIAGNOSIS — R5383 Other fatigue: Secondary | ICD-10-CM

## 2020-10-23 ENCOUNTER — Other Ambulatory Visit (HOSPITAL_BASED_OUTPATIENT_CLINIC_OR_DEPARTMENT_OTHER): Payer: Self-pay

## 2020-10-25 ENCOUNTER — Other Ambulatory Visit (HOSPITAL_BASED_OUTPATIENT_CLINIC_OR_DEPARTMENT_OTHER): Payer: Self-pay

## 2020-11-01 ENCOUNTER — Other Ambulatory Visit: Payer: Self-pay

## 2020-11-01 ENCOUNTER — Ambulatory Visit (INDEPENDENT_AMBULATORY_CARE_PROVIDER_SITE_OTHER): Payer: 59 | Admitting: Psychiatry

## 2020-11-01 DIAGNOSIS — F411 Generalized anxiety disorder: Secondary | ICD-10-CM
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
## 2020-11-06 ENCOUNTER — Other Ambulatory Visit (HOSPITAL_BASED_OUTPATIENT_CLINIC_OR_DEPARTMENT_OTHER): Payer: Self-pay

## 2020-11-07 ENCOUNTER — Other Ambulatory Visit (HOSPITAL_BASED_OUTPATIENT_CLINIC_OR_DEPARTMENT_OTHER): Payer: Self-pay

## 2020-11-15 ENCOUNTER — Ambulatory Visit: Payer: Self-pay | Admitting: Psychiatry

## 2020-11-16 ENCOUNTER — Ambulatory Visit
Admission: EM | Admit: 2020-11-16 | Discharge: 2020-11-16 | Disposition: A | Discharge status: Home / Self Care | Payer: 59 | Attending: Emergency Medicine | Admitting: Emergency Medicine

## 2020-11-16 DIAGNOSIS — G43119 Migraine with aura, intractable, without status migrainosus: Secondary | ICD-10-CM

## 2020-11-16 MED ORDER — DIPHENHYDRAMINE HCL 50 MG/ML IJ SOLN
50.0000 mg | Freq: Once | INTRAMUSCULAR | Status: AC
  Administered 2020-11-16: 50 mg via INTRAMUSCULAR

## 2020-11-16 MED ORDER — METOCLOPRAMIDE HCL 5 MG/ML IJ SOLN
10.0000 mg | Freq: Once | INTRAMUSCULAR | Status: AC
  Administered 2020-11-16: 10 mg via INTRAMUSCULAR

## 2020-11-16 MED ORDER — KETOROLAC TROMETHAMINE 60 MG/2ML IM SOLN
60.0000 mg | Freq: Once | INTRAMUSCULAR | Status: AC
Start: 2020-11-16 — End: 2020-11-16
  Administered 2020-11-16: 60 mg via INTRAMUSCULAR

## 2020-11-16 NOTE — ED Provider Notes (Signed)
[facility]    CSN: [id number] Arrival date & time: 11/16/20  1222      History   Chief Complaint Chief Complaint  Patient presents with   Migraine    HPI Wendy Maynard is a 45 y.o. female.   New patient  Patient reports a history of migraine.  Patient states she has not had 1 in many years and is currently not taking any medication for abortive treatment or preventive treatment.  Patient states she was previously prescribed Relpax, states she has not found a new provider in the area as she is really [place].  Patient states that she was at work this morning, works primarily using a Animator, when she began to have small square boxes appear in her visual field then became incredibly nauseated and vomited.  After that she began to have severe pain behind her left eye.  States she is now incredibly sensitive to light and sound, states she is here for abortive treatment for migraine.  Patient denies loss of consciousness, altered mental status, diarrhea, fever, aches, chills, sinusitis, history of allergies, history of stroke, history of hypertension or heart disease.  Vital signs are stable on patient arrival today.  The history is provided by the patient.   Past Medical History:  Diagnosis Date   Abdominal pain in female patient 11/2012   Anxiety    Depression    Diabetes mellitus without complication (HCC)    Migraines    Polycystic ovarian disease     Patient Active Problem List   Diagnosis Date Noted   ADHD, predominantly inattentive type 02/06/2018   Posttraumatic stress disorder 02/06/2018   MDD (major depressive disorder), recurrent severe, without psychosis (HCC) 04/28/2016   Chronic calculous cholecystitis 03/23/2013   Cholecystitis 02/26/2013    Past Surgical History:  Procedure Laterality Date   CHOLECYSTECTOMY N/A 02/26/2013   Procedure: LAPAROSCOPIC CHOLECYSTECTOMY WITH INTRAOPERATIVE CHOLANGIOGRAM;  Surgeon: Adolph Pollack, MD;  Location: [facility];  Service: General;  Laterality: N/A;   HYSTERECTOMY ABDOMINAL WITH SALPINGECTOMY     WISDOM TOOTH EXTRACTION      OB History   No obstetric history on file.      Home Medications    Prior to Admission medications   Medication Sig Start Date End Date Taking? Authorizing Provider  ALPRAZolam Prudy Feeler) 0.5 MG tablet Take 1 tablet (0.5 mg total) by mouth 3 (three) times daily as needed for anxiety. 10/05/20   Corie Chiquito, PMHNP  amphetamine-dextroamphetamine (ADDERALL) 15 MG tablet Take 1 tablet by mouth 2 (two) times daily. 10/03/20 11/02/20  Corie Chiquito, PMHNP  amphetamine-dextroamphetamine (ADDERALL) 15 MG tablet Take 1 tablet by mouth 2 (two) times daily. 11/30/20 12/30/20  Corie Chiquito, PMHNP  amphetamine-dextroamphetamine (ADDERALL) 15 MG tablet Take 1 tablet by mouth 2 (two) times daily. 11/02/20 12/07/20  Corie Chiquito, PMHNP  ARIPiprazole (ABILIFY) 5 MG tablet Take 1/2-1 po qd 10/05/20   Corie Chiquito, PMHNP  Ascorbic Acid (VITAMIN C) 100 MG tablet Take 100 mg by mouth daily.    [provider]  cyclobenzaprine (FLEXERIL) 10 MG tablet Take 1 tablet by mouth 3 (three) times daily as needed. 06/26/20   [provider]  diclofenac (VOLTAREN) 75 MG EC tablet 2 (two) times daily as needed.  Patient not taking: No sig reported 07/14/18   [provider]  estradiol (ESTRACE) 1 MG tablet Take 1 tablet (1 mg total) by mouth daily. 05/03/16   Adonis Brook, NP  fexofenadine Joyce Copa)  60 MG tablet Take 60 mg by mouth 2 (two) times daily as needed.    [provider]  glycopyrrolate (ROBINUL) 1 MG tablet Take 1 mg by mouth 2 (two) times daily. 01/28/20   [provider]  sertraline (ZOLOFT) 100 MG tablet Take 1 tablet (100 mg total) by mouth daily. 10/05/20 01/03/21  Corie Chiquito, PMHNP  zolpidem (AMBIEN) 10 MG tablet Take 1 tablet (10 mg total) by mouth at bedtime as needed for sleep. 10/30/20   Corie Chiquito, PMHNP    Family History Family History  Problem Relation Age of Onset   Depression Mother        in response to grief/loss of husband   Hypertension Father        Died sudenly at age 61. Reports father may have had Aspergers   Depression Sister    Anxiety disorder Sister    ADD / ADHD Sister    ADD / ADHD Cousin    Asperger's syndrome Cousin     Social History Social History   Tobacco Use   Smoking status: Every Day    Packs/day: 0.50    Years: 8.00    Pack years: 4.00    Types: Cigarettes   Smokeless tobacco: Never   Tobacco comments:    plans to start nicotine gum  Substance Use Topics   Alcohol use: No    Comment: Described as episodic   Drug use: No    Frequency: 7.0 times per week    Types: Marijuana    Comment: Last use 04/27/16     Allergies   Ciprofloxacin, Clindamycin/lincomycin, Vicodin [hydrocodone-acetaminophen], Penicillins, and Sulfa antibiotics   Review of Systems Review of Systems Pertinent findings noted in history of present illness.    Physical Exam Triage Vital Signs ED Triage Vitals  Enc Vitals Group     BP      Pulse      Resp      Temp      Temp src      SpO2      Weight      Height      Head Circumference      Peak Flow      Pain Score      Pain Loc      Pain Edu?      Excl. in GC?    No data found.  Updated Vital Signs BP 125/89 (BP Location: Left Arm)   Pulse 96   Temp 98.5 F (36.9 C) (Oral)   Resp 18   Wt 270 lb 3.2 oz (122.6 kg)   LMP  (LMP Unknown)   SpO2 97%   BMI 42.32 kg/m   Visual Acuity Right Eye Distance:   Left Eye Distance:   Bilateral Distance:    Right Eye Near:   Left Eye Near:    Bilateral Near:     Physical Exam Vitals and nursing note reviewed.  Constitutional:      Appearance: Normal appearance.     Comments: Patient is sitting in a dark room with sunglasses on.  HENT:     Head: Normocephalic and atraumatic.     Right Ear: Tympanic membrane, ear canal and external ear  normal.     Left Ear: Tympanic membrane, ear canal and external ear normal.     Nose: Nose normal.     Mouth/Throat:     Mouth: Mucous membranes are moist.     Pharynx: Oropharynx is clear.  Eyes:  Extraocular Movements: Extraocular movements intact.     Conjunctiva/sclera: Conjunctivae normal.     Pupils: Pupils are equal, round, and reactive to light.  Cardiovascular:     Rate and Rhythm: Normal rate and regular rhythm.     Pulses: Normal pulses.     Heart sounds: Normal heart sounds.  Pulmonary:     Effort: Pulmonary effort is normal.     Breath sounds: Normal breath sounds.  Musculoskeletal:        General: Normal range of motion.     Cervical back: Normal range of motion and neck supple.  Skin:    General: Skin is warm and dry.  Neurological:     General: No focal deficit present.     Mental Status: She is alert and oriented to person, place, and time. Mental status is at baseline.     Cranial Nerves: No cranial nerve deficit.     Sensory: No sensory deficit.     Motor: No weakness.     Coordination: Coordination normal.     Gait: Gait normal.     Deep Tendon Reflexes: Reflexes normal.  Psychiatric:        Mood and Affect: Mood normal.        Behavior: Behavior normal.     UC Treatments / Results  Labs (all labs ordered are listed, but only abnormal results are displayed) Labs Reviewed - No data to display  EKG   Radiology No results found.  Procedures Procedures (including critical care time)  Medications Ordered in UC Medications  ketorolac (TORADOL) injection 60 mg (has no administration in time range)  metoCLOPramide (REGLAN) injection 10 mg (has no administration in time range)  diphenhydrAMINE (BENADRYL) injection 50 mg (has no administration in time range)    Initial Impression / Assessment and Plan / UC Course  I have reviewed the triage vital signs and the nursing notes.  Pertinent labs & imaging results that were available during my care  of the patient were reviewed by me and considered in my medical decision making (see chart for details).     As patient has requested, I agree that abortive migraine treatment is indicated based on the history she is provided and my physical exam findings today.  Patient was provided with ketorolac, Reglan and Benadryl injections.  Patient states she lives 2 minutes away from clinic, she was discharged immediately after her injections.Patient verbalized understanding and agreement of plan as discussed.  All questions were addressed during visit.  Please see discharge instructions below for further details of plan.  Final Clinical Impressions(s) / UC Diagnoses   Final diagnoses:  Intractable migraine with aura without status migrainosus     Discharge Instructions      You have been provided with injections of ketorolac, Reglan and Benadryl.  As we discussed, please go straight home after discharging from our clinic.  Please reach out to [place] if you do not have complete resolution of your migraine symptoms.     ED Prescriptions   None    PDMP not reviewed this encounter.   Theadora Rama Scales, PA-C 11/16/20 1325

## 2020-11-16 NOTE — Discharge Instructions (Signed)
You have been provided with injections of ketorolac, Reglan and Benadryl.  As we discussed, please go straight home after discharging from our clinic.  Please reach out to [place] if you do not have complete resolution of your migraine symptoms.

## 2020-11-16 NOTE — ED Triage Notes (Signed)
Patient reports having a migraine that started today about an hour ago. Patient reports having light sensitively.

## 2020-11-20 ENCOUNTER — Other Ambulatory Visit (HOSPITAL_BASED_OUTPATIENT_CLINIC_OR_DEPARTMENT_OTHER): Payer: Self-pay

## 2020-11-20 ENCOUNTER — Other Ambulatory Visit: Payer: Self-pay | Admitting: Psychiatry

## 2020-11-20 DIAGNOSIS — F5101 Primary insomnia: Secondary | ICD-10-CM

## 2020-11-20 MED ORDER — ZOLPIDEM TARTRATE 10 MG PO TABS
10.0000 mg | ORAL_TABLET | Freq: Every evening | ORAL | 0 refills | Status: DC | PRN
Start: 2020-11-20 — End: 2020-12-14
  Filled 2020-11-20: qty 30, 30d supply, fill #0

## 2020-11-20 MED ORDER — CLINDAMYCIN HCL 300 MG PO CAPS
ORAL_CAPSULE | ORAL | 0 refills | Status: DC
  Filled 2020-11-20: qty 21, 7d supply, fill #0

## 2020-11-20 NOTE — Telephone Encounter (Signed)
Patient called in for refill on Ambien 10mg . Ph: [phone]. Appt 11/3. Pharmacy [facility] [address] [place]

## 2020-11-20 NOTE — Telephone Encounter (Signed)
Last filled 9/12 appt on 11/3

## 2020-11-29 ENCOUNTER — Other Ambulatory Visit: Payer: Self-pay

## 2020-11-29 ENCOUNTER — Ambulatory Visit (INDEPENDENT_AMBULATORY_CARE_PROVIDER_SITE_OTHER): Payer: 59 | Admitting: Psychiatry

## 2020-11-29 DIAGNOSIS — F411 Generalized anxiety disorder: Secondary | ICD-10-CM
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
## 2020-12-07 ENCOUNTER — Other Ambulatory Visit: Payer: Self-pay

## 2020-12-07 ENCOUNTER — Ambulatory Visit (HOSPITAL_BASED_OUTPATIENT_CLINIC_OR_DEPARTMENT_OTHER): Payer: 59 | Attending: Psychiatry | Admitting: Internal Medicine

## 2020-12-07 ENCOUNTER — Other Ambulatory Visit (HOSPITAL_BASED_OUTPATIENT_CLINIC_OR_DEPARTMENT_OTHER): Payer: Self-pay

## 2020-12-07 DIAGNOSIS — G4709 Other insomnia: Secondary | ICD-10-CM

## 2020-12-07 DIAGNOSIS — R0683 Snoring: Secondary | ICD-10-CM | POA: Diagnosis present

## 2020-12-07 DIAGNOSIS — G4733 Obstructive sleep apnea (adult) (pediatric): Secondary | ICD-10-CM | POA: Insufficient documentation

## 2020-12-07 DIAGNOSIS — G4700 Insomnia, unspecified: Secondary | ICD-10-CM | POA: Insufficient documentation

## 2020-12-07 DIAGNOSIS — G4753 Recurrent isolated sleep paralysis: Secondary | ICD-10-CM | POA: Insufficient documentation

## 2020-12-07 DIAGNOSIS — I493 Ventricular premature depolarization: Secondary | ICD-10-CM | POA: Insufficient documentation

## 2020-12-07 DIAGNOSIS — R5383 Other fatigue: Secondary | ICD-10-CM | POA: Insufficient documentation

## 2020-12-07 DIAGNOSIS — G4710 Hypersomnia, unspecified: Secondary | ICD-10-CM | POA: Insufficient documentation

## 2020-12-07 DIAGNOSIS — G478 Other sleep disorders: Secondary | ICD-10-CM | POA: Insufficient documentation

## 2020-12-10 DIAGNOSIS — R0683 Snoring: Secondary | ICD-10-CM

## 2020-12-10 NOTE — Procedures (Signed)
   Patient Name: Wendy Maynard, Wendy Maynard Date: 12/07/2020 Gender: Female D.O.B: [birth year] Age (years): 45 Referring Provider: Corie Chiquito PMHNP Height (inches): 67 Interpreting Physician: Jetty Duhamel MD, ABSM Weight (lbs): 267 RPSGT: Shelah Lewandowsky BMI: 42 MRN: [id number] Neck Size: 17.00  CLINICAL INFORMATION Sleep Study Type: NPSG Indication for sleep study: Fatigue, Insomnia, Obesity, Parasomnias, Snoring Epworth Sleepiness Score: 15  SLEEP STUDY TECHNIQUE As per the AASM Manual for the Scoring of Sleep and Associated Events v2.3 (April 2016) with a hypopnea requiring 4% desaturations.  The channels recorded and monitored were frontal, central and occipital EEG, electrooculogram (EOG), submentalis EMG (chin), nasal and oral airflow, thoracic and abdominal wall motion, anterior tibialis EMG, snore microphone, electrocardiogram, and pulse oximetry.  MEDICATIONS Medications self-administered by patient taken the night of the study : AMBIEN, ABILIFY, GLYCOPYRROLATE, ESTRADIOL, SERTRALINE  SLEEP ARCHITECTURE The study was initiated at 10:05:07 PM and ended at 4:37:36 AM.  Sleep onset time was 24.7 minutes and the sleep efficiency was 84.3%%. The total sleep time was 331 minutes.  Stage REM latency was 232.0 minutes.  The patient spent 14.2%% of the night in stage N1 sleep, 78.1%% in stage N2 sleep, 0.3%% in stage N3 and 7.4% in REM.  Alpha intrusion was absent.  Supine sleep was 19.79%.  RESPIRATORY PARAMETERS The overall apnea/hypopnea index (AHI) was 33.9 per hour. There were 4 total apneas, including 0 obstructive, 4 central and 0 mixed apneas. There were 183 hypopneas and 52 RERAs.  The AHI during Stage REM sleep was 107.8 per hour.  AHI while supine was 72.4 per hour.  The mean oxygen saturation was 91.7%. The minimum SpO2 during sleep was 74.0%.  moderate snoring was noted during this study.  CARDIAC DATA The 2 lead EKG demonstrated sinus rhythm. The mean  heart rate was 78.1 beats per minute. Other EKG findings include: PVCs.  LEG MOVEMENT DATA The total PLMS were 0 with a resulting PLMS index of 0.0. Associated arousal with leg movement index was 0.7 .  IMPRESSIONS - Severe obstructive sleep apnea occurred during this study (AHI = 33.9/h). Most events were hypopneas. - No significant central sleep apnea occurred during this study (CAI = 0.7/h). - Oxygen desaturation was noted during this study (Min O2 = 74.0%). Mean O2 saturation 91.7%. - The patient snored with moderate snoring volume. - EKG findings include PVCs. - Clinically significant periodic limb movements did not occur during sleep. No significant associated arousals. - Some bruxism and sleep-talking noted.  DIAGNOSIS - Obstructive Sleep Apnea (G47.33) - Bruxism (G47.63)  RECOMMENDATIONS - Suggest CPAP titration sleep study or autopap. Other options would be based on clinical judgment,. - Consider oral bite guard for bruxism - Be careful with alcohol, sedatives and other CNS depressants that may worsen sleep apnea and disrupt normal sleep architecture. - Sleep hygiene should be reviewed to assess factors that may improve sleep quality. - Weight management and regular exercise should be initiated or continued if appropriate.  [Electronically signed] 12/10/2020 10:27 AM  Jetty Duhamel MD, ABSM Diplomate, American Board of Sleep Medicine   NPI: [id number]                          Jetty Duhamel Diplomate, American Board of Sleep Medicine  ELECTRONICALLY SIGNED ON:  12/10/2020, 10:22 AM  [facility] PH: [phone]   FX: [phone] ACCREDITED BY THE AMERICAN ACADEMY OF SLEEP MEDICINE

## 2020-12-13 ENCOUNTER — Ambulatory Visit: Payer: Self-pay | Admitting: Psychiatry

## 2020-12-14 ENCOUNTER — Other Ambulatory Visit (HOSPITAL_BASED_OUTPATIENT_CLINIC_OR_DEPARTMENT_OTHER): Payer: Self-pay

## 2020-12-14 ENCOUNTER — Telehealth (INDEPENDENT_AMBULATORY_CARE_PROVIDER_SITE_OTHER): Payer: 59 | Admitting: Psychiatry

## 2020-12-14 ENCOUNTER — Encounter: Payer: Self-pay | Admitting: Psychiatry

## 2020-12-14 DIAGNOSIS — F5101 Primary insomnia: Secondary | ICD-10-CM | POA: Diagnosis not present

## 2020-12-14 DIAGNOSIS — F4310 Post-traumatic stress disorder, unspecified: Secondary | ICD-10-CM | POA: Diagnosis not present

## 2020-12-14 DIAGNOSIS — F3342 Major depressive disorder, recurrent, in full remission: Secondary | ICD-10-CM | POA: Diagnosis not present

## 2020-12-14 DIAGNOSIS — F900 Attention-deficit hyperactivity disorder, predominantly inattentive type: Secondary | ICD-10-CM

## 2020-12-14 MED ORDER — AMPHETAMINE-DEXTROAMPHETAMINE 15 MG PO TABS
15.0000 mg | ORAL_TABLET | Freq: Two times a day (BID) | ORAL | 0 refills | Status: DC
Start: 2021-03-01 — End: 2021-04-10
  Filled 2021-03-12: qty 60, 30d supply, fill #0

## 2020-12-14 MED ORDER — AMPHETAMINE-DEXTROAMPHETAMINE 15 MG PO TABS
15.0000 mg | ORAL_TABLET | Freq: Two times a day (BID) | ORAL | 0 refills | Status: DC
  Filled 2021-01-08: qty 60, 30d supply, fill #0

## 2020-12-14 MED ORDER — ALPRAZOLAM 0.5 MG PO TABS
0.5000 mg | ORAL_TABLET | Freq: Three times a day (TID) | ORAL | 1 refills | Status: DC | PRN
  Filled 2020-12-14 – 2020-12-15 (×2): qty 90, 30d supply, fill #0
  Filled 2021-01-17: qty 90, 30d supply, fill #1

## 2020-12-14 MED ORDER — ZOLPIDEM TARTRATE 10 MG PO TABS
10.0000 mg | ORAL_TABLET | Freq: Every evening | ORAL | 0 refills | Status: DC | PRN
  Filled 2020-12-14 – 2020-12-18 (×2): qty 30, 30d supply, fill #0

## 2020-12-14 MED ORDER — SERTRALINE HCL 100 MG PO TABS
100.0000 mg | ORAL_TABLET | Freq: Every day | ORAL | 1 refills | Status: DC
  Filled 2020-12-14: qty 90, 90d supply, fill #0

## 2020-12-14 MED ORDER — ARIPIPRAZOLE 5 MG PO TABS
ORAL_TABLET | ORAL | 1 refills | Status: DC
  Filled 2020-12-14: qty 90, fill #0
  Filled 2021-03-12: qty 90, 90d supply, fill #0

## 2020-12-14 MED ORDER — AMPHETAMINE-DEXTROAMPHETAMINE 15 MG PO TABS
15.0000 mg | ORAL_TABLET | Freq: Two times a day (BID) | ORAL | 0 refills | Status: DC
  Filled 2021-02-09: qty 60, 30d supply, fill #0

## 2020-12-14 NOTE — Progress Notes (Signed)
Wendy Maynard [id number] 13-Jun-1975 45 y.o.  Virtual Visit via Video Note  I connected with pt @ on 12/14/20 at  1:30 PM EDT by a video enabled telemedicine application and verified that I am speaking with the correct person using two identifiers.   I discussed the limitations of evaluation and management by telemedicine and the availability of in person appointments. The patient expressed understanding and agreed to proceed.  I discussed the assessment and treatment plan with the patient. The patient was provided an opportunity to ask questions and all were answered. The patient agreed with the plan and demonstrated an understanding of the instructions.   The patient was advised to call back or seek an in-person evaluation if the symptoms worsen or if the condition fails to improve as anticipated.  I provided 30 minutes of non-face-to-face time during this encounter.  The patient was located at home.  The provider was located at [facility].   Corie Chiquito, PMHNP   Subjective:   Patient ID:  Wendy Maynard is a 45 y.o. (DOB Oct 29, 1975) female.  Chief Complaint:  Chief Complaint  Patient presents with   Follow-up    Anxiety, depression, and ADHD    HPI [name] presents for follow-up of anxiety, depression, and ADHD. New job is continuing to go well. She reports "a little bit of anxiety" in response to intense work in therapy and and adjusting to various changes. She reports that she has a feeling "that the other shoe is going to drop." She reports that therapy has been helpful. She had some anxiety in anticipation of sleep study which indicated Sleep Apnea. She reports that she has had some mild panic s/s. She reports that she is sleeping better at night. She is avoiding caffeine at night. Denies depressed mood. Appetite has been good. She reports that her energy and motivation have been good. Concentration has been good and is able to sustain focus. Denies SI.   She reports  occ xanax prn use with last use a few months ago. Taking Abilify 5 mg po qd- "I think I need it right now." She reports Abilify has been helpful for rumination and "keeps me steady."   Has been thinking about trying to stop smoking in the new year.   Reports weight has been stable overall.   Review of Systems:  Review of Systems  Cardiovascular:  Negative for palpitations.  Musculoskeletal:  Negative for gait problem.  Psychiatric/Behavioral:         Please refer to HPI   Medications: I have reviewed the patient's current medications.  Current Outpatient Medications  Medication Sig Dispense Refill   ALPRAZolam (XANAX) 0.5 MG tablet Take 1 tablet (0.5 mg total) by mouth 3 (three) times daily as needed for anxiety. 90 tablet 1   [START ON 03/01/2021] amphetamine-dextroamphetamine (ADDERALL) 15 MG tablet Take 1 tablet by mouth 2 (two) times daily. 60 tablet 0   [START ON 02/01/2021] amphetamine-dextroamphetamine (ADDERALL) 15 MG tablet Take 1 tablet by mouth 2 (two) times daily. 60 tablet 0   [START ON 01/04/2021] amphetamine-dextroamphetamine (ADDERALL) 15 MG tablet Take 1 tablet by mouth 2 (two) times daily. 60 tablet 0   ARIPiprazole (ABILIFY) 5 MG tablet Take 1/2-1 po qd 90 tablet 1   Ascorbic Acid (VITAMIN C) 100 MG tablet Take 100 mg by mouth daily.     clindamycin (CLEOCIN) 300 MG capsule Take 1 capsule by mouth every 8 hours for 7 days. (Patient not taking: Reported on 12/14/2020) 21 capsule  0   cyclobenzaprine (FLEXERIL) 10 MG tablet Take 1 tablet by mouth 3 (three) times daily as needed.     diclofenac (VOLTAREN) 75 MG EC tablet 2 (two) times daily as needed.  (Patient not taking: No sig reported)     estradiol (ESTRACE) 1 MG tablet Take 1 tablet (1 mg total) by mouth daily. 30 tablet 0   fexofenadine (ALLEGRA) 60 MG tablet Take 60 mg by mouth 2 (two) times daily as needed.     glycopyrrolate (ROBINUL) 1 MG tablet Take 1 mg by mouth 2 (two) times daily.     sertraline (ZOLOFT) 100  MG tablet Take 1 tablet (100 mg total) by mouth daily. 90 tablet 1   [START ON 12/18/2020] zolpidem (AMBIEN) 10 MG tablet Take 1 tablet (10 mg total) by mouth at bedtime as needed for sleep. 30 tablet 0   No current facility-administered medications for this visit.    Medication Side Effects: None  Allergies:  Allergies  Allergen Reactions   Ciprofloxacin Hives and Other (See Comments)    Started in arm as soon as drug started.   Clindamycin/Lincomycin Diarrhea and Other (See Comments)    Causes excessive diarrhea   Vicodin [Hydrocodone-Acetaminophen] Itching and Nausea And Vomiting   Penicillins Rash and Other (See Comments)    Occurred at 45 years old Has patient had a PCN reaction causing immediate rash, facial/tongue/throat swelling, SOB or lightheadedness with hypotension: yes Has patient had a PCN reaction causing severe rash involving mucus membranes or skin necrosis: no Has patient had a PCN reaction that required hospitalization no Has patient had a PCN reaction occurring within the last 10 years:no If all of the above answers are "NO", then may proceed with Cephalosporin use.    Sulfa Antibiotics Rash    Past Medical History:  Diagnosis Date   Abdominal pain in female patient 11/2012   Anxiety    Depression    Diabetes mellitus without complication (HCC)    Migraines    Polycystic ovarian disease     Family History  Problem Relation Age of Onset   Depression Mother        in response to grief/loss of husband   Hypertension Father        Died sudenly at age 55. Reports father may have had Aspergers   Depression Sister    Anxiety disorder Sister    ADD / ADHD Sister    ADD / ADHD Cousin    Asperger's syndrome Cousin     Social History   Socioeconomic History   Marital status: Single    Spouse name: Not on file   Number of children: Not on file   Years of education: Not on file   Highest education level: Not on file  Occupational History   Not on file   Tobacco Use   Smoking status: Every Day    Packs/day: 0.50    Years: 8.00    Pack years: 4.00    Types: Cigarettes   Smokeless tobacco: Never   Tobacco comments:    plans to start nicotine gum  Substance and Sexual Activity   Alcohol use: No    Comment: Described as episodic   Drug use: No    Frequency: 7.0 times per week    Types: Marijuana    Comment: Last use 04/27/16   Sexual activity: Not Currently    Birth control/protection: Condom  Other Topics Concern   Not on file  Social History Narrative   Not  on file   Social Determinants of Health   Financial Resource Strain: Not on file  Food Insecurity: Not on file  Transportation Needs: Not on file  Physical Activity: Not on file  Stress: Not on file  Social Connections: Not on file  Intimate Partner Violence: Not on file    Past Medical History, Surgical history, Social history, and Family history were reviewed and updated as appropriate.   Please see review of systems for further details on the patient's review from today.   Objective:   Physical Exam:  Wt 267 lb (121.1 kg)   LMP  (LMP Unknown)   BMI 41.82 kg/m   Physical Exam Neurological:     Mental Status: She is alert and oriented to person, place, and time.     Cranial Nerves: No dysarthria.  Psychiatric:        Attention and Perception: Attention and perception normal.        Speech: Speech normal.        Behavior: Behavior is cooperative.        Thought Content: Thought content normal. Thought content is not paranoid or delusional. Thought content does not include homicidal or suicidal ideation. Thought content does not include homicidal or suicidal plan.        Cognition and Memory: Cognition and memory normal.        Judgment: Judgment normal.     Comments: Insight intact Mood is appropriate to content. Affect is congruent.    Lab Review:     Component Value Date/Time   NA 141 04/28/2016 1836   K 3.7 04/28/2016 1836   CL 107 04/28/2016  1836   CO2 25 04/28/2016 1836   GLUCOSE 90 04/28/2016 1836   BUN 14 04/28/2016 1836   CREATININE 0.68 04/28/2016 1836   CALCIUM 9.5 04/28/2016 1836   PROT 8.3 (H) 04/28/2016 1836   ALBUMIN 4.6 04/28/2016 1836   AST 21 04/28/2016 1836   ALT 18 04/28/2016 1836   ALKPHOS 67 04/28/2016 1836   BILITOT 0.5 04/28/2016 1836   GFRNONAA >60 04/28/2016 1836   GFRAA >60 04/28/2016 1836       Component Value Date/Time   WBC 9.4 04/28/2016 1836   RBC 4.61 04/28/2016 1836   HGB 12.5 04/28/2016 1836   HCT 37.7 04/28/2016 1836   PLT 272 04/28/2016 1836   MCV 81.8 04/28/2016 1836   MCV 89.0 02/10/2013 1721   MCH 27.1 04/28/2016 1836   MCHC 33.2 04/28/2016 1836   RDW 13.8 04/28/2016 1836   LYMPHSABS 2.1 02/26/2013 0154   MONOABS 0.6 02/26/2013 0154   EOSABS 0.2 02/26/2013 0154   BASOSABS 0.0 02/26/2013 0154    No results found for: POCLITH, LITHIUM   No results found for: PHENYTOIN, PHENOBARB, VALPROATE, CBMZ   .res Assessment: Plan:   Pt seen for 30 minutes and time spent reviewing recommendations from sleep specialist. Discussed that cPap was recommended and will therefore initiate order for cPap. Discussed goal of decreasing/discontinuing Ambien in the future if possible. She reports that she will also discuss night guard with dentist for bruxism.  Continue Adderall 15 mg po BID for ADHD.  Continue Abilify 5 mg po qd for depression.  Continue Sertraline 100 mg po qd for mood and anxiety.  Continue xanax as needed for anxiety.  Recommend continuing therapy with Rockne Menghini, LCSW.  Pt to follow-up in 3 months or sooner if clinically indicated.  Patient advised to contact office with any questions, adverse effects, or acute worsening  in signs and symptoms.    Alailah was seen today for follow-up.  Diagnoses and all orders for this visit:  Attention deficit hyperactivity disorder (ADHD), predominantly inattentive type -     amphetamine-dextroamphetamine (ADDERALL) 15 MG tablet; Take 1  tablet by mouth 2 (two) times daily. -     amphetamine-dextroamphetamine (ADDERALL) 15 MG tablet; Take 1 tablet by mouth 2 (two) times daily. -     amphetamine-dextroamphetamine (ADDERALL) 15 MG tablet; Take 1 tablet by mouth 2 (two) times daily.  Recurrent major depressive disorder, in full remission (HCC) -     ARIPiprazole (ABILIFY) 5 MG tablet; Take 1/2-1 po qd -     sertraline (ZOLOFT) 100 MG tablet; Take 1 tablet (100 mg total) by mouth daily.  Posttraumatic stress disorder -     ALPRAZolam (XANAX) 0.5 MG tablet; Take 1 tablet (0.5 mg total) by mouth 3 (three) times daily as needed for anxiety. -     sertraline (ZOLOFT) 100 MG tablet; Take 1 tablet (100 mg total) by mouth daily.  Primary insomnia -     zolpidem (AMBIEN) 10 MG tablet; Take 1 tablet (10 mg total) by mouth at bedtime as needed for sleep.    Please see After Visit Summary for patient specific instructions.  Future Appointments  Date Time Provider Department Center  01/01/2021  5:00 PM Mathis Fare, LCSW [facility] None  01/16/2021  5:00 PM Mathis Fare, LCSW [facility] None  01/30/2021  5:00 PM Mathis Fare, LCSW [facility] None    No orders of the defined types were placed in this encounter.     -------------------------------

## 2020-12-15 ENCOUNTER — Other Ambulatory Visit (HOSPITAL_BASED_OUTPATIENT_CLINIC_OR_DEPARTMENT_OTHER): Payer: Self-pay

## 2020-12-18 ENCOUNTER — Other Ambulatory Visit (HOSPITAL_BASED_OUTPATIENT_CLINIC_OR_DEPARTMENT_OTHER): Payer: Self-pay

## 2020-12-19 ENCOUNTER — Telehealth: Payer: Self-pay | Admitting: Psychiatry

## 2020-12-19 NOTE — Telephone Encounter (Signed)
Noted! Thank you

## 2020-12-19 NOTE — Telephone Encounter (Signed)
Order for CPAP and supplies was faxed to [facility].  Patient notified that they will reach out to her.

## 2021-01-01 ENCOUNTER — Other Ambulatory Visit: Payer: Self-pay

## 2021-01-01 ENCOUNTER — Ambulatory Visit (INDEPENDENT_AMBULATORY_CARE_PROVIDER_SITE_OTHER): Payer: 59 | Admitting: Psychiatry

## 2021-01-01 DIAGNOSIS — F411 Generalized anxiety disorder: Secondary | ICD-10-CM
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
## 2021-01-08 ENCOUNTER — Other Ambulatory Visit (HOSPITAL_BASED_OUTPATIENT_CLINIC_OR_DEPARTMENT_OTHER): Payer: Self-pay

## 2021-01-08 ENCOUNTER — Other Ambulatory Visit: Payer: Self-pay | Admitting: Psychiatry

## 2021-01-08 DIAGNOSIS — F5101 Primary insomnia: Secondary | ICD-10-CM

## 2021-01-16 ENCOUNTER — Ambulatory Visit (INDEPENDENT_AMBULATORY_CARE_PROVIDER_SITE_OTHER): Payer: 59 | Admitting: Psychiatry

## 2021-01-16 ENCOUNTER — Other Ambulatory Visit: Payer: Self-pay | Admitting: Psychiatry

## 2021-01-16 ENCOUNTER — Other Ambulatory Visit: Payer: Self-pay

## 2021-01-16 DIAGNOSIS — F5101 Primary insomnia: Secondary | ICD-10-CM

## 2021-01-16 DIAGNOSIS — F411 Generalized anxiety disorder: Secondary | ICD-10-CM

## 2021-01-16 NOTE — Progress Notes (Signed)
[facility] Counselor/Therapist Progress Note  Patient ID: Wendy Maynard, MRN: [id number],    Date: 01/16/2021  Time Spent: 57 minutes   Treatment Type: Individual Therapy  Reported Symptoms: anxious, sad (some due to holidays), still fairly new in job and having anxiety  Mental Status Exam:  Appearance:   Neat     Behavior:  Appropriate, Sharing, and Motivated  Motor:  Normal  Speech/Language:   Clear and Coherent  Affect:  Anxious, some sadness (holiday related and it's a hard time of year for me.)  Mood:  anxious and sad  Thought process:  goal directed  Thought content:    WNL  Sensory/Perceptual disturbances:    WNL  Orientation:  oriented to person, place, time/date, situation, day of week, month of year, year, and stated date of Dec. 6, 2022  Attention:  Good  Concentration:  Good  Memory:  WNL  Fund of knowledge:   Good  Insight:    Good  Judgment:   Good  Impulse Control:  Good   Risk Assessment: Danger to Self:  No Self-injurious Behavior: No Danger to Others: No Duty to Warn:no Physical Aggression / Violence:No  Access to Firearms a concern: No  Gang Involvement:No   Subjective: Patient in today reporting anxiety and sadness that is mostly "related to holidays and family losses making the season more sad." Realizing she's setting limits with people in her life to have clear boundaries with those who have not been healthy for patient. Processing her anxiety re: personal and job issues. Also working on her sadness during this pre-holiday time that is related to personal and family losses. "Am getting more selective as to how and with whom I share my time." Feeling more anxious in the a.m. when she wakes up and spends some time with her coffee and dog and that seems to help. Is looking forward to getting her CPAP machine and hopefully sleeping better. Seemed to help patient to talk through these concerns and seemed more calm and grounded by session end. Still  journaling and plans to start some crafting as she feels that could be therapeutic for her. Tries to keep relationship with sister, even though sister will not interact/speak with mom. Patient, despite also having some issues with mom, is able to still keep current relationship with mother and set limits as needed. Does feel that mom sometimes contributes to her emotional dysregulation at times. Continues to work on forgiveness in her relationship with mother. Sadness and dread of holidays and looked at some coping skills for patient to feel more prepared to get through the holidays. Also continues to build trust in her family and with co-workers. Trying to "decrease my worrying and fear of losing things and people, realizing that her worrying doesn't change anything and I worry about some things that will probably never happen."  Seeing more of how "worrying keeps her from experiencing more positives in life."  Interventions: Solution-Oriented/Positive Psychology, Ego-Supportive, and Insight-Oriented  Treatment Goal Plan: Patient not signing treatment plan on computer screen due to Covid. Treatment Goals: Treatment goals remain on treatment as patient works with strategies to achieve her goals.  Progress is evaluated each session and documented in the "progress" section of note. Long term goal: Stabilize anxiety level while increasing ability to function on a daily basis. Short term goal: Identify major life conflicts from the past and present that form the basis for present anxiety. Strategies: Reinforced client's insights into the role of her past  emotional pain and present anxiety.  Support patient's work in further resolving these issues   Diagnosis:   ICD-10-CM   1. Generalized anxiety disorder  F41.1       Plan:  Patient today showing good motivation and participation in session as she worked on better anxiety management, better understanding her worrying and trying to decrease it,  decrease fearful/negative thoughts re: some relationships she is working on having some healing. Looking for more positives and look at what I have versus what I don't have. Encouraged patient in her practice of positive behavior including: Believing in herself more and her ability to make changes, getting outside daily and walking some as she is able, continuing her journaling between sessions, having some time each day just for herself, practice consistent positive self talk and self-care, interrupting negative/anxious thoughts and challenge/replace with more encouraging and empowering thoughts, stay in touch with people that are supportive and healthy for her, interacting more with coworkers in positive ways, staying in the present and focusing on what she can control, looking for what might go right versus wrong, looking for things that give her hope, intentionally looking for more positives versus negatives daily, searching for the positives within herself, good nutrition and exercise, allowing for good sleep patterns, follow through on getting her CPAP machine which she has now learned will be near the end of the year or early part of next year, and recognize the strengths she shows working with goal-directed behaviors to move in a direction that supports improved emotional health  Goal review and progress/challenges noted with patient.  Next appointment within 2 weeks.  This record has been created using [employer].  Chart creation errors have been sought, but may not always have been located and corrected.  Such creation errors do not reflect on the standard of medical care provided.    Mathis Fare, LCSW

## 2021-01-16 NOTE — Telephone Encounter (Signed)
Last filled 11/7

## 2021-01-17 ENCOUNTER — Other Ambulatory Visit (HOSPITAL_BASED_OUTPATIENT_CLINIC_OR_DEPARTMENT_OTHER): Payer: Self-pay

## 2021-01-17 MED ORDER — ZOLPIDEM TARTRATE 10 MG PO TABS
10.0000 mg | ORAL_TABLET | Freq: Every evening | ORAL | 0 refills | Status: DC | PRN
  Filled 2021-01-17: qty 30, 30d supply, fill #0

## 2021-01-30 ENCOUNTER — Ambulatory Visit (INDEPENDENT_AMBULATORY_CARE_PROVIDER_SITE_OTHER): Payer: 59 | Admitting: Psychiatry

## 2021-01-30 ENCOUNTER — Other Ambulatory Visit: Payer: Self-pay

## 2021-01-30 DIAGNOSIS — F411 Generalized anxiety disorder: Secondary | ICD-10-CM | POA: Diagnosis not present
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
## 2021-02-09 ENCOUNTER — Other Ambulatory Visit (HOSPITAL_BASED_OUTPATIENT_CLINIC_OR_DEPARTMENT_OTHER): Payer: Self-pay

## 2021-02-14 ENCOUNTER — Other Ambulatory Visit (HOSPITAL_BASED_OUTPATIENT_CLINIC_OR_DEPARTMENT_OTHER): Payer: Self-pay

## 2021-02-15 ENCOUNTER — Other Ambulatory Visit: Payer: Self-pay | Admitting: Psychiatry

## 2021-02-15 DIAGNOSIS — F5101 Primary insomnia: Secondary | ICD-10-CM

## 2021-02-16 ENCOUNTER — Other Ambulatory Visit (HOSPITAL_BASED_OUTPATIENT_CLINIC_OR_DEPARTMENT_OTHER): Payer: Self-pay

## 2021-02-16 ENCOUNTER — Other Ambulatory Visit: Payer: Self-pay | Admitting: Psychiatry

## 2021-02-16 DIAGNOSIS — F5101 Primary insomnia: Secondary | ICD-10-CM

## 2021-02-16 MED ORDER — ZOLPIDEM TARTRATE 10 MG PO TABS
10.0000 mg | ORAL_TABLET | Freq: Every evening | ORAL | 5 refills | Status: DC | PRN
  Filled 2021-02-16: qty 30, 30d supply, fill #0
  Filled 2021-03-18: qty 30, 30d supply, fill #1
  Filled 2021-04-16: qty 30, 30d supply, fill #2
  Filled 2021-05-17: qty 24, 24d supply, fill #3
  Filled 2021-05-17: qty 6, 6d supply, fill #3
  Filled 2021-06-11 (×2): qty 30, 30d supply, fill #4
  Filled 2021-07-05: qty 30, 30d supply, fill #5

## 2021-02-22 ENCOUNTER — Other Ambulatory Visit: Payer: Self-pay

## 2021-02-22 ENCOUNTER — Ambulatory Visit (INDEPENDENT_AMBULATORY_CARE_PROVIDER_SITE_OTHER): Payer: 59 | Admitting: Psychiatry

## 2021-02-22 DIAGNOSIS — F411 Generalized anxiety disorder: Secondary | ICD-10-CM | POA: Diagnosis not present

## 2021-02-22 NOTE — Progress Notes (Signed)
[facility] Counselor/Therapist Progress Note  Patient ID: Wendy Maynard, MRN: [id number],    Date: 02/22/2021  Time Spent: 55 minutes   Treatment Type: Individual Therapy  Reported Symptoms: anxiety, stressed  (personal and work)  Mental Status Exam:  Appearance:   Casual     Behavior:  Appropriate, Sharing, and Motivated  Motor:  Normal  Speech/Language:   Clear and Coherent  Affect:  anxious  Mood:  anxious  Thought process:  goal directed  Thought content:    WNL  Sensory/Perceptual disturbances:    WNL  Orientation:  oriented to person, place, time/date, situation, day of week, month of year, year, and stated date of Jan. 12, 2023  Attention:  Good  Concentration:  Good  Memory:  WNL  Fund of knowledge:   Good  Insight:    Good  Judgment:   Good  Impulse Control:  Good   Risk Assessment: Danger to Self:  No Self-injurious Behavior: No Danger to Others: No Duty to Warn:no Physical Aggression / Violence:No  Access to Firearms a concern: No  Gang Involvement:No   Subjective: Patient in today reporting anxiety mostly related to personal, family issues, and financial stress, work stress. Living in apt with mother who has a lot of expectations and sometimes demands. Doesn't want to be "an extension of mom" and discussed this more in session today. Acknowledges and still grieving her dad's death 4 yrs ago and processed this more with patient today especially looking as it has "come up more recently" and how that relates to patient sometimes with dad deceased patient reports clinging to mom and not having healthier limits and boundaries.  Has used some DBT skills with benefit. Made new friend at work and they have lunch together often. Continued emphasis on forgiving mom and others. Understands the destructiveness of worrying and trying to worry less at this point and not focus on worst case scenarios.  Shared some of her journaling between sessions relating to family,  including mom. Patient successfully stopped smoking 11 days ago and sticking with it so far. Using some craft projects that helps in self-calming and soothing and she feels helping her with not smoking.  Interventions: Solution-Oriented/Positive Psychology, Ego-Supportive, and Insight-Oriented   Treatment Goal Plan: Patient not signing treatment plan on computer screen due to Covid. Treatment Goals: Treatment goals remain on treatment as patient works with strategies to achieve her goals.  Progress is evaluated each session and documented in the "progress" section of note. Long term goal: Stabilize anxiety level while increasing ability to function on a daily basis. Short term goal: Identify major life conflicts from the past and present that form the basis for present anxiety. Strategies: Reinforced client's insights into the role of her past emotional pain and present anxiety.  Support patient's work in further resolving these issues  Diagnosis:   ICD-10-CM   1. Generalized anxiety disorder  F41.1      Plan:  Patient today showing good motivation and participation in session as she worked on personal and family issues, and work stress. Trying to set healthy limits. Using journaling as a tool for working with very sensitive information and share her recent journaling in session today, mostly related to family/personal issues. Grieving some of her past experiences and doing some good work in efforts to experience her loss and grief in safe environment. Proud that she has quit smoking cigarettes for 11 days now. Feels she sometimes using her sense of humor to help her  cope with difficult issues in the past and present and sees that as a good skill to have. Did follow up more on her "feeling safe" and what helps or doesn't help.  Encouraged patient in her practice of positive behaviors including:  Reflect on her progress more often, for every negative thought create 2 positives, believing in  herself more in her ability to make significant change, getting outside some daily, using the gym located in her building at work for exercise, having some time each day just for herself, continue her journaling between sessions, practice consistent positive self talk and self-care based on our discussion today, interrupting negative/anxious thoughts and challenging them with more encouraging and empowering thoughts, stay in touch with people that are supportive and healthy for her, interacting more with coworkers in positive ways staying in the present and focusing on what she can control, looking for what might go right versus wrong, looking for things that give her hope, intentionally looking for more positives versus negatives daily, searching for the positives within herself and be able to list them, good nutrition, allowing for good sleep patterns, and recognize the strength she shows working with goal-directed behaviors to move in a direction that supports improved emotional health.  Goal review and progress/challenges noted with patient.  Next appointment within 2 weeks.  This record has been created using [employer].  Chart creation errors have been sought, but may not always have been located and corrected.  Such creation errors do not reflect on the standard of medical care provided.   Mathis Fare, LCSW

## 2021-03-02 DIAGNOSIS — G4733 Obstructive sleep apnea (adult) (pediatric): Secondary | ICD-10-CM | POA: Diagnosis not present

## 2021-03-05 ENCOUNTER — Ambulatory Visit (INDEPENDENT_AMBULATORY_CARE_PROVIDER_SITE_OTHER): Payer: 59 | Admitting: Psychiatry

## 2021-03-05 ENCOUNTER — Other Ambulatory Visit: Payer: Self-pay

## 2021-03-05 DIAGNOSIS — F411 Generalized anxiety disorder: Secondary | ICD-10-CM

## 2021-03-05 NOTE — Progress Notes (Signed)
[facility] Counselor/Therapist Progress Note  Patient ID: Wendy Maynard, MRN: [id number],    Date: 03/05/2021  Time Spent: 57 minutes   Treatment Type: Individual Therapy  Reported Symptoms: anxiety, some recent sadness but has decreased  Mental Status Exam:  Appearance:   Neat     Behavior:  Appropriate, Sharing, and Motivated  Motor:  Normal  Speech/Language:   Clear and Coherent  Affect:  anxious  Mood:  anxious  Thought process:  goal directed  Thought content:    overthinking  Sensory/Perceptual disturbances:    WNL  Orientation:  oriented to person, place, time/date, situation, day of week, month of year, year, and stated date of Jan. 23, 2023  Attention:  Good  Concentration:  Good  Memory:  WNL  Fund of knowledge:   Good  Insight:    Good  Judgment:   Good  Impulse Control:  Good   Risk Assessment: Danger to Self:  No Self-injurious Behavior: No Danger to Others: No Duty to Warn:no Physical Aggression / Violence:No  Access to Firearms a concern: No  Gang Involvement:No   Subjective:  Patient in today reporting anxiety related to personal, financial, work, and family issues.Got my c-pap machine late last week and has been very noticeable in improving her sleep. Processed some of her anxiety. Stressed living with mom in apt as mom tends to be demanding with lots of expectations.  Is getting better at setting limits with mom and not feeling guilt. Considering eventually return to school for Master's degree in some type of hospital admin/mgt. Practicing letting go more of things mom says rather than giving it lots of power and letting it upset her. Discussed more of her grief work re: her dad's death and does feel she is progressing more at this point. Some forgiveness issues with dad and feels grateful for that. DBT skills have helped her with relationship with dad. Processed some commitment issues she has had over the years. Working now on some forgiveness with mom  and self.  Has known a guy for some time and reconnected during New Years holiday and has stayed in touch with each other. Is 7 yrs younger than patient. States "he's emotionally intuitive." New friend at work continues to be a good support. Working on stopping her worrying, understanding that it doesn't help in any way. Finds that crafting helps he in soothing herself and self-calming, trying to see the glass as "half-full", and feels her "random sadness" is decreasing. "  Interventions: Solution-Oriented/Positive Psychology and Insight-Oriented  Treatment Goals: Treatment goals remain on treatment as patient works with strategies to achieve her goals.  Progress is evaluated each session and documented in the "progress" section of note. Long term goal: Stabilize anxiety level while increasing ability to function on a daily basis. Short term goal: Identify major life conflicts from the past and present that form the basis for present anxiety. Strategies: Reinforced client's insights into the role of her past emotional pain and present anxiety.  Support patient's work in further resolving these issues  Diagnosis:   ICD-10-CM   1. Generalized anxiety disorder  F41.1      Plan:  Patient today showing good motivation and active participation in session working further on her personal and family issues, as well as work stress and trying to set healthy limits. Using some DBT, CBT, and some journaling to process issues and feel more encouraged in how she is managing stressors, quitting smoking and staying quit for 22  days now. In addition to therapeutic strategies, shares that she uses her  sense of humor "to help get me through tough, challenging times". Continuing to grieve some of her childhood and other difficult past experiences, and journaling and talk therapy as been helpful with that. Aware of "commitment issues I have/had" with prior BF and with dad. Encouraged patient in her practice of  positive behaviors including: Reflect on her progress more often, for every negative thought create 2 positives, believing in herself more to make significant changees, getting outside some daily, using the gym located in her building at work for exercise, having some time each day just for herself, continue her journaling betweeen sessions, practice consistent positive self talk and self-care based on conversations in session, interrupting negative/anxious thoughts and challenging them with more encouraging and empowering thoughts, stay in touch with people that are supportive and healthy for her, interacting more with coworkers in positive ways, staying in the present and focusing on what she can control or change, looking for what might go right versus wrong, looking for things that give her hope, intentionally looking for more positives versus negatives daily, searching for the positives within herself and be able to list them, good nutrition, allowing for good sleep patterns, and feel good about the strength she shows working with goal-directed behaviors to move in a direction that supports improved emotional health.  Goal review and progress/challenges noted with patient.  Next appointment within 2 weeks.  This record has been created using [employer].  Chart creation errors have been sought, but may not always have been located and corrected.  Such creation errors do not reflect on the standard of medical care provided.  Mathis Fare, LCSW

## 2021-03-12 ENCOUNTER — Other Ambulatory Visit (HOSPITAL_BASED_OUTPATIENT_CLINIC_OR_DEPARTMENT_OTHER): Payer: Self-pay

## 2021-03-12 ENCOUNTER — Other Ambulatory Visit: Payer: Self-pay | Admitting: Psychiatry

## 2021-03-12 DIAGNOSIS — F4310 Post-traumatic stress disorder, unspecified: Secondary | ICD-10-CM

## 2021-03-13 ENCOUNTER — Other Ambulatory Visit: Payer: Self-pay | Admitting: Psychiatry

## 2021-03-13 ENCOUNTER — Other Ambulatory Visit (HOSPITAL_BASED_OUTPATIENT_CLINIC_OR_DEPARTMENT_OTHER): Payer: Self-pay

## 2021-03-13 DIAGNOSIS — F4310 Post-traumatic stress disorder, unspecified: Secondary | ICD-10-CM

## 2021-03-13 MED ORDER — ALPRAZOLAM 0.5 MG PO TABS
0.5000 mg | ORAL_TABLET | Freq: Three times a day (TID) | ORAL | 1 refills | Status: DC | PRN
  Filled 2021-03-13: qty 90, 30d supply, fill #0

## 2021-03-13 NOTE — Telephone Encounter (Signed)
Pt requesting Rx for XANAX. No RF. APT 2/28

## 2021-03-19 ENCOUNTER — Other Ambulatory Visit (HOSPITAL_BASED_OUTPATIENT_CLINIC_OR_DEPARTMENT_OTHER): Payer: Self-pay

## 2021-03-20 ENCOUNTER — Ambulatory Visit: Payer: 59 | Admitting: Psychiatry

## 2021-03-29 ENCOUNTER — Encounter: Payer: Self-pay | Admitting: Internal Medicine

## 2021-03-29 ENCOUNTER — Ambulatory Visit: Payer: 59 | Attending: Internal Medicine | Admitting: Internal Medicine

## 2021-03-29 ENCOUNTER — Other Ambulatory Visit: Payer: Self-pay

## 2021-03-29 ENCOUNTER — Other Ambulatory Visit (HOSPITAL_BASED_OUTPATIENT_CLINIC_OR_DEPARTMENT_OTHER): Payer: Self-pay

## 2021-03-29 VITALS — BP 135/89 | HR 84 | Resp 16 | Ht 67.0 in | Wt 278.0 lb

## 2021-03-29 DIAGNOSIS — Z7689 Persons encountering health services in other specified circumstances: Secondary | ICD-10-CM | POA: Diagnosis not present

## 2021-03-29 DIAGNOSIS — M25561 Pain in right knee: Secondary | ICD-10-CM

## 2021-03-29 DIAGNOSIS — Z1231 Encounter for screening mammogram for malignant neoplasm of breast: Secondary | ICD-10-CM

## 2021-03-29 DIAGNOSIS — Z6841 Body Mass Index (BMI) 40.0 and over, adult: Secondary | ICD-10-CM

## 2021-03-29 DIAGNOSIS — Z79890 Hormone replacement therapy (postmenopausal): Secondary | ICD-10-CM

## 2021-03-29 DIAGNOSIS — R030 Elevated blood-pressure reading, without diagnosis of hypertension: Secondary | ICD-10-CM | POA: Diagnosis not present

## 2021-03-29 DIAGNOSIS — M62838 Other muscle spasm: Secondary | ICD-10-CM

## 2021-03-29 DIAGNOSIS — G4733 Obstructive sleep apnea (adult) (pediatric): Secondary | ICD-10-CM | POA: Diagnosis not present

## 2021-03-29 DIAGNOSIS — G43109 Migraine with aura, not intractable, without status migrainosus: Secondary | ICD-10-CM

## 2021-03-29 DIAGNOSIS — G8929 Other chronic pain: Secondary | ICD-10-CM

## 2021-03-29 DIAGNOSIS — Z9989 Dependence on other enabling machines and devices: Secondary | ICD-10-CM

## 2021-03-29 MED ORDER — ESTRADIOL 1 MG PO TABS
1.0000 mg | ORAL_TABLET | Freq: Every day | ORAL | 5 refills | Status: DC
  Filled 2021-03-29: qty 30, 30d supply, fill #0
  Filled 2021-06-04: qty 30, 30d supply, fill #1
  Filled 2021-07-05: qty 30, 30d supply, fill #2
  Filled 2021-07-26 – 2021-07-30 (×2): qty 30, 30d supply, fill #3
  Filled 2021-08-30: qty 30, 30d supply, fill #4
  Filled 2021-10-01: qty 30, 30d supply, fill #5

## 2021-03-29 MED ORDER — CYCLOBENZAPRINE HCL 10 MG PO TABS
10.0000 mg | ORAL_TABLET | Freq: Every day | ORAL | 1 refills | Status: DC | PRN
  Filled 2021-03-29: qty 30, 30d supply, fill #0
  Filled 2021-06-04: qty 30, 30d supply, fill #1

## 2021-03-29 NOTE — Patient Instructions (Signed)

## 2021-03-29 NOTE — Progress Notes (Signed)
Patient ID: Wendy Maynard, female    DOB: [birth year]  MRN: [id number]  CC: New Patient (Initial Visit), Migraine, and Knee Pain (Right )   Subjective: Wendy Maynard is a 46 y.o. female who presents for new pt visit Her concerns today include:   Previous PCP was near [place], [place].  Moved to [place] end June 2022 but has not re-est care until now.  OSA:  Had sleep study 11/2020 that revealed severe sleep apnea and bruxism. started CPAP a few wks.  Was getting dry patches around mouth from the mask.  Used chap stick which helped decrease the dry patches. She does not think the bruxism is a major issue. Wakes up feeling refresh in mornings since being on CPAP.she has an app on her phone which keeps track of her compliance with using the CPAP.  She showed that to me today.  She is between 87 to 100% compliant.   -had sleep paralysis since childhood.  Use to be frightening as a kid but not so much as an adult.  Has vivid dreams with eyes open but can not move during these episodes.  Has not happened since last summer.  Gained a lot of wgh on Abilify.  May speak with her [facility] provider on next visit about stopping the Abilify or changing to something different.   Started a diet where she is more intentional about food choices.  She finds that she feels better when she eat healthier rather than a lot of processed foods.  Has a weight loss app on phone which she uses to track caleries.  On average she is doing about 2000 cal a day.  She was also started moving more.  She walks her dog for 10 to 15 minutes every day, she take the stairs at work when she can.  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  Overall in a days she thinks she gets in about 30 minutes of exercise altogether.  -her goal is to get to 170 lbs.  Was a runner in her 49s and was around 125 lbs at that time   Right knee pain: Has had problems with the right knee for  quite some time.  Has pain in the knee that she describes as a dull ache most of the times. -It hurts behind the knee and on top of the knee. No swelling.  Not sure of stiffness. -The knee cracks a lot when she walks. -Worse when going up and down stairs and when she first stands up from prolonged sitting. Had x-rays done a few years ago.  Told no arthritis.  Plan was to get an MRI.  Hx of migraines that were related to her menstrual cycles..  Better after hysterectomy at age 46.   -Migraines used to occur once a month prior to hysterectomy.  Now occurs a few times a year.  Had 2 bad ones in the past yr.  Gets aura with twitching of LT eye lid and sees "boxes in vision." -HA usually located back of head and behind LT eye. +photophobia, N/V sometimes.   Use to take Relpax in past.  Now just uses Excedrin Migraine or Tylenol/Motrin and drink some caffeine.  Gets good results with either of these methods.  On estradiol 1 mg and glycopyrrolate to help with feeling of "overheating."  Reports hot flashes that are pretty well controlled with  both of these medications in combination.  Initially the estradiol by itself was not controlling the hot flashes. Reports having mammogram in the fall 2021 that was normal.    Also complains of some spasms in the neck and upper back for which she was on a muscle relaxant as needed.  I see Flexeril on her medication list.  She endorses history of MDD, ADHD, PTSD.  She is plugged into behavioral health services with NP Corie Chiquito.  Reports doing well on her medications that include Abilify, Xanax, Ambien, Zoloft, Adderall  BP noted to be elevated today.  No previous history of hypertension.  Social history, family history and medical history reviewed and updated. Patient Active Problem List   Diagnosis Date Noted   Snoring 12/07/2020   Excessive sleepiness 12/07/2020   Fatigue 12/07/2020   Insomnia 12/07/2020   Sleep paralysis 12/07/2020   ADHD,  predominantly inattentive type 02/06/2018   Posttraumatic stress disorder 02/06/2018   MDD (major depressive disorder), recurrent severe, without psychosis (HCC) 04/28/2016   Chronic calculous cholecystitis 03/23/2013   Cholecystitis 02/26/2013     Current Outpatient Medications on File Prior to Visit  Medication Sig Dispense Refill   ALPRAZolam (XANAX) 0.5 MG tablet Take 1 tablet (0.5 mg total) by mouth 3 (three) times daily as needed for anxiety. 90 tablet 1   amphetamine-dextroamphetamine (ADDERALL) 15 MG tablet Take 1 tablet by mouth 2 (two) times daily. 60 tablet 0   amphetamine-dextroamphetamine (ADDERALL) 15 MG tablet Take 1 tablet by mouth 2 (two) times daily. 60 tablet 0   amphetamine-dextroamphetamine (ADDERALL) 15 MG tablet Take 1 tablet by mouth 2 (two) times daily. 60 tablet 0   ARIPiprazole (ABILIFY) 5 MG tablet Take 1/2-1 po qd 90 tablet 1   Ascorbic Acid (VITAMIN C) 100 MG tablet Take 100 mg by mouth daily.     diclofenac (VOLTAREN) 75 MG EC tablet 2 (two) times daily as needed.  (Patient not taking: Reported on 08/02/2019)     fexofenadine (ALLEGRA) 60 MG tablet Take 60 mg by mouth 2 (two) times daily as needed.     glycopyrrolate (ROBINUL) 1 MG tablet Take 1 mg by mouth 2 (two) times daily.     sertraline (ZOLOFT) 100 MG tablet Take 1 tablet (100 mg total) by mouth daily. 90 tablet 1   zolpidem (AMBIEN) 10 MG tablet Take 1 tablet (10 mg total) by mouth at bedtime as needed for sleep. 30 tablet 5   No current facility-administered medications on file prior to visit.    Allergies  Allergen Reactions   Ciprofloxacin Hives and Other (See Comments)    Started in arm as soon as drug started.   Clindamycin/Lincomycin Diarrhea and Other (See Comments)    Causes excessive diarrhea   Vicodin [Hydrocodone-Acetaminophen] Itching and Nausea And Vomiting   Penicillins Rash and Other (See Comments)    Occurred at 46 years old Has patient had a PCN reaction causing immediate rash,  facial/tongue/throat swelling, SOB or lightheadedness with hypotension: yes Has patient had a PCN reaction causing severe rash involving mucus membranes or skin necrosis: no Has patient had a PCN reaction that required hospitalization no Has patient had a PCN reaction occurring within the last 10 years:no If all of the above answers are "NO", then may proceed with Cephalosporin use.    Sulfa Antibiotics Rash    Social History   Socioeconomic History   Marital status: Single    Spouse name: Not on file   Number of children: 0  Years of education: Not on file   Highest education level: Master's degree (e.g., MA, MS, MEng, MEd, MSW, MBA)  Occupational History   Occupation: Physicist, medical at [employer]  Tobacco Use   Smoking status: Former    Types: Cigarettes    Quit date: 01/2021    Years since quitting: 0.2   Smokeless tobacco: Never   Tobacco comments:    plans to start nicotine gum  Vaping Use   Vaping Use: Never used  Substance and Sexual Activity   Alcohol use: Yes    Comment: Occasionally 1-2 x/mth   Drug use: No    Types: Marijuana    Comment: Last use 04/27/16   Sexual activity: Not Currently    Birth control/protection: Condom  Other Topics Concern   Not on file  Social History Narrative   Not on file   Social Determinants of Health   Financial Resource Strain: Not on file  Food Insecurity: Not on file  Transportation Needs: Not on file  Physical Activity: Not on file  Stress: Not on file  Social Connections: Not on file  Intimate Partner Violence: Not on file    Family History  Problem Relation Age of Onset   Depression Mother        in response to grief/loss of husband   Hypertension Father        Died sudenly at age 54. Reports father may have had Aspergers   Depression Sister    Anxiety disorder Sister    ADD / ADHD Sister    ADD / ADHD Cousin    Asperger's syndrome Cousin     Past Surgical History:  Procedure Laterality Date   CHOLECYSTECTOMY  N/A 02/26/2013   Procedure: LAPAROSCOPIC CHOLECYSTECTOMY WITH INTRAOPERATIVE CHOLANGIOGRAM;  Surgeon: Odis Hollingshead, MD;  Location: [facility];  Service: General;  Laterality: N/A;   HYSTERECTOMY ABDOMINAL WITH SALPINGECTOMY     WISDOM TOOTH EXTRACTION      ROS: Review of Systems Negative except as stated above  PHYSICAL EXAM: BP 135/89    Pulse 84    Resp 16    Ht 5\' 7"  (1.702 m)    Wt 278 lb (126.1 kg)    LMP  (LMP Unknown)    SpO2 96%    BMI 43.54 kg/m   Wt Readings from Last 3 Encounters:  03/29/21 278 lb (126.1 kg)  12/14/20 267 lb (121.1 kg)  12/07/20 267 lb (121.1 kg)    Physical Exam  General appearance - alert, well appearing, obese middle-age Caucasian female in NAD and in no distress Mental status - normal mood, behavior, speech, dress, motor activity, and thought processes Neck - supple, no significant adenopathy Chest - clear to auscultation, no wheezes, rales or rhonchi, symmetric air entry Heart - normal rate, regular rhythm, normal S1, S2, no murmurs, rubs, clicks or gallops Musculoskeletal -right knee: Large body habitus.  She has moderate to severe crepitus on passive range of motion. Extremities -no lower extremity edema.   CMP Latest Ref Rng & Units 04/28/2016 02/26/2013 07/18/2011  Glucose 65 - 99 mg/dL 90 113(H) 91  BUN 6 - 20 mg/dL 14 14 12   Creatinine 0.44 - 1.00 mg/dL 0.68 0.70 0.69  Sodium 135 - 145 mmol/L 141 143 138  Potassium 3.5 - 5.1 mmol/L 3.7 3.9 3.9  Chloride 101 - 111 mmol/L 107 104 103  CO2 22 - 32 mmol/L 25 22 22   Calcium 8.9 - 10.3 mg/dL 9.5 8.9 9.3  Total Protein 6.5 - 8.1  g/dL 8.3(H) 7.5 7.4  Total Bilirubin 0.3 - 1.2 mg/dL 0.5 0.3 0.2(L)  Alkaline Phos 38 - 126 U/L 67 113 75  AST 15 - 41 U/L 21 51(H) 17  ALT 14 - 54 U/L 18 26 11    Lipid Panel  No results found for: CHOL, TRIG, HDL, CHOLHDL, VLDL, LDLCALC, LDLDIRECT  CBC    Component Value Date/Time   WBC 9.4 04/28/2016 1836   RBC 4.61 04/28/2016 1836   HGB 12.5 04/28/2016 1836    HCT 37.7 04/28/2016 1836   PLT 272 04/28/2016 1836   MCV 81.8 04/28/2016 1836   MCV 89.0 02/10/2013 1721   MCH 27.1 04/28/2016 1836   MCHC 33.2 04/28/2016 1836   RDW 13.8 04/28/2016 1836   LYMPHSABS 2.1 02/26/2013 0154   MONOABS 0.6 02/26/2013 0154   EOSABS 0.2 02/26/2013 0154   BASOSABS 0.0 02/26/2013 0154    ASSESSMENT AND PLAN: 1. Establishing care with new doctor, encounter for   2. Morbid obesity with body mass index (BMI) of 40.0 to 44.9 in adult [facility]) Discussed and encourage healthy eating habits which she has started doing.  Patient declines referral to nutritionist at this time. Commended her on starting to to move more and encouraged her to get in at least 30 minutes 5 days a week of moderate intensity exercise.  This averages out to about 150 minutes/week. - CBC - Comprehensive metabolic panel - Lipid panel - Hemoglobin A1c  3. Chronic pain of right knee I suspect she has osteoarthritis of the knee.  Since its been a few years since her last x-ray which was not done in our system, I recommend that we get an updated x-ray.  Further management will be based on results.  If this is osteoarthritis, weight loss will be crucial - DG Knee Complete 4 Views Right; Future  4. Migraine with aura and without status migrainosus, not intractable Discussed the importance of avoiding triggers, regular exercise, good sleep and helping to control migraines.  Her migraines are infrequent so I do not recommend daily prophylactic medication.  She reports good results with taking over-the-counter Excedrin Migraine or Tylenol plus ibuprofen in combination with a caffeinated beverage as abortive therapy.  5. OSA on CPAP Encouraged her to continue using her CPAP consistently.  She reports feeling refresh when she wakes up in the mornings since being on the CPAP.  Recommend that she check with her dentist about an oral guard for the bruxism  6. Postmenopausal hormone replacement  therapy Refill given on estradiol.  I will research the glycopyrrolate a little bit more to see if it is indicated for hot flashes before refilling. Would need to keep up-to-date with mammogram given that she is on HRT. - estradiol (ESTRACE) 1 MG tablet; Take 1 tablet (1 mg total) by mouth daily.  Dispense: 30 tablet; Refill: 5  7. Neck muscle spasm I have given refill on Flexeril to use as needed - cyclobenzaprine (FLEXERIL) 10 MG tablet; Take 1 tablet (10 mg total) by mouth daily as needed.  Dispense: 30 tablet; Refill: 1  8. Elevated blood pressure reading without diagnosis of hypertension DASH diet discussed and encouraged.  Printed information given.  9. Encounter for screening mammogram for malignant neoplasm of breast - MM Digital Screening; Future     Patient was given the opportunity to ask questions.  Patient verbalized understanding of the plan and was able to repeat key elements of the plan.   Orders Placed This Encounter  Procedures  DG Knee Complete 4 Views Right   MM Digital Screening   CBC   Comprehensive metabolic panel   Lipid panel   Hemoglobin A1c     Requested Prescriptions   Signed Prescriptions Disp Refills   estradiol (ESTRACE) 1 MG tablet 30 tablet 5    Sig: Take 1 tablet (1 mg total) by mouth daily.   cyclobenzaprine (FLEXERIL) 10 MG tablet 30 tablet 1    Sig: Take 1 tablet (10 mg total) by mouth daily as needed.    Return in about 6 weeks (around 05/10/2021) for for BP check.  Karle Plumber, MD, FACP

## 2021-03-30 LAB — COMPREHENSIVE METABOLIC PANEL
ALT: 14 IU/L (ref 0–32)
AST: 15 IU/L (ref 0–40)
Albumin/Globulin Ratio: 2 (ref 1.2–2.2)
Albumin: 4.9 g/dL — ABNORMAL HIGH (ref 3.8–4.8)
Alkaline Phosphatase: 114 IU/L (ref 44–121)
BUN/Creatinine Ratio: 19 (ref 9–23)
BUN: 13 mg/dL (ref 6–24)
Bilirubin Total: <0.2 mg/dL (ref 0.0–1.2)
CO2: 20 mmol/L (ref 20–29)
Calcium: 9.5 mg/dL (ref 8.7–10.2)
Chloride: 103 mmol/L (ref 96–106)
Creatinine, Ser: 0.69 mg/dL (ref 0.57–1.00)
Globulin, Total: 2.4 g/dL (ref 1.5–4.5)
Glucose: 83 mg/dL (ref 70–99)
Potassium: 4.2 mmol/L (ref 3.5–5.2)
Sodium: 141 mmol/L (ref 134–144)
Total Protein: 7.3 g/dL (ref 6.0–8.5)
eGFR: 110 mL/min/{1.73_m2} (ref >59)

## 2021-03-30 LAB — CBC
Hematocrit: 37.9 % (ref 34.0–46.6)
Hemoglobin: 12.1 g/dL (ref 11.1–15.9)
MCH: 23.7 pg — ABNORMAL LOW (ref 26.6–33.0)
MCHC: 31.9 g/dL (ref 31.5–35.7)
MCV: 74 fL — ABNORMAL LOW (ref 79–97)
Platelets: 263 10*3/uL (ref 150–450)
RBC: 5.1 x10E6/uL (ref 3.77–5.28)
RDW: 15.4 % (ref 11.7–15.4)
WBC: 10.1 10*3/uL (ref 3.4–10.8)

## 2021-03-30 LAB — LIPID PANEL
Chol/HDL Ratio: 4.2 ratio (ref 0.0–4.4)
Cholesterol, Total: 224 mg/dL — ABNORMAL HIGH (ref 100–199)
HDL: 53 mg/dL (ref >39)
LDL Chol Calc (NIH): 140 mg/dL — ABNORMAL HIGH (ref 0–99)
Triglycerides: 173 mg/dL — ABNORMAL HIGH (ref 0–149)
VLDL Cholesterol Cal: 31 mg/dL (ref 5–40)

## 2021-03-30 LAB — HEMOGLOBIN A1C
Est. average glucose Bld gHb Est-mCnc: 126 mg/dL
Hgb A1c MFr Bld: 6 % — ABNORMAL HIGH (ref 4.8–5.6)

## 2021-04-02 DIAGNOSIS — G4733 Obstructive sleep apnea (adult) (pediatric): Secondary | ICD-10-CM | POA: Diagnosis not present

## 2021-04-03 ENCOUNTER — Ambulatory Visit: Payer: 59 | Admitting: Psychiatry

## 2021-04-04 ENCOUNTER — Encounter: Payer: Self-pay | Admitting: Internal Medicine

## 2021-04-04 MED ORDER — GLYCOPYRROLATE 1 MG PO TABS
1.0000 mg | ORAL_TABLET | Freq: Two times a day (BID) | ORAL | 4 refills | Status: DC
  Filled 2021-04-04: qty 60, 30d supply, fill #0
  Filled 2021-07-05: qty 60, 30d supply, fill #1
  Filled 2021-07-26 – 2021-07-30 (×2): qty 60, 30d supply, fill #2
  Filled 2021-10-30: qty 60, 30d supply, fill #3
  Filled 2021-11-26: qty 60, 30d supply, fill #4

## 2021-04-05 ENCOUNTER — Other Ambulatory Visit (HOSPITAL_BASED_OUTPATIENT_CLINIC_OR_DEPARTMENT_OTHER): Payer: Self-pay

## 2021-04-06 ENCOUNTER — Other Ambulatory Visit (HOSPITAL_BASED_OUTPATIENT_CLINIC_OR_DEPARTMENT_OTHER): Payer: Self-pay

## 2021-04-09 ENCOUNTER — Other Ambulatory Visit (HOSPITAL_BASED_OUTPATIENT_CLINIC_OR_DEPARTMENT_OTHER): Payer: Self-pay

## 2021-04-10 ENCOUNTER — Encounter: Payer: Self-pay | Admitting: Psychiatry

## 2021-04-10 ENCOUNTER — Other Ambulatory Visit: Payer: Self-pay

## 2021-04-10 ENCOUNTER — Ambulatory Visit (INDEPENDENT_AMBULATORY_CARE_PROVIDER_SITE_OTHER): Payer: 59 | Admitting: Psychiatry

## 2021-04-10 ENCOUNTER — Other Ambulatory Visit (HOSPITAL_BASED_OUTPATIENT_CLINIC_OR_DEPARTMENT_OTHER): Payer: Self-pay

## 2021-04-10 DIAGNOSIS — F3342 Major depressive disorder, recurrent, in full remission: Secondary | ICD-10-CM

## 2021-04-10 DIAGNOSIS — F900 Attention-deficit hyperactivity disorder, predominantly inattentive type: Secondary | ICD-10-CM

## 2021-04-10 DIAGNOSIS — F4310 Post-traumatic stress disorder, unspecified: Secondary | ICD-10-CM | POA: Diagnosis not present

## 2021-04-10 MED ORDER — SERTRALINE HCL 100 MG PO TABS
150.0000 mg | ORAL_TABLET | Freq: Every day | ORAL | 1 refills | Status: DC
  Filled 2021-04-10: qty 135, 90d supply, fill #0
  Filled 2021-07-05: qty 135, 90d supply, fill #1

## 2021-04-10 MED ORDER — ARIPIPRAZOLE 5 MG PO TABS
ORAL_TABLET | ORAL | 1 refills | Status: DC
  Filled 2021-04-10 – 2021-06-04 (×2): qty 90, 90d supply, fill #0
  Filled 2021-07-05: qty 90, 90d supply, fill #1

## 2021-04-10 MED ORDER — AMPHETAMINE-DEXTROAMPHETAMINE 15 MG PO TABS
15.0000 mg | ORAL_TABLET | Freq: Two times a day (BID) | ORAL | 0 refills | Status: DC
  Filled 2021-07-05: qty 60, 30d supply, fill #0

## 2021-04-10 MED ORDER — AMPHETAMINE-DEXTROAMPHETAMINE 15 MG PO TABS
15.0000 mg | ORAL_TABLET | Freq: Two times a day (BID) | ORAL | 0 refills | Status: DC
  Filled 2021-04-10: qty 60, 30d supply, fill #0

## 2021-04-10 MED ORDER — ALPRAZOLAM 0.5 MG PO TABS
0.5000 mg | ORAL_TABLET | Freq: Three times a day (TID) | ORAL | 2 refills | Status: DC | PRN
  Filled 2021-04-10: qty 90, 30d supply, fill #0
  Filled 2021-05-08: qty 90, 30d supply, fill #1
  Filled 2021-06-04: qty 90, 30d supply, fill #2

## 2021-04-10 MED ORDER — AMPHETAMINE-DEXTROAMPHETAMINE 15 MG PO TABS
15.0000 mg | ORAL_TABLET | Freq: Two times a day (BID) | ORAL | 0 refills | Status: DC
  Filled 2021-05-08: qty 60, 30d supply, fill #0

## 2021-04-10 NOTE — Progress Notes (Signed)
Wendy Maynard [id number] 1975/08/31 46 y.o.  Subjective:   Patient ID:  Wendy Maynard is a 46 y.o. (DOB 11-05-1975) female.  Chief Complaint:  Chief Complaint  Patient presents with   Anxiety    HPI Wendy Maynard presents to the office today for follow-up of anxiety, depression, ADHD, and insomnia. She reports that she continues to have some anxiety. She reports some worry. Has had some nightmares. She reports that she has increased anxiety in certain circumstances, such as when someone yelled at her in a parking lot or when someone invades her personal space. Occ intrusive memories. She reports that sudden loss of her father was traumatic. She reports that she has had a couple of panic attacks with shortness of breath and tries to get to a private place and be by herself. She reports occ catastrophic thoughts. She reports that she notices muscle tension. She feels that she is managing anxiety ok.   Denies depressed mood. "I've been hopeful." Sleeping well most nights. Takes naps on weekends. Energy has been good. Motivation has been good. Concentration has been good. Able to sustain focus for long periods of time. She reports that her appetite has been ok. Denies SI.   Quit smoking about 2 months ago. She reports that the first few days were difficult and then it got easier. Denies any recent cravings to smoke.   Now using cPap consistently. She reports that she uses it nightly and feels that she has more energy in the morning.   Lives with her mother. She spends some time with her sister. She has reconnected with some friends and cousins in the area. Friends with some coworkers. Walks with coworkers on Wednesday. Has been walking her dog some.  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   Has been enjoying [employer] and finds this calming.   Upcoming anniversary of Dad's death and his birthday just passed.   She reports that she has been using Xanax prn less.   Past  Psychiatric Medication Trials: Abilify- Helped mood. Wt gain.  Sertraline-Helpful  Lexapro- Felt tired and irritable Prozac Cymbalta- adverse effects Wellbutrin XL- Insomnia Trazodone-ineffective Ambien Xanax Lorazepam Adderall  Adderall XR- Insomnia, irritability, dry mouth  GAD-7    Flowsheet Row Office Visit from 03/29/2021 in [facility]  Total GAD-7 Score 7      PHQ2-9    Flowsheet Row Office Visit from 03/29/2021 in [facility]  PHQ-2 Total Score 0      Flowsheet Row ED from 11/16/2020 in [facility]  C-SSRS RISK CATEGORY No Risk        Review of Systems:  Review of Systems  Musculoskeletal:  Negative for gait problem.       Knee pain  Neurological:  Negative for tremors.  Psychiatric/Behavioral:         Please refer to HPI   Medications: I have reviewed the patient's current medications.  Current Outpatient Medications  Medication Sig Dispense Refill   Ascorbic Acid (VITAMIN C) 100 MG tablet Take 100 mg by mouth daily.     cyclobenzaprine (FLEXERIL) 10 MG tablet Take 1 tablet (10 mg total) by mouth daily as needed. 30 tablet 1   estradiol (ESTRACE) 1 MG tablet Take 1 tablet (1 mg total) by mouth daily. 30 tablet 5   glycopyrrolate (ROBINUL) 1 MG tablet Take 1 tablet (1 mg total) by mouth 2 (two) times daily. 60 tablet 4   zolpidem (  AMBIEN) 10 MG tablet Take 1 tablet (10 mg total) by mouth at bedtime as needed for sleep. 30 tablet 5   ALPRAZolam (XANAX) 0.5 MG tablet Take 1 tablet (0.5 mg total) by mouth 3 (three) times daily as needed for anxiety. 90 tablet 2   [START ON 06/05/2021] amphetamine-dextroamphetamine (ADDERALL) 15 MG tablet Take 1 tablet by mouth 2 (two) times daily. 60 tablet 0   [START ON 05/08/2021] amphetamine-dextroamphetamine (ADDERALL) 15 MG tablet Take 1 tablet by mouth 2 (two) times daily. 60 tablet 0   amphetamine-dextroamphetamine (ADDERALL) 15 MG  tablet Take 1 tablet by mouth 2 (two) times daily. 60 tablet 0   ARIPiprazole (ABILIFY) 5 MG tablet Take 1/2-1 po qd 90 tablet 1   diclofenac (VOLTAREN) 75 MG EC tablet 2 (two) times daily as needed.  (Patient not taking: Reported on 08/02/2019)     fexofenadine (ALLEGRA) 60 MG tablet Take 60 mg by mouth 2 (two) times daily as needed. (Patient not taking: Reported on 04/10/2021)     sertraline (ZOLOFT) 100 MG tablet Take 1.5 tablets (150 mg total) by mouth daily. 135 tablet 1   No current facility-administered medications for this visit.    Medication Side Effects: Other: Possible weight gain  Allergies:  Allergies  Allergen Reactions   Ciprofloxacin Hives and Other (See Comments)    Started in arm as soon as drug started.   Clindamycin/Lincomycin Diarrhea and Other (See Comments)    Causes excessive diarrhea   Vicodin [Hydrocodone-Acetaminophen] Itching and Nausea And Vomiting   Penicillins Rash and Other (See Comments)    Occurred at 46 years old Has patient had a PCN reaction causing immediate rash, facial/tongue/throat swelling, SOB or lightheadedness with hypotension: yes Has patient had a PCN reaction causing severe rash involving mucus membranes or skin necrosis: no Has patient had a PCN reaction that required hospitalization no Has patient had a PCN reaction occurring within the last 10 years:no If all of the above answers are "NO", then may proceed with Cephalosporin use.    Sulfa Antibiotics Rash    Past Medical History:  Diagnosis Date   Abdominal pain in female patient 11/2012   Anxiety    Depression    Diabetes mellitus without complication (HCC)    Migraines    Polycystic ovarian disease     Past Medical History, Surgical history, Social history, and Family history were reviewed and updated as appropriate.   Please see review of systems for further details on the patient's review from today.   Objective:   Physical Exam:  BP 122/80    Pulse 79    LMP  (LMP  Unknown)   Physical Exam Constitutional:      General: She is not in acute distress. Musculoskeletal:        General: No deformity.  Neurological:     Mental Status: She is alert and oriented to person, place, and time.     Coordination: Coordination normal.  Psychiatric:        Attention and Perception: Attention and perception normal. She does not perceive auditory or visual hallucinations.        Mood and Affect: Mood is anxious. Mood is not depressed. Affect is not labile, blunt, angry or inappropriate.        Speech: Speech normal.        Behavior: Behavior normal.        Thought Content: Thought content normal. Thought content is not paranoid or delusional. Thought content does not include homicidal  or suicidal ideation. Thought content does not include homicidal or suicidal plan.        Cognition and Memory: Cognition and memory normal.        Judgment: Judgment normal.     Comments: Insight intact    Lab Review:     Component Value Date/Time   NA 141 03/29/2021 1514   K 4.2 03/29/2021 1514   CL 103 03/29/2021 1514   CO2 20 03/29/2021 1514   GLUCOSE 83 03/29/2021 1514   GLUCOSE 90 04/28/2016 1836   BUN 13 03/29/2021 1514   CREATININE 0.69 03/29/2021 1514   CALCIUM 9.5 03/29/2021 1514   PROT 7.3 03/29/2021 1514   ALBUMIN 4.9 (H) 03/29/2021 1514   AST 15 03/29/2021 1514   ALT 14 03/29/2021 1514   ALKPHOS 114 03/29/2021 1514   BILITOT <0.2 03/29/2021 1514   GFRNONAA >60 04/28/2016 1836   GFRAA >60 04/28/2016 1836       Component Value Date/Time   WBC 10.1 03/29/2021 1514   WBC 9.4 04/28/2016 1836   RBC 5.10 03/29/2021 1514   RBC 4.61 04/28/2016 1836   HGB 12.1 03/29/2021 1514   HCT 37.9 03/29/2021 1514   PLT 263 03/29/2021 1514   MCV 74 (L) 03/29/2021 1514   MCH 23.7 (L) 03/29/2021 1514   MCH 27.1 04/28/2016 1836   MCHC 31.9 03/29/2021 1514   MCHC 33.2 04/28/2016 1836   RDW 15.4 03/29/2021 1514   LYMPHSABS 2.1 02/26/2013 0154   MONOABS 0.6 02/26/2013  0154   EOSABS 0.2 02/26/2013 0154   BASOSABS 0.0 02/26/2013 0154    No results found for: POCLITH, LITHIUM   No results found for: PHENYTOIN, PHENOBARB, VALPROATE, CBMZ   .res Assessment: Plan:    Pt seen for 30 minutes and time spent discussing treatment options for anxiety to include Buspar or increasing Sertraline. Pt agrees to increase in Sertraline. Will increase Sertraline to 150 mg po qd to improve anxiety.  Discussed possible weight gain with Abilify and discussing considering dose reduction if s/s improve with increase in Sertraline. Discussed that she could reduce Abilify to 5 mg 1/2 tab or continue one tab daily.  Continue Adderall 15 mg po BID for ADHD.  Continue Ambien 10 mg po QHS prn insomnia. Continue Alprazolam 0.5 mg po TID prn anxiety.  Pt to follow-up in 3 months or sooner if clinically indicated.  Patient advised to contact office with any questions, adverse effects, or acute worsening in signs and symptoms.   Wendy Maynard was seen today for anxiety.  Diagnoses and all orders for this visit:  Recurrent major depressive disorder, in full remission (HCC) -     sertraline (ZOLOFT) 100 MG tablet; Take 1.5 tablets (150 mg total) by mouth daily. -     ARIPiprazole (ABILIFY) 5 MG tablet; Take 1/2-1 po qd  Posttraumatic stress disorder -     sertraline (ZOLOFT) 100 MG tablet; Take 1.5 tablets (150 mg total) by mouth daily. -     ALPRAZolam (XANAX) 0.5 MG tablet; Take 1 tablet (0.5 mg total) by mouth 3 (three) times daily as needed for anxiety.  Attention deficit hyperactivity disorder (ADHD), predominantly inattentive type -     amphetamine-dextroamphetamine (ADDERALL) 15 MG tablet; Take 1 tablet by mouth 2 (two) times daily. -     amphetamine-dextroamphetamine (ADDERALL) 15 MG tablet; Take 1 tablet by mouth 2 (two) times daily. -     amphetamine-dextroamphetamine (ADDERALL) 15 MG tablet; Take 1 tablet by mouth 2 (two) times daily.  Please see After Visit Summary for  patient specific instructions.  Future Appointments  Date Time Provider Department Center  04/17/2021  5:00 PM Mathis Fare, LCSW [facility] None  04/30/2021  5:00 PM Mathis Fare, LCSW [facility] None  05/22/2021  5:00 PM Mathis Fare, LCSW [facility] None  06/05/2021  5:00 PM Mathis Fare, LCSW [facility] None  06/08/2021  4:10 PM Marcine Matar, MD [facility] None  07/10/2021  4:20 PM Mozingo, Thereasa Solo, NP [facility] None    No orders of the defined types were placed in this encounter.   -------------------------------

## 2021-04-16 ENCOUNTER — Other Ambulatory Visit (HOSPITAL_BASED_OUTPATIENT_CLINIC_OR_DEPARTMENT_OTHER): Payer: Self-pay

## 2021-04-17 ENCOUNTER — Ambulatory Visit (INDEPENDENT_AMBULATORY_CARE_PROVIDER_SITE_OTHER): Payer: 59 | Admitting: Psychiatry

## 2021-04-17 ENCOUNTER — Other Ambulatory Visit: Payer: Self-pay

## 2021-04-17 DIAGNOSIS — F411 Generalized anxiety disorder: Secondary | ICD-10-CM | POA: Diagnosis not present

## 2021-04-17 NOTE — Progress Notes (Signed)
?    [facility] Counselor/Therapist Progress Note ? ?Patient ID: Wendy Maynard, MRN: [id number],   ? ?Date: 04/17/2021 ? ?Time Spent: 58 minutes  ? ?Treatment Type: Individual Therapy ? ?Reported Symptoms: anxious, frustrated, and anger, with anxiety reported to be her main symptom ? ?Mental Status Exam: ? ?Appearance:   Casual     ?Behavior:  Appropriate, Sharing, and Motivated  ?Motor:  Normal  ?Speech/Language:   Clear and Coherent  ?Affect:  anxious  ?Mood:  anxious  ?Thought process:  normal  ?Thought content:    WNL  ?Sensory/Perceptual disturbances:    WNL  ?Orientation:  oriented to person, place, time/date, situation, day of week, month of year, year, and stated date of April 17, 2021  ?Attention:  Good  ?Concentration:  Good  ?Memory:  WNL  ?Fund of knowledge:   Good  ?Insight:    Good  ?Judgment:   Good  ?Impulse Control:  Good  ? ?Risk Assessment: ?Danger to Self:  No ?Self-injurious Behavior: No ?Danger to Others: No ?Duty to Warn:no ?Physical Aggression / Violence:No  ?Access to Firearms a concern: No  ?Gang Involvement:No  ? ?Subjective:  Patient in today reporting anxiety, frustration, and anger. Most of her anxiety/frustration/anger she states is about impatient people, issues with her mom, sister, financial, and personal. Processing today some issues involving mom and sister. Sister can be very hurtful and  self-absorbed. Met up with a former teacher-friend and has been spending some free time with him.Still having much better sleep. Processes more of her anxiety, frustration, and anger especially re: family relationship issues, challenges of living with her emotionally volatile and blaming mother, and personal situations. Practicing setting more limits with mom and not feeling guilty. Continues her grief work re: dad's death and feel she is progressing on that more at this point. Commitment issues over the years. Feels that some prior DBT has helped her with her healing. Working on forgiveness issue  for herself and others, including mom. Trying to decrease worrying, realizing that only takes her in a negative direction.  Is progressing some with this.  Continues to enjoy crafting and also finds it therapeutically self-calming. ? ? Interventions: Solution-Oriented/Positive Psychology and Ego-Supportive ?  ?Treatment Goals: ?Treatment goals remain on treatment as patient works with strategies to achieve her goals.  Progress is evaluated each session and documented in the "progress" section of note. ?Long term goal: ?Stabilize anxiety level while increasing ability to function on a daily basis. ?Short term goal: ?Identify major life conflicts from the past and present that form the basis for present anxiety. ?Strategies: ?Reinforced client's insights into the role of her past emotional pain and present anxiety.  Support patient's work in further resolving these issues ?  ? ?Diagnosis: ?  ICD-10-CM   ?1. Generalized anxiety disorder  F41.1   ?  ? ?Plan:  Patient today showing motivation and good participation in session as she worked further on her anxiety, frustration, and anger.  Able to identify the sources of these feelings including family issues, financial stressors, stressors with her sister, and dealing with inpatient people.  Working on not feeling guilty as much and setting more limits with mom as needed, and also her sister.  Mixed feelings within the family especially with sister.  Spending some time with a friend from the past that she has reconnected with and enjoying the time together.  Continued work on forgiveness for herself and others and also encouraged her continuing to use some DBT strategies and  journaling to help support her healing.  Wants to bring up "commitment issues" next session again, as we ran out of time today. ?Encouraged patient in her practice of positive behaviors including: Believing in herself more to make significant changes, reflect on her progress daily, for every negative  thought replace it with 2 positives, getting outside some daily, using the gym located in her building at work for exercise, having some time each day just for herself, continue her journaling between sessions, practice consistent positive self talk and self-care based on conversations in session, interrupting negative/anxious thoughts and challenging them with more encouraging and empowering thoughts, stay in touch with people that are supportive and healthy for her, interacting more with coworkers in positive ways, staying in the present and focusing on what she can control or change, looking more for what might go right versus wrong, looking for things that give her hope, intentionally looking for more positives versus negatives daily, searching for the positives within herself and be able to list them, good nutrition, allowing for good sleep patterns, and feel good about the strength she shows working with goal-directed behaviors to move in a direction that supports improved emotional health. ? ?Goal review and progress/challenges noted with patient. ? ?Next appointment within 2 to 3 weeks. ? ?This record has been created using [employer].  Chart creation errors have been sought, but may not always have been located and corrected.  Such creation errors do not reflect on the standard of medical care provided. ? ? ?Shanon Ace, LCSW ? ? ? ? ? ? ? ? ? ? ? ? ? ? ? ? ? ? ?

## 2021-04-30 ENCOUNTER — Other Ambulatory Visit: Payer: Self-pay

## 2021-04-30 ENCOUNTER — Ambulatory Visit (INDEPENDENT_AMBULATORY_CARE_PROVIDER_SITE_OTHER): Payer: 59 | Admitting: Psychiatry

## 2021-04-30 DIAGNOSIS — F411 Generalized anxiety disorder: Secondary | ICD-10-CM | POA: Diagnosis not present

## 2021-04-30 DIAGNOSIS — G4733 Obstructive sleep apnea (adult) (pediatric): Secondary | ICD-10-CM | POA: Diagnosis not present

## 2021-04-30 NOTE — Progress Notes (Signed)
?    [facility] Counselor/Therapist Progress Note ? ?Patient ID: Wendy Maynard, MRN: [id number],   ? ?Date: 04/30/2021 ? ?Time Spent: 55 minutes  ? ?Treatment Type: Individual Therapy ? ?Reported Symptoms:  anxiety (some better), stressed  ? ?Mental Status Exam: ? ?Appearance:   Casual     ?Behavior:  Appropriate, Sharing, and Motivated  ?Motor:  Normal  ?Speech/Language:   Clear and Coherent  ?Affect:  Anxious   ?Mood:  anxious  ?Thought process:  goal directed  ?Thought content:    Situational overthinking  ?Sensory/Perceptual disturbances:    WNL  ?Orientation:  oriented to person, place, time/date, situation, day of week, month of year, year, and stated date of April 30, 2021  ?Attention:  Good  ?Concentration:  Good  ?Memory:  WNL  ?Fund of knowledge:   Good  ?Insight:    Good  ?Judgment:   Good  ?Impulse Control:  Good  ? ?Risk Assessment: ?Danger to Self:  No ?Self-injurious Behavior: No ?Danger to Others: No ?Duty to Warn:no ?Physical Aggression / Violence:No  ?Access to Firearms a concern: No  ?Gang Involvement:No  ? ?Subjective:   Patient in today reporting anxiety and some stressed feelings at work and outside of work. Less frustration and less anger since last session. Had issues with a cat she had adopted from another person but "stood my ground and it all ended good." Spending time more with a female "friend". Relationship feels comfortable as they've discussed their boundaries. Issues continue with her mom, sister, and relate to finances and personal issues which patient processed more today. Actually "feeling happier at times and attributes this due more to developing a good friendship that has been a part of her feeling happier. Stresses it's not a "romantic relationship" but rather a "good friendship". Having a good friend feels "so good to me." Dad's "death anniversary date was  recently" and this year "I was sad but managed it better without it being overwhelming." "I think time has helped me heal  and move on." Sleep remains improved. Processes more issues from the past with her sister and mom and working on more forgiveness of them, and also of herself. "Worries some about the future and certain fears and uncertainties", and finds that distractions (games, movies, tv, walk the dogs) helps, along with some positive self-talk. Continued efforts to decrease worrying as "I want to enjoy life and not fear it." Starting to notice positives more "which is different for me."  (Reminded me at end of session that she does want to speak about some commitment issues next session.) ? ?Interventions: Ego-Supportive and Insight-Oriented ? ?Treatment Goals: ?Treatment goals remain on treatment as patient works with strategies to achieve her goals.  Progress is evaluated each session and documented in the "progress" section of note. ?Long term goal: ?Stabilize anxiety level while increasing ability to function on a daily basis. ?Short term goal: ?Identify major life conflicts from the past and present that form the basis for present anxiety. ?Strategies: ?Reinforced client's insights into the role of her past emotional pain and present anxiety.  Support patient's work in further resolving these issues ?  ?Diagnosis: ?  ICD-10-CM   ?1. Generalized anxiety disorder  F41.1   ?  ? ?Plan:  Showing good motivation and participation in session as she further worked on her anxiety, more issues from the past regarding her sister and mom but at this point is considering more forgiveness of both of them and herself.  Dad's anniversary date  of his death was recent and she noted that this year she was sad but not overwhelmed by her feelings and instead had more of "I am moving forward" thoughts/feelings.  Developing a really healthy friendship with a guy that she has known over the years but not previously this close and "stresses that because it is a friendship and not necessarily a love relationship, that I am finding it very helpful  as I continue working on issues in therapy. "  Encouraged patient in her practice of positive behaviors including: Reflect on her progress daily, believing in herself more to make significant changes, using the gym located in her building at work for exercise, for every negative thought replace it with 2 positives, getting outside some daily, having some time each day just for herself, continue her journaling between sessions, practice consistent positive self talk and self-care based on conversations in session, interrupting negative/anxious thoughts and challenging them with more encouraging and empowering thoughts, interacting more with coworkers in positive ways, staying in the present and focusing on what she can control or change, stay in touch with people that are supportive and healthy for her, looking for more of what might go right versus wrong, looking for things that give her hope, intentionally looking for more positives versus negatives daily, searching for the positives within herself and be able to name them, good nutrition, allowing for good sleep patterns, and recognize the strength she shows working with goal-directed behaviors to move in a direction that supports her improved emotional health and wellbeing. ? ?Review and progress/challenges noted with patient. ? ?Next appointment within 2 to 3 weeks. ? ?This record has been created using [employer].  Chart creation errors have been sought, but may not always have been located and corrected.  Such creation errors do not reflect on the standard of medical care provided. ? ? ?Shanon Ace, LCSW ? ? ? ? ? ? ? ? ? ? ? ? ? ? ? ? ? ? ?

## 2021-05-08 ENCOUNTER — Other Ambulatory Visit (HOSPITAL_BASED_OUTPATIENT_CLINIC_OR_DEPARTMENT_OTHER): Payer: Self-pay

## 2021-05-17 ENCOUNTER — Other Ambulatory Visit (HOSPITAL_BASED_OUTPATIENT_CLINIC_OR_DEPARTMENT_OTHER): Payer: Self-pay

## 2021-05-22 ENCOUNTER — Ambulatory Visit (INDEPENDENT_AMBULATORY_CARE_PROVIDER_SITE_OTHER): Payer: 59 | Admitting: Psychiatry

## 2021-05-22 DIAGNOSIS — F411 Generalized anxiety disorder: Secondary | ICD-10-CM

## 2021-05-22 NOTE — Progress Notes (Signed)
?    [facility] Counselor/Therapist Progress Note ? ?Patient ID: Wendy Maynard, MRN: [id number],   ? ?Date: 05/22/2021 ? ?Time Spent: 58 minutes  ? ?Treatment Type: Individual Therapy ? ?Reported Symptoms: sad (issues with sister upsetting), anxious ? ?Mental Status Exam: ? ?Appearance:   Casual     ?Behavior:  Appropriate  ?Motor:  Normal  ?Speech/Language:   Clear and Coherent  ?Affect:  anxious  ?Mood:  anxious and sad  ?Thought process:  goal directed  ?Thought content:    Some overthinking  ?Sensory/Perceptual disturbances:    WNL  ?Orientation:  oriented to person, place, time/date, situation, day of week, month of year, year, and stated date of May 22, 2021  ?Attention:  Good  ?Concentration:  Good  ?Memory:  WNL  ?Fund of knowledge:   Good  ?Insight:    Good  ?Judgment:   Good  ?Impulse Control:  Good  ? ?Risk Assessment: ?Danger to Self:  No ?Self-injurious Behavior: No ?Danger to Others: No ?Duty to Warn:no ?Physical Aggression / Violence:No  ?Access to Firearms a concern: No  ?Gang Involvement:No  ? ?Subjective:  Patient in today reporting anxiety and some sadness. Anxiety re: friendship/relationship and realizing she and friend that she's gotten closer to, seems to be changing some and he had been a big support to patient. Processing today her concerns and feelings re: the relationship with friend that is changing. Acknowledging some social anxiety impacting some of her relationships in groups. Continues some processing of thoughts/feelings after recent anniversary of Dad's death. "Better than I used to be with this." Less overwhelmed. Trying to continue to let herself heal and move forward. Sleep fluctuating some recently, some nights better than others. Using C-Pap machine. Continued work on  decreasing her worrying; also focusing on  forgiveness relating to mom and sister and herself as part of moving forward. Less fearful and less worried more recently. Using more positive self-talk recently. Trying  to get/keep more of a sense of groundedness.  ? ?Interventions: Cognitive Behavioral Therapy, Solution-Oriented/Positive Psychology, and Insight-Oriented ? ?Treatment Goals: ?Treatment goals remain on treatment as patient works with strategies to achieve her goals.  Progress is evaluated each session and documented in the "progress" section of note. ?Long term goal: ?Stabilize anxiety level while increasing ability to function on a daily basis. ?Short term goal: ?Identify major life conflicts from the past and present that form the basis for present anxiety. ?Strategies: ?Reinforced client's insights into the role of her past emotional pain and present anxiety.  Support patient's work in further resolving these issues ? ? ?Diagnosis: ?  ICD-10-CM   ?1. Generalized anxiety disorder  F41.1   ?  ? ?Plan:  Patient today showing good motivation and active participation in session as she focused more on her anxiety, a close relationship that seems to be changing some, "fears of being happy because it may not last", wanting to move forward with less fear, less anxiety, and less self-doubt.  Seems to be gaining more confidence in herself and in her making significant decisions in life.  Feeling good about some of her work on forgiveness especially within the family and of herself which is important in her moving forward. Encouraged patient in the practice of more positive behaviors including: Getting outside some daily and walking, reflect on her progress daily, believing in herself more to make significant changes, using the gym located in her building at work for exercise, for every negative thought replace it with 2 positives, having  some time each day just for herself, continue her journaling between sessions, practice consistent positive self talk and self-care based on conversations in session, interrupting negative/anxious thoughts and challenging them with more encouraging and empowering thoughts, interacting more  with coworkers in positive ways, staying in the present and focusing on what she can control or change, staying in touch with people that are supportive and healthy for her, looking for more of what might go right versus wrong, looking for things that give her hope, intentionally looking for more positives versus negatives daily, searching for the positives within herself and be able to name them, good nutrition, allowing for good sleep patterns, and realize the strength she shows when working with goal-directed behaviors to move in a direction that supports her improved emotional health. ? ?Goal review and progress/challenges noted with patient. ? ?Next appointment within 2 to 3 weeks. ? ?This record has been created using [employer].  Chart creation errors have been sought, but may not always have been located and corrected.  Such creation errors do not reflect on the standard of medical care provided. ? ? ?Mathis Fare, LCSW ? ? ? ? ? ? ? ? ? ? ? ? ? ? ? ? ? ? ?

## 2021-05-25 ENCOUNTER — Ambulatory Visit: Payer: 59

## 2021-05-31 DIAGNOSIS — G4733 Obstructive sleep apnea (adult) (pediatric): Secondary | ICD-10-CM | POA: Diagnosis not present

## 2021-06-04 ENCOUNTER — Other Ambulatory Visit (HOSPITAL_BASED_OUTPATIENT_CLINIC_OR_DEPARTMENT_OTHER): Payer: Self-pay

## 2021-06-04 ENCOUNTER — Other Ambulatory Visit: Payer: Self-pay | Admitting: Psychiatry

## 2021-06-04 DIAGNOSIS — F900 Attention-deficit hyperactivity disorder, predominantly inattentive type: Secondary | ICD-10-CM

## 2021-06-05 ENCOUNTER — Ambulatory Visit (INDEPENDENT_AMBULATORY_CARE_PROVIDER_SITE_OTHER): Payer: 59 | Admitting: Psychiatry

## 2021-06-05 ENCOUNTER — Other Ambulatory Visit (HOSPITAL_BASED_OUTPATIENT_CLINIC_OR_DEPARTMENT_OTHER): Payer: Self-pay

## 2021-06-05 DIAGNOSIS — F411 Generalized anxiety disorder: Secondary | ICD-10-CM | POA: Diagnosis not present

## 2021-06-05 MED ORDER — AMPHETAMINE-DEXTROAMPHETAMINE 15 MG PO TABS
15.0000 mg | ORAL_TABLET | Freq: Two times a day (BID) | ORAL | 0 refills | Status: DC
  Filled 2021-06-05: qty 50, 25d supply, fill #0
  Filled 2021-06-05: qty 10, 5d supply, fill #0

## 2021-06-05 NOTE — Progress Notes (Signed)
?    [facility] Counselor/Therapist Progress Note ? ?Patient ID: Wendy Maynard, MRN: [id number],   ? ?Date: 06/05/2021 ? ?Time Spent: 58 minutes  ? ?Treatment Type: Individual Therapy ? ?Reported Symptoms: "really anxious", sad ? ?Mental Status Exam: ? ?Appearance:   Casual     ?Behavior:  Appropriate, Sharing, and Motivated  ?Motor:  Normal  ?Speech/Language:   Clear and Coherent  ?Affect:  Anxious, sad  ?Mood:  anxious and sad  ?Thought process:  goal directed  ?Thought content:    WNL  ?Sensory/Perceptual disturbances:    WNL  ?Orientation:  oriented to person, place, time/date, situation, day of week, month of year, year, and stated date of June 05, 2021  ?Attention:  Good  ?Concentration:  Good  ?Memory:  WNL  ?Fund of knowledge:   Good  ?Insight:    Good  ?Judgment:   Good  ?Impulse Control:  Good  ? ?Risk Assessment: ?Danger to Self:  No ?Self-injurious Behavior: No ?Danger to Others: No ?Duty to Warn:no ?Physical Aggression / Violence:No  ?Access to Firearms a concern: No  ?Gang Involvement:No  ? ?Subjective:  Patient in today reporting anxiety, sadness, and some frustration in home situation. Friend she had become closer to, has not communicated any more since before last session, "and not sure why." "I question what happened at times but trying to not dwell on it and let go of it." Worked with her social anxiety especially in groups. Doing better after recent anniversary of dad's death. Not feeling as overwhelmed. Increased issues living with her elderly mom and processed that more today as well as what may be helpful in communication, interacting, and setting healthier boundaries with mom. Trying to decrease worrying, understanding how it keeps her from fully enjoying things in the moment and tends to dread more. Working to be forgiving with others more, especially mom and sister. Using more positive self-talk recently but finding that is more difficult to maintain this around her mom. Some sleep issues  but "no worse" and "better at times". States that talking through these things helps her feel more calm and grounded. Working to have better boundaries with mom. ? ?Interventions: Solution-Oriented/Positive Psychology, Ego-Supportive, and Insight-Oriented ? ?Treatment Goals: ?Treatment goals remain on treatment as patient works with strategies to achieve her goals.  Progress is evaluated each session and documented in the "progress" section of note. ?Long term goal: ?Stabilize anxiety level while increasing ability to function on a daily basis. ?Short term goal: ?Identify major life conflicts from the past and present that form the basis for present anxiety. ?Strategies: ?Reinforced client's insights into the role of her past emotional pain and present anxiety.  Support patient's work in further resolving these issues ?  ?Diagnosis: ?  ICD-10-CM   ?1. Generalized anxiety disorder  F41.1   ?  ? ?Plan:  Patient today showing good motivation and actively participated in session as she worked on issues with her aging mom with whom she lives. Frustration, hurt, sadness, and anxiety processed today including how to have better boundaries with her mom that would set limits for her how patient allows herself to be treated by mom. Patient does sense she is becoming stronger as a person and beginning to to work towards having more effective boundaries within her own family and in other situations as needed.  Noticeable less self-doubt.  Some decrease in her fears of moving forward and wanting more connections to others.  Confidence improving some especially in decision making.  Working  on forgiveness regarding multiple family members in hopes of feeling more resolution and movement in a forward direction eventually.  Trying to be patient but also encouraging of herself in the meantime.  Encouraged patient in her practice of more positive behaviors including: Believing in herself more to make significant changes, getting  outside some daily and walking, reflect on her progress daily, using the gym located in her building at work for exercise, for every negative thought replace it with 2 positives, having some time each day just for herself, continue her journaling between sessions, practice consistent positive self talk and self-care based on conversations in session, interrupting negative/anxious thoughts and challenging them with more encouraging and empowering thoughts, interacting more with coworkers in positive ways, staying in the present and focusing on what she can control or change, staying in touch with people that are supportive and healthy for her, looking for more of what might go right versus wrong, looking for things that give her hope, intentionally looking more for positives versus negatives each day, search for the positives within herself and be able to name them, good nutrition, allowing for good sleep patterns, and recognize the strength she shows working with goal directed behaviors to move in a direction that supports her improved emotional health and overall wellbeing. ? ?Goal review and progress/challenges noted with patient. ? ?Next appointment within 2 weeks. ? ?This record has been created using [employer].  Chart creation errors have been sought, but may not always have been located and corrected.  Such creation errors do not reflect on the standard of medical care provided. ? ? ?Mathis Fare, LCSW ? ? ? ? ? ? ? ? ? ? ? ? ? ? ? ? ? ? ?

## 2021-06-08 ENCOUNTER — Ambulatory Visit: Payer: 59 | Admitting: Internal Medicine

## 2021-06-11 ENCOUNTER — Other Ambulatory Visit (HOSPITAL_BASED_OUTPATIENT_CLINIC_OR_DEPARTMENT_OTHER): Payer: Self-pay

## 2021-06-11 ENCOUNTER — Other Ambulatory Visit (HOSPITAL_COMMUNITY): Payer: Self-pay

## 2021-06-20 ENCOUNTER — Ambulatory Visit (INDEPENDENT_AMBULATORY_CARE_PROVIDER_SITE_OTHER): Payer: 59 | Admitting: Psychiatry

## 2021-06-20 DIAGNOSIS — F411 Generalized anxiety disorder: Secondary | ICD-10-CM

## 2021-06-20 NOTE — Progress Notes (Signed)
?    [facility] Counselor/Therapist Progress Note ? ?Patient ID: Wendy Maynard, MRN: [id number],   ? ?Date: 06/20/2021 ? ?Time Spent: 57 minutes  ? ?Treatment Type: Individual Therapy ? ?Reported Symptoms: anxiety, no depression, some anger recently ? ?Mental Status Exam: ? ?Appearance:   Neat     ?Behavior:  Appropriate, Sharing, and Motivated  ?Motor:  Normal  ?Speech/Language:   Clear and Coherent  ?Affect:  anxious  ?Mood:  anxious  ?Thought process:  goal directed  ?Thought content:    WNL  ?Sensory/Perceptual disturbances:    WNL  ?Orientation:  oriented to person, place, time/date, situation, day of week, month of year, year, and stated date of Jun 20, 2021  ?Attention:  Good  ?Concentration:  Good  ?Memory:  WNL  ?Fund of knowledge:   Good  ?Insight:    Good  ?Judgment:   Good  ?Impulse Control:  Good  ? ?Risk Assessment: ?Danger to Self:  No ?Self-injurious Behavior: No ?Danger to Others: No ?Duty to Warn:no ?Physical Aggression / Violence:No  ?Access to Firearms a concern: No  ?Gang Involvement:No  ? ?Subjective:  Patient in today reporting anxiety and frustration living with mother who tends to be judgmental and critical and patient finding this difficult to tolerate or stop. Current job pay is not sufficient for patient to pay for a place of her own so is living with mother currently. Has recently applied for 3 jobs and hopeful to be able to interview. Dreams about dad weekly, very vivid, and processed this more with patient as "I feel like that's how I visit with him since he died."  Reports no sadness recently and processed today more of her missing dad but noticing more of the positives of having had him in her life versus only focusing on his death or the painful events in the days she was a child and how he "improved and was a much better father by the time I was in my early 20's and I'm grateful for that." ?Continues working on forgiveness issues re: mom and sister discussed in prior sessions and  shared more today. "Sleep has been a little more ok".  Hopeful of having more effective/healthy boundaries with mom and that is a work in progress. "Trying to continue more positive self-talk and find helpful ways to cope."  ? ?Interventions: Solution-Oriented/Positive Psychology and Insight-Oriented ?  ?Treatment Goals: ?Treatment goals remain on treatment as patient works with strategies to achieve her goals.  Progress is evaluated each session and documented in the "progress" section of note. ?Long term goal: ?Stabilize anxiety level while increasing ability to function on a daily basis. ?Short term goal: ?Identify major life conflicts from the past and present that form the basis for present anxiety. ?Strategies: ?Reinforced client's insights into the role of her past emotional pain and present anxiety.  Support patient's work in further resolving these issues ? ?Diagnosis: ?  ICD-10-CM   ?1. Generalized anxiety disorder  F41.1   ?  ? ?Plan:  Patient today showing motivation and participated well in session working on issues between her and her mother with whom she lives, forgiveness issues involving both parents, as well as some unresolved grief regarding her dad's death but is actually feeling like she is progressing in that as she is having few times when she is focusing on the negatives and finds herself focusing more on the positives that occurred in his later life during the time patient had become an adult.  Hurt and  sadness not feeling as strong now.  Patient is recognizing her strength is increasing which is encouraging to her.  Less self-doubt and some improvement in self esteem.  Wanting to be able to move forward and have more and better connections with others and is good about trying different behaviors and ways of relating to people that are more positive . Encouraged patient in her practice of more positive behaviors daily including: Believing in herself more to make significant changes, moving  further away from the past and into the future, getting outside some daily and walking, reflecting on her own progress daily, using the gym located in her building at work for exercise, having some time each day just for herself, continue her journaling between sessions, practice consistent positive self talk and self-care based on conversations in session, interrupting negative/anxious thoughts and challenging them with more encouraging and empowering thoughts, interacting more with coworkers in positive ways, staying in the present and focusing on what she can control or change, staying in touch with people that are supportive and healthy for her, looking for more of what might go right versus wrong, looking for things that give her hope, intentionally looking more for positives versus negatives each day, search for the positives within herself and be able to name them, good nutrition, allowing for good sleep patterns, and recognize the strength that she shows working with goal directed behaviors to move in a direction that supports her improved emotional health and overall outlook. ? ?Goal review and progress/challenges noted with patient. ? ?Next appointment within 2 to 3 weeks. ? ?This record has been created using [employer].  Chart creation errors have been sought, but may not always have been located and corrected.  Such creation errors do not reflect on the standard of medical care provided. ? ? ?Mathis Fare, LCSW ? ? ? ? ? ? ? ? ? ? ? ? ? ? ? ? ? ? ?

## 2021-07-02 ENCOUNTER — Ambulatory Visit: Payer: 59 | Admitting: Psychiatry

## 2021-07-04 ENCOUNTER — Encounter: Payer: Self-pay | Admitting: Physician Assistant

## 2021-07-04 ENCOUNTER — Ambulatory Visit: Payer: 59 | Attending: Internal Medicine | Admitting: Physician Assistant

## 2021-07-04 ENCOUNTER — Other Ambulatory Visit (HOSPITAL_BASED_OUTPATIENT_CLINIC_OR_DEPARTMENT_OTHER): Payer: Self-pay

## 2021-07-04 VITALS — BP 124/88 | HR 84 | Wt 272.6 lb

## 2021-07-04 DIAGNOSIS — R7303 Prediabetes: Secondary | ICD-10-CM

## 2021-07-04 DIAGNOSIS — R030 Elevated blood-pressure reading, without diagnosis of hypertension: Secondary | ICD-10-CM

## 2021-07-04 DIAGNOSIS — M62838 Other muscle spasm: Secondary | ICD-10-CM | POA: Diagnosis not present

## 2021-07-04 MED ORDER — CYCLOBENZAPRINE HCL 10 MG PO TABS
10.0000 mg | ORAL_TABLET | Freq: Every day | ORAL | 2 refills | Status: DC | PRN
  Filled 2021-07-04: qty 30, 30d supply, fill #0
  Filled 2021-08-30: qty 30, 30d supply, fill #1
  Filled 2021-10-30: qty 30, 30d supply, fill #2

## 2021-07-04 NOTE — Progress Notes (Signed)
Patient ID: Wendy Maynard, female   DOB: [birth year], 46 y.o.   MRN: [id number]   Wendy Maynard, is a 46 y.o. female  [id number]  [id number]  DOB - 08-03-1975  Chief Complaint  Patient presents with   Hypertension       Subjective:   Wendy Maynard is a 46 y.o. female here today for recheck BP.  She is doing well.  BP OOO has been in 120s/80s.  She has been doing hit or miss on diet changes.  Today she has had crackers, cheese-its and M&Ms.    She is stable and keeps f/up on psych appts.  No SI/HI.    She wants to get FMLA papers filled out for occasional absences with migraine HA but she has not gotten the forms from her job.   No problems updated.  ALLERGIES: Allergies  Allergen Reactions   Ciprofloxacin Hives and Other (See Comments)    Started in arm as soon as drug started.   Clindamycin/Lincomycin Diarrhea and Other (See Comments)    Causes excessive diarrhea   Vicodin [Hydrocodone-Acetaminophen] Itching and Nausea And Vomiting   Penicillins Rash and Other (See Comments)    Occurred at 46 years old Has patient had a PCN reaction causing immediate rash, facial/tongue/throat swelling, SOB or lightheadedness with hypotension: yes Has patient had a PCN reaction causing severe rash involving mucus membranes or skin necrosis: no Has patient had a PCN reaction that required hospitalization no Has patient had a PCN reaction occurring within the last 10 years:no If all of the above answers are "NO", then may proceed with Cephalosporin use.    Sulfa Antibiotics Rash    PAST MEDICAL HISTORY: Past Medical History:  Diagnosis Date   Abdominal pain in female patient 11/2012   Anxiety    Depression    Diabetes mellitus without complication (HCC)    Migraines    Polycystic ovarian disease     MEDICATIONS AT HOME: Prior to Admission medications   Medication Sig Start Date End Date Taking? Authorizing Provider  ALPRAZolam Prudy Feeler) 0.5 MG tablet Take 1 tablet (0.5 mg total) by  mouth 3 (three) times daily as needed for anxiety. 04/10/21  Yes Corie Chiquito, PMHNP  amphetamine-dextroamphetamine (ADDERALL) 15 MG tablet Take 1 tablet by mouth 2 (two) times daily. 06/05/21 07/05/21 Yes Corie Chiquito, PMHNP  amphetamine-dextroamphetamine (ADDERALL) 15 MG tablet Take 1 tablet by mouth 2 (two) times daily. 06/05/21 07/08/21 Yes Corie Chiquito, PMHNP  ARIPiprazole (ABILIFY) 5 MG tablet Take 1/2-1 po qd 04/10/21  Yes Corie Chiquito, PMHNP  Ascorbic Acid (VITAMIN C) 100 MG tablet Take 100 mg by mouth daily.   Yes [provider]  diclofenac (VOLTAREN) 75 MG EC tablet 2 (two) times daily as needed. 07/14/18  Yes [provider]  estradiol (ESTRACE) 1 MG tablet Take 1 tablet (1 mg total) by mouth daily. 03/29/21  Yes Marcine Matar, MD  fexofenadine (ALLEGRA) 60 MG tablet Take 60 mg by mouth 2 (two) times daily as needed.   Yes [provider]  glycopyrrolate (ROBINUL) 1 MG tablet Take 1 tablet (1 mg total) by mouth 2 (two) times daily. 04/04/21  Yes Marcine Matar, MD  sertraline (ZOLOFT) 100 MG tablet Take 1.5 tablets (150 mg total) by mouth daily. 04/10/21 07/11/21 Yes Corie Chiquito, PMHNP  zolpidem (AMBIEN) 10 MG tablet Take 1 tablet (10 mg total) by mouth at bedtime as needed for sleep. 02/16/21  Yes Corie Chiquito, PMHNP  amphetamine-dextroamphetamine (ADDERALL) 15 MG tablet Take 1 tablet  by mouth 2 (two) times daily. 04/10/21 05/12/21  Corie Chiquito, PMHNP  cyclobenzaprine (FLEXERIL) 10 MG tablet Take 1 tablet (10 mg total) by mouth daily as needed. 07/04/21   Daneshia Tavano, Marzella Schlein, PA-C    ROS: Neg HEENT Neg resp Neg cardiac Neg GI Neg GU Neg MS Neg psych  Objective:   Vitals:   07/04/21 1410  BP: 124/88  Pulse: 84  SpO2: 97%  Weight: 272 lb 9.6 oz (123.7 kg)   Exam General appearance : Awake, alert, not in any distress. Speech Clear. Not toxic looking HEENT: Atraumatic and Normocephalic  Chest: Good air entry bilaterally, CTAB.  No  rales/rhonchi/wheezing CVS: S1 S2 regular, no murmurs.  Extremities: B/L Lower Ext shows no edema, both legs are warm to touch Neurology: Awake alert, and oriented X 3, CN II-XII intact, Non focal Skin: No Rash  Data Review Lab Results  Component Value Date   HGBA1C 6.0 (H) 03/29/2021   HGBA1C 5.4 04/28/2016    Assessment & Plan   1. Neck muscle spasm Needed RF - cyclobenzaprine (FLEXERIL) 10 MG tablet; Take 1 tablet (10 mg total) by mouth daily as needed.  Dispense: 30 tablet; Refill: 2  2. Prediabetes I have had a lengthy discussion and provided education about insulin resistance and the intake of too much sugar/refined carbohydrates.  I have advised the patient to work at a goal of eliminating sugary drinks, candy, desserts, sweets, refined sugars, processed foods, and white carbohydrates.  The patient expresses understanding.    3. Elevated blood pressure reading without diagnosis of hypertension BP is good today.  Work on diet/exercise.      Return for fasting blood work and to see Dr Laural Benes for [facility] papers. asap  The patient was given clear instructions to go to ER or return to medical center if symptoms don't improve, worsen or new problems develop. The patient verbalized understanding. The patient was told to call to get lab results if they haven't heard anything in the next week.      Wendy Co, PA-C [facility] [place], [place] [phone]   07/04/2021, 2:25 PM

## 2021-07-04 NOTE — Patient Instructions (Signed)
Follow low fat, low sugar diet.  Drink 80-100 ounces water daily.

## 2021-07-05 ENCOUNTER — Other Ambulatory Visit (HOSPITAL_BASED_OUTPATIENT_CLINIC_OR_DEPARTMENT_OTHER): Payer: Self-pay

## 2021-07-06 ENCOUNTER — Other Ambulatory Visit (HOSPITAL_BASED_OUTPATIENT_CLINIC_OR_DEPARTMENT_OTHER): Payer: Self-pay

## 2021-07-06 ENCOUNTER — Telehealth (INDEPENDENT_AMBULATORY_CARE_PROVIDER_SITE_OTHER): Payer: 59 | Admitting: Psychiatry

## 2021-07-06 ENCOUNTER — Encounter: Payer: Self-pay | Admitting: Psychiatry

## 2021-07-06 DIAGNOSIS — F3342 Major depressive disorder, recurrent, in full remission: Secondary | ICD-10-CM

## 2021-07-06 DIAGNOSIS — F4310 Post-traumatic stress disorder, unspecified: Secondary | ICD-10-CM

## 2021-07-06 DIAGNOSIS — F5101 Primary insomnia: Secondary | ICD-10-CM

## 2021-07-06 DIAGNOSIS — F900 Attention-deficit hyperactivity disorder, predominantly inattentive type: Secondary | ICD-10-CM | POA: Diagnosis not present

## 2021-07-06 MED ORDER — SERTRALINE HCL 100 MG PO TABS
200.0000 mg | ORAL_TABLET | Freq: Every day | ORAL | 0 refills | Status: DC
  Filled 2021-07-06 – 2021-08-30 (×2): qty 180, 90d supply, fill #0

## 2021-07-06 MED ORDER — ZOLPIDEM TARTRATE 10 MG PO TABS
10.0000 mg | ORAL_TABLET | Freq: Every evening | ORAL | 5 refills | Status: DC | PRN
  Filled 2021-07-06 – 2021-08-03 (×2): qty 30, 30d supply, fill #0
  Filled 2021-08-30: qty 30, 30d supply, fill #1
  Filled 2021-10-01: qty 24, 24d supply, fill #2
  Filled 2021-10-01: qty 6, 6d supply, fill #2
  Filled 2021-10-30: qty 30, 30d supply, fill #3
  Filled 2021-11-27: qty 30, 30d supply, fill #4

## 2021-07-06 MED ORDER — ALPRAZOLAM 0.5 MG PO TABS
0.5000 mg | ORAL_TABLET | Freq: Three times a day (TID) | ORAL | 5 refills | Status: DC | PRN
  Filled 2021-07-06: qty 90, 30d supply, fill #0
  Filled 2021-07-26 – 2021-08-03 (×3): qty 90, 30d supply, fill #1
  Filled 2021-08-30: qty 90, 30d supply, fill #2
  Filled 2021-10-01: qty 90, 30d supply, fill #3
  Filled 2021-10-30: qty 90, 30d supply, fill #4
  Filled 2021-11-27: qty 90, 30d supply, fill #5

## 2021-07-06 MED ORDER — ARIPIPRAZOLE 5 MG PO TABS
ORAL_TABLET | ORAL | 1 refills | Status: DC
  Filled 2021-07-06: qty 90, fill #0

## 2021-07-06 MED ORDER — AMPHETAMINE-DEXTROAMPHETAMINE 15 MG PO TABS
15.0000 mg | ORAL_TABLET | Freq: Two times a day (BID) | ORAL | 0 refills | Status: DC
  Filled 2021-08-03: qty 60, 30d supply, fill #0

## 2021-07-06 NOTE — Progress Notes (Unsigned)
Wendy Maynard [id number] April 24, 1975 46 y.o.  Virtual Visit via Video Note  I connected with pt @ on 07/06/21 at 12:45 PM EDT by a video enabled telemedicine application and verified that I am speaking with the correct person using two identifiers.   I discussed the limitations of evaluation and management by telemedicine and the availability of in person appointments. The patient expressed understanding and agreed to proceed.  I discussed the assessment and treatment plan with the patient. The patient was provided an opportunity to ask questions and all were answered. The patient agreed with the plan and demonstrated an understanding of the instructions.   The patient was advised to call back or seek an in-person evaluation if the symptoms worsen or if the condition fails to improve as anticipated.  I provided 30 minutes of non-face-to-face time during this encounter.  The patient was located at work.  The provider was located at [facility].   Corie Chiquito, PMHNP   Subjective:   Patient ID:  Wendy Maynard is a 46 y.o. (DOB 03-29-1975) female.  Chief Complaint:  Chief Complaint  Patient presents with   Anxiety    HPI Wendy Maynard presents for follow-up of anxiety, depression, ADHD, and insomnia. She reports that increase in Sertraline "seems to be going ok." Denies any significant improvement in anxiety with Sertraline. She reports that she is sleeping better. She reports, "I've still been pretty anxious." She reports that she continues to have exaggerated startle response. She reports that she has been worrying and "always waiting for the next shoe to drop." She reports that she has had a few panic attacks and "don't know when they're coming." She had a panic attack after being cut off in traffic. Has shortness of breath at times with anxiety. Denies any restlessness. She notices occ irritability and frustration. Some catastrophic thoughts at times. She reports difficulty relaxing  and is nervous with downtime. Some rumination. Occ intrusive memories. Some hypervigilance. She reports that sometimes feels "I need to get back inside. I am not safe." She reports panic attacks several times a week.   Denies depressed mood. Energy and motivation have been "fine." Sleep is adequate and getting 7-8 hours of sleep a night. Appetite has been ok. Has lost about 8 lbs. She has been trying to cut out some foods. Concentration has been ok. Denies anhedonia. Had been reading. Denies SI.   Has been talking with someone new. Started walking her dog daily and enjoys this. Has been doing [employer]. Spends time with cousins.   Using Xanax prn most days.   Sometimes does not take Adderall on the weekends.  Past Psychiatric Medication Trials: Abilify- Helped mood. Wt gain.  Sertraline-Helpful  Lexapro- Felt tired and irritable Prozac Cymbalta- adverse effects Wellbutrin XL- Insomnia Trazodone-ineffective Ambien Xanax Lorazepam Adderall  Adderall XR- Insomnia, irritability, dry mouth  Review of Systems:  Review of Systems  Musculoskeletal:  Negative for gait problem.  Neurological:  Positive for headaches. Negative for tremors.  Psychiatric/Behavioral:         Please refer to HPI   Medications: I have reviewed the patient's current medications.  Current Outpatient Medications  Medication Sig Dispense Refill   Ascorbic Acid (VITAMIN C) 100 MG tablet Take 100 mg by mouth daily.     cyclobenzaprine (FLEXERIL) 10 MG tablet Take 1 tablet (10 mg total) by mouth daily as needed. 30 tablet 2   estradiol (ESTRACE) 1 MG tablet Take 1 tablet (1 mg total) by mouth daily. 30  tablet 5   glycopyrrolate (ROBINUL) 1 MG tablet Take 1 tablet (1 mg total) by mouth 2 (two) times daily. 60 tablet 4   ALPRAZolam (XANAX) 0.5 MG tablet Take 1 tablet (0.5 mg total) by mouth 3 (three) times daily as needed for anxiety. 90 tablet 5   amphetamine-dextroamphetamine (ADDERALL) 15 MG tablet Take 1 tablet  by mouth 2 (two) times daily. 60 tablet 0   amphetamine-dextroamphetamine (ADDERALL) 15 MG tablet Take 1 tablet by mouth 2 (two) times daily. 60 tablet 0   [START ON 08/02/2021] amphetamine-dextroamphetamine (ADDERALL) 15 MG tablet Take 1 tablet by mouth 2 (two) times daily. 60 tablet 0   ARIPiprazole (ABILIFY) 5 MG tablet Take 1/2-1 po qd 90 tablet 1   fexofenadine (ALLEGRA) 60 MG tablet Take 60 mg by mouth 2 (two) times daily as needed. (Patient not taking: Reported on 07/06/2021)     sertraline (ZOLOFT) 100 MG tablet Take 2 tablets (200 mg total) by mouth daily. 180 tablet 0   [START ON 08/02/2021] zolpidem (AMBIEN) 10 MG tablet Take 1 tablet (10 mg total) by mouth at bedtime as needed for sleep. 30 tablet 5   No current facility-administered medications for this visit.    Medication Side Effects: None  Denies involuntary movements  Allergies:  Allergies  Allergen Reactions   Ciprofloxacin Hives and Other (See Comments)    Started in arm as soon as drug started.   Clindamycin/Lincomycin Diarrhea and Other (See Comments)    Causes excessive diarrhea   Vicodin [Hydrocodone-Acetaminophen] Itching and Nausea And Vomiting   Penicillins Rash and Other (See Comments)    Occurred at 46 years old Has patient had a PCN reaction causing immediate rash, facial/tongue/throat swelling, SOB or lightheadedness with hypotension: yes Has patient had a PCN reaction causing severe rash involving mucus membranes or skin necrosis: no Has patient had a PCN reaction that required hospitalization no Has patient had a PCN reaction occurring within the last 10 years:no If all of the above answers are "NO", then may proceed with Cephalosporin use.    Sulfa Antibiotics Rash    Past Medical History:  Diagnosis Date   Abdominal pain in female patient 11/2012   Anxiety    Depression    Diabetes mellitus without complication (HCC)    Migraines    Polycystic ovarian disease     Family History  Problem  Relation Age of Onset   Depression Mother        in response to grief/loss of husband   Hypertension Father        Died sudenly at age 54. Reports father may have had Aspergers   Depression Sister    Anxiety disorder Sister    ADD / ADHD Sister    ADD / ADHD Cousin    Asperger's syndrome Cousin     Social History   Socioeconomic History   Marital status: Single    Spouse name: Not on file   Number of children: 0   Years of education: Not on file   Highest education level: Master's degree (e.g., MA, MS, MEng, MEd, MSW, MBA)  Occupational History   Occupation: Retail buyer at [employer]  Tobacco Use   Smoking status: Former    Types: Cigarettes    Quit date: 01/2021    Years since quitting: 0.4   Smokeless tobacco: Never   Tobacco comments:    plans to start nicotine gum  Vaping Use   Vaping Use: Never used  Substance and Sexual Activity  Alcohol use: Yes    Comment: Occasionally 1-2 x/mth   Drug use: No    Types: Marijuana    Comment: Last use 04/27/16   Sexual activity: Not Currently    Birth control/protection: Condom  Other Topics Concern   Not on file  Social History Narrative   Not on file   Social Determinants of Health   Financial Resource Strain: Not on file  Food Insecurity: Not on file  Transportation Needs: Not on file  Physical Activity: Not on file  Stress: Not on file  Social Connections: Not on file  Intimate Partner Violence: Not on file    Past Medical History, Surgical history, Social history, and Family history were reviewed and updated as appropriate.   Please see review of systems for further details on the patient's review from today.   Objective:   Physical Exam:  BP 124/88   Pulse 84   Wt 272 lb (123.4 kg)   LMP  (LMP Unknown)   BMI 42.60 kg/m   Physical Exam Neurological:     Mental Status: She is alert and oriented to person, place, and time.     Cranial Nerves: No dysarthria.  Psychiatric:        Attention and  Perception: Attention and perception normal.        Mood and Affect: Mood is anxious.        Speech: Speech normal.        Behavior: Behavior is cooperative.        Thought Content: Thought content normal. Thought content is not paranoid or delusional. Thought content does not include homicidal or suicidal ideation. Thought content does not include homicidal or suicidal plan.        Cognition and Memory: Cognition and memory normal.        Judgment: Judgment normal.     Comments: Insight intact    AIMS=0  Lab Review:     Component Value Date/Time   NA 141 03/29/2021 1514   K 4.2 03/29/2021 1514   CL 103 03/29/2021 1514   CO2 20 03/29/2021 1514   GLUCOSE 83 03/29/2021 1514   GLUCOSE 90 04/28/2016 1836   BUN 13 03/29/2021 1514   CREATININE 0.69 03/29/2021 1514   CALCIUM 9.5 03/29/2021 1514   PROT 7.3 03/29/2021 1514   ALBUMIN 4.9 (H) 03/29/2021 1514   AST 15 03/29/2021 1514   ALT 14 03/29/2021 1514   ALKPHOS 114 03/29/2021 1514   BILITOT <0.2 03/29/2021 1514   GFRNONAA >60 04/28/2016 1836   GFRAA >60 04/28/2016 1836       Component Value Date/Time   WBC 10.1 03/29/2021 1514   WBC 9.4 04/28/2016 1836   RBC 5.10 03/29/2021 1514   RBC 4.61 04/28/2016 1836   HGB 12.1 03/29/2021 1514   HCT 37.9 03/29/2021 1514   PLT 263 03/29/2021 1514   MCV 74 (L) 03/29/2021 1514   MCH 23.7 (L) 03/29/2021 1514   MCH 27.1 04/28/2016 1836   MCHC 31.9 03/29/2021 1514   MCHC 33.2 04/28/2016 1836   RDW 15.4 03/29/2021 1514   LYMPHSABS 2.1 02/26/2013 0154   MONOABS 0.6 02/26/2013 0154   EOSABS 0.2 02/26/2013 0154   BASOSABS 0.0 02/26/2013 0154    No results found for: POCLITH, LITHIUM   No results found for: PHENYTOIN, PHENOBARB, VALPROATE, CBMZ   .res Assessment: Plan:    Pt seen for 30 minutes and time spent discussing treatment options for anxiety. Recommend increasing Sertraline to 200 mg po qd  to determine if anxiety s/s may be better controlled at this dose. Also discussed  considering augmentation with Buspar in the future if needed.  Continue Abilify 5 mg daily for depression.  Continue Alprazolam 0.5 mg po TID prn anxiety.  Continue Adderall 15 mg po BID for ADHD.  Continue Ambien 10 mg po QHS prn insomnia.  Recommend continuing therapy with Rockne Menghini, LCSW.  Pt to follow-up with this provider in 6-8 weeks or sooner if clinically indicated.  Patient advised to contact office with any questions, adverse effects, or acute worsening in signs and symptoms.   Wendy Maynard was seen today for anxiety.  Diagnoses and all orders for this visit:  Recurrent major depressive disorder, in full remission (HCC) -     sertraline (ZOLOFT) 100 MG tablet; Take 2 tablets (200 mg total) by mouth daily. -     ARIPiprazole (ABILIFY) 5 MG tablet; Take 1/2-1 po qd  Posttraumatic stress disorder -     sertraline (ZOLOFT) 100 MG tablet; Take 2 tablets (200 mg total) by mouth daily. -     ALPRAZolam (XANAX) 0.5 MG tablet; Take 1 tablet (0.5 mg total) by mouth 3 (three) times daily as needed for anxiety.  Attention deficit hyperactivity disorder (ADHD), predominantly inattentive type -     amphetamine-dextroamphetamine (ADDERALL) 15 MG tablet; Take 1 tablet by mouth 2 (two) times daily.  Primary insomnia -     zolpidem (AMBIEN) 10 MG tablet; Take 1 tablet (10 mg total) by mouth at bedtime as needed for sleep.     Please see After Visit Summary for patient specific instructions.  Future Appointments  Date Time Provider Department Center  07/16/2021  5:00 PM Mathis Fare, LCSW [facility] None  07/31/2021  5:00 PM Mathis Fare, LCSW [facility] None  08/10/2021  9:50 AM Marcine Matar, MD [facility] None  08/15/2021  5:00 PM Mathis Fare, LCSW [facility] None  08/29/2021  5:00 PM Mathis Fare, LCSW [facility] None  09/10/2021  5:00 PM Mathis Fare, LCSW [facility] None    No orders of the defined types were placed in this encounter.     -------------------------------

## 2021-07-10 ENCOUNTER — Ambulatory Visit: Payer: 59 | Admitting: Adult Health

## 2021-07-16 ENCOUNTER — Ambulatory Visit: Payer: 59 | Admitting: Psychiatry

## 2021-07-16 DIAGNOSIS — F411 Generalized anxiety disorder: Secondary | ICD-10-CM | POA: Diagnosis not present
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
## 2021-07-26 ENCOUNTER — Encounter (HOSPITAL_BASED_OUTPATIENT_CLINIC_OR_DEPARTMENT_OTHER): Payer: Self-pay

## 2021-07-26 ENCOUNTER — Other Ambulatory Visit (HOSPITAL_BASED_OUTPATIENT_CLINIC_OR_DEPARTMENT_OTHER): Payer: Self-pay

## 2021-07-30 ENCOUNTER — Other Ambulatory Visit (HOSPITAL_BASED_OUTPATIENT_CLINIC_OR_DEPARTMENT_OTHER): Payer: Self-pay

## 2021-07-31 ENCOUNTER — Ambulatory Visit (INDEPENDENT_AMBULATORY_CARE_PROVIDER_SITE_OTHER): Payer: 59 | Admitting: Psychiatry

## 2021-07-31 DIAGNOSIS — F411 Generalized anxiety disorder: Secondary | ICD-10-CM | POA: Diagnosis not present

## 2021-07-31 NOTE — Progress Notes (Signed)
[facility] Counselor/Therapist Progress Note  Patient ID: Wendy Maynard, MRN: [id number],    Date: 07/31/2021  Time Spent: 57 minutes   Treatment Type: Individual Therapy  Reported Symptoms: anxiety, some irritability but not with people  Mental Status Exam:  Appearance:   Casual     Behavior:  Appropriate, Sharing, and Motivated  Motor:  Normal  Speech/Language:   Clear and Coherent  Affect:  Depressed and anxious  Mood:  anxious and depressed  Thought process:  goal directed  Thought content:    WNL  Sensory/Perceptual disturbances:    WNL  Orientation:  oriented to person, place, time/date, situation, day of week, month of year, year, and stated date of July 31, 2021  Attention:  Good  Concentration:  Good  Memory:  WNL  Fund of knowledge:   Good  Insight:    Good  Judgment:   Good  Impulse Control:  Good   Risk Assessment: Danger to Self:  No Self-injurious Behavior: No Danger to Others: No Duty to Warn:no Physical Aggression / Violence:No  Access to Firearms a concern: No  Gang Involvement:No   Subjective:  Patient in today reporting anxiety and some depression, mostly re: relationship with mom with who she lives, "driving in heavy traffic around town."  Discussed challenges more recently in living with mom and patient not really wanting to leave right now but has wondered about possibly living on her own but not sure if living alone would work for her or not. Things are often "up and down" living with mom as she describes in session today, and we discussed what may help patient communicate in certain situations and also feel heard.   Interventions: Solution-Oriented/Positive Psychology, Ego-Supportive, and Insight-Oriented   Treatment Goals: Treatment goals remain on treatment as patient works with strategies to achieve her goals.  Progress is evaluated each session and documented in the "progress" section of note. Long term goal: Stabilize anxiety level  while increasing ability to function on a daily basis. Short term goal: Identify major life conflicts from the past and present that form the basis for present anxiety. Strategies: Reinforced client's insights into the role of her past emotional pain and present anxiety.  Support patient's work in further resolving these issues   Diagnosis:   ICD-10-CM   1. Generalized anxiety disorder  F41.1      Plan: Patient today showing good participation in session working on some issues with mother with whom she lives, and issues for patient personally. States her sleep is some better more recently as she continues working on some history of conflicts in her life (past and present) and seems to be gaining some strength in being able to say "no" to others without having to explain or justify.  Mood overall improving, finding more strength within herself.  Some decrease in self-doubt, reports feeling that she is making progress and likes not being pushed, but encouraged as she continues her journey towards better overall emotional health for now and into the future.  Encouraged patient in her practice of more positive behaviors including: Continue moving further away from her past and more into the present and into the future as she works with her goals, getting outside some daily and walking, believing in herself and her ability to make significant changes, reflect on her own progress often, create some downtime for herself each day, continue journaling between sessions, positive self talk, interrupting negative/anxious thoughts and challenging them to replace with more positive/encouraging and  reality based thoughts, interacting more with coworkers in positive ways, having more hopefulness, intentionally looking for more positives versus negatives daily, searching for the positives within herself and be able to name them, allowing for good sleep patterns, and realize the strength she shows working with goal  directed behaviors to move in a direction that supports her improved emotional health and overall outlook  Goal review and progress/challenges noted with patient.  Next appointment within 2 to 3 weeks.  This record has been created using [employer].  Chart creation errors have been sought, but may not always have been located and corrected.  Such creation errors do not reflect on the standard of medical care provided.   Mathis Fare, LCSW

## 2021-08-03 ENCOUNTER — Other Ambulatory Visit (HOSPITAL_BASED_OUTPATIENT_CLINIC_OR_DEPARTMENT_OTHER): Payer: Self-pay

## 2021-08-10 ENCOUNTER — Encounter (HOSPITAL_BASED_OUTPATIENT_CLINIC_OR_DEPARTMENT_OTHER): Payer: Self-pay

## 2021-08-10 ENCOUNTER — Encounter: Payer: Self-pay | Admitting: Internal Medicine

## 2021-08-10 ENCOUNTER — Other Ambulatory Visit (HOSPITAL_BASED_OUTPATIENT_CLINIC_OR_DEPARTMENT_OTHER): Payer: Self-pay

## 2021-08-10 ENCOUNTER — Ambulatory Visit: Payer: 59 | Attending: Internal Medicine | Admitting: Internal Medicine

## 2021-08-10 VITALS — BP 119/86 | HR 91 | Wt 279.4 lb

## 2021-08-10 DIAGNOSIS — G43109 Migraine with aura, not intractable, without status migrainosus: Secondary | ICD-10-CM | POA: Diagnosis not present

## 2021-08-10 DIAGNOSIS — Z6841 Body Mass Index (BMI) 40.0 and over, adult: Secondary | ICD-10-CM

## 2021-08-10 DIAGNOSIS — E782 Mixed hyperlipidemia: Secondary | ICD-10-CM | POA: Diagnosis not present

## 2021-08-10 MED ORDER — SEMAGLUTIDE-WEIGHT MANAGEMENT 0.25 MG/0.5ML ~~LOC~~ SOAJ
0.2500 mg | SUBCUTANEOUS | 0 refills | Status: DC
  Filled 2021-08-10: qty 2, 28d supply, fill #0

## 2021-08-10 NOTE — Progress Notes (Signed)
Patient ID: Wendy Maynard, female    DOB: [birth year]  MRN: [id number]  CC: Weight Gain   Subjective: Wendy Maynard is a 46 y.o. female who presents for chronic ds management Her concerns today include:  Patient with history of OSA on CPAP, obesity, prediabetes, HL,MDD, ADHD, PTSD (plugged in with behavioral health services), migraines with aura, HRT on estradiol and glycopyrrolate   BP elev on last visit.  Better when she saw PA last month.  It was also checked by her behavioral health specialist 3 weeks after she saw me and it was normal. DASH diet advised and she has been doing so. Checks BP once a wk with automated arm device.  Ports headache SBP in the 120s and DBP highest 81.  Obesity/PreDM:  A1C on last visit was 6, LDL cholesterol was also elevated at 140. Still walks dogs in evenings for 20 mins and walks on her break at work Still eating good.  Cut out sodas and limits sweets. Wanting to be tried with medication to help with weight loss.  Migraines not as freq since hysterectomy.  Gets episode about once a mth.  Would like to get FMLA to cover when she has to sit out of work for migraine episodes.  Usually has to leave work when she states getting an aura.  Takes Excedrine Migraine and goes to bed and that takes care of it. Would like it to be 1-2x/mth for a day as needed  Did not get MMG.  Was sick that day.  Nervous about getting it because she had FNB in past LT breast.  Turned out ok Patient Active Problem List   Diagnosis Date Noted   Migraine with aura and without status migrainosus, not intractable 03/29/2021   Chronic pain of right knee 03/29/2021   Morbid obesity with body mass index (BMI) of 40.0 to 44.9 in adult (HCC) 03/29/2021   OSA on CPAP 03/29/2021   Postmenopausal hormone replacement therapy 03/29/2021   Neck muscle spasm 03/29/2021   Elevated blood pressure reading without diagnosis of hypertension 03/29/2021   Snoring 12/07/2020   Excessive sleepiness  12/07/2020   Fatigue 12/07/2020   Insomnia 12/07/2020   Sleep paralysis 12/07/2020   ADHD, predominantly inattentive type 02/06/2018   Posttraumatic stress disorder 02/06/2018   MDD (major depressive disorder), recurrent severe, without psychosis (HCC) 04/28/2016   Chronic calculous cholecystitis 03/23/2013   Cholecystitis 02/26/2013     Current Outpatient Medications on File Prior to Visit  Medication Sig Dispense Refill   ALPRAZolam (XANAX) 0.5 MG tablet Take 1 tablet (0.5 mg total) by mouth 3 (three) times daily as needed for anxiety. 90 tablet 5   amphetamine-dextroamphetamine (ADDERALL) 15 MG tablet Take 1 tablet by mouth 2 (two) times daily. 60 tablet 0   ARIPiprazole (ABILIFY) 5 MG tablet Take 1/2-1 po qd 90 tablet 1   Ascorbic Acid (VITAMIN C) 100 MG tablet Take 100 mg by mouth daily.     cyclobenzaprine (FLEXERIL) 10 MG tablet Take 1 tablet (10 mg total) by mouth daily as needed. 30 tablet 2   estradiol (ESTRACE) 1 MG tablet Take 1 tablet (1 mg total) by mouth daily. 30 tablet 5   fexofenadine (ALLEGRA) 60 MG tablet Take 60 mg by mouth 2 (two) times daily as needed.     glycopyrrolate (ROBINUL) 1 MG tablet Take 1 tablet (1 mg total) by mouth 2 (two) times daily. 60 tablet 4   sertraline (ZOLOFT) 100 MG tablet Take 2 tablets (200 mg  total) by mouth daily. 180 tablet 0   zolpidem (AMBIEN) 10 MG tablet Take 1 tablet (10 mg total) by mouth at bedtime as needed for sleep. 30 tablet 5   amphetamine-dextroamphetamine (ADDERALL) 15 MG tablet Take 1 tablet by mouth 2 (two) times daily. 60 tablet 0   amphetamine-dextroamphetamine (ADDERALL) 15 MG tablet Take 1 tablet by mouth 2 (two) times daily. 60 tablet 0   No current facility-administered medications on file prior to visit.    Allergies  Allergen Reactions   Ciprofloxacin Hives and Other (See Comments)    Started in arm as soon as drug started.   Clindamycin/Lincomycin Diarrhea and Other (See Comments)    Causes excessive  diarrhea   Vicodin [Hydrocodone-Acetaminophen] Itching and Nausea And Vomiting   Penicillins Rash and Other (See Comments)    Occurred at 47 years old Has patient had a PCN reaction causing immediate rash, facial/tongue/throat swelling, SOB or lightheadedness with hypotension: yes Has patient had a PCN reaction causing severe rash involving mucus membranes or skin necrosis: no Has patient had a PCN reaction that required hospitalization no Has patient had a PCN reaction occurring within the last 10 years:no If all of the above answers are "NO", then may proceed with Cephalosporin use.    Sulfa Antibiotics Rash    Social History   Socioeconomic History   Marital status: Single    Spouse name: Not on file   Number of children: 0   Years of education: Not on file   Highest education level: Master's degree (e.g., MA, MS, MEng, MEd, MSW, MBA)  Occupational History   Occupation: Retail buyer at [employer]  Tobacco Use   Smoking status: Former    Types: Cigarettes    Quit date: 01/2021    Years since quitting: 0.5   Smokeless tobacco: Never   Tobacco comments:    plans to start nicotine gum  Vaping Use   Vaping Use: Never used  Substance and Sexual Activity   Alcohol use: Yes    Comment: Occasionally 1-2 x/mth   Drug use: No    Types: Marijuana    Comment: Last use 04/27/16   Sexual activity: Not Currently    Birth control/protection: Condom  Other Topics Concern   Not on file  Social History Narrative   Not on file   Social Determinants of Health   Financial Resource Strain: Not on file  Food Insecurity: Not on file  Transportation Needs: Not on file  Physical Activity: Not on file  Stress: Not on file  Social Connections: Not on file  Intimate Partner Violence: Not on file    Family History  Problem Relation Age of Onset   Depression Mother        in response to grief/loss of husband   Hypertension Father        Died sudenly at age 81. Reports father may have had  Aspergers   Depression Sister    Anxiety disorder Sister    ADD / ADHD Sister    ADD / ADHD Cousin    Asperger's syndrome Cousin     Past Surgical History:  Procedure Laterality Date   CHOLECYSTECTOMY N/A 02/26/2013   Procedure: LAPAROSCOPIC CHOLECYSTECTOMY WITH INTRAOPERATIVE CHOLANGIOGRAM;  Surgeon: Adolph Pollack, MD;  Location: [facility];  Service: General;  Laterality: N/A;   HYSTERECTOMY ABDOMINAL WITH SALPINGECTOMY     WISDOM TOOTH EXTRACTION      ROS: Review of Systems Negative except as stated above  PHYSICAL EXAM: BP Marland Kitchen)  133/91   Pulse 91   Wt 279 lb 6.4 oz (126.7 kg)   LMP  (LMP Unknown)   SpO2 98%   BMI 43.76 kg/m   Wt Readings from Last 3 Encounters:  08/10/21 279 lb 6.4 oz (126.7 kg)  07/04/21 272 lb 9.6 oz (123.7 kg)  03/29/21 278 lb (126.1 kg)    Physical Exam  General appearance - alert, well appearing, and in no distress Mental status - normal mood, behavior, speech, dress, motor activity, and thought processes Chest - clear to auscultation, no wheezes, rales or rhonchi, symmetric air entry Heart - normal rate, regular rhythm, normal S1, S2, no murmurs, rubs, clicks or gallops      Latest Ref Rng & Units 03/29/2021    3:14 PM 04/28/2016    6:36 PM 02/26/2013    1:54 AM  CMP  Glucose 70 - 99 mg/dL 83  90  628   BUN 6 - 24 mg/dL 13  14  14    Creatinine 0.57 - 1.00 mg/dL  3.15  1.76   Sodium 134 - 144 mmol/L 141  141  143   Potassium 3.5 - 5.2 mmol/L 4.2  3.7  3.9   Chloride 96 - 106 mmol/L 103  107  104   CO2 20 - 29 mmol/L 20  25  22    Calcium 8.7 - 10.2 mg/dL 9.5  9.5  8.9   Total Protein 6.0 - 8.5 g/dL 7.3  8.3  7.5   Total Bilirubin 0.0 - 1.2 mg/dL 1.60  0.5  0.3   Alkaline Phos 44 - 121 IU/L 114  67  113   AST 0 - 40 IU/L 15  21  51   ALT 0 - 32 IU/L 14  18  26     Lipid Panel     Component Value Date/Time   CHOL 224 (H) 03/29/2021 1514   TRIG 173 (H) 03/29/2021 1514   HDL 53 03/29/2021 1514   CHOLHDL 4.2 03/29/2021 1514    LDLCALC 140 (H) 03/29/2021 1514    CBC    Component Value Date/Time   WBC 10.1 03/29/2021 1514   WBC 9.4 04/28/2016 1836   RBC 5.10 03/29/2021 1514   RBC 4.61 04/28/2016 1836   HGB 12.1 03/29/2021 1514   HCT 37.9 03/29/2021 1514   PLT 263 03/29/2021 1514   MCV 74 (L) 03/29/2021 1514   MCH 23.7 (L) 03/29/2021 1514   MCH 27.1 04/28/2016 1836   MCHC 31.9 03/29/2021 1514   MCHC 33.2 04/28/2016 1836   RDW 15.4 03/29/2021 1514   LYMPHSABS 2.1 02/26/2013 0154   MONOABS 0.6 02/26/2013 0154   EOSABS 0.2 02/26/2013 0154   BASOSABS 0.0 02/26/2013 0154    ASSESSMENT AND PLAN:  1. Morbid obesity with body mass index (BMI) of 40.0 to 44.9 in adult [facility]) Encouraged her to continue healthy eating habits and regular exercise as she has been doing.  I think it is reasonable to try and medication to assist with her weight loss efforts.  I would like to put her on Wegovy but advised patient that it would really depend on whether her insurance covers it or not. Went over with her how the medication works and possible side effects including drug-induced pancreatitis.  Went over signs and symptoms that would suggest pancreatitis that would alert her to stop the medication and give me a call.  No history of thyroid cancer.  She did have gallstones but has had a cholecystectomy.  Patient is  willing to give the [facility] a try.  We will have her meet with the clinical pharmacist to be taught how to do the injections. - Semaglutide-Weight Management 0.25 MG/0.5ML SOAJ; Inject 0.25 mg into the skin once a week.  Dispense: 2 mL; Refill: 0  2. Migraine with aura and without status migrainosus, not intractable Advised patient that she can drop off her FMLA form and I will complete it for her.  Since migraines do not occur often, she can continue with abortive therapy with Excedrin Migraine that seems to work well for her.  3. Mixed hyperlipidemia See #1 above   Patient was given the opportunity to ask  questions.  Patient verbalized understanding of the plan and was able to repeat key elements of the plan.   This documentation was completed using Paediatric nurse.  Any transcriptional errors are unintentional.  No orders of the defined types were placed in this encounter.    Requested Prescriptions    No prescriptions requested or ordered in this encounter    No follow-ups on file.  Jonah Blue, MD, FACP

## 2021-08-15 ENCOUNTER — Ambulatory Visit (INDEPENDENT_AMBULATORY_CARE_PROVIDER_SITE_OTHER): Payer: 59 | Admitting: Psychiatry

## 2021-08-15 ENCOUNTER — Other Ambulatory Visit (HOSPITAL_BASED_OUTPATIENT_CLINIC_OR_DEPARTMENT_OTHER): Payer: Self-pay

## 2021-08-15 DIAGNOSIS — F411 Generalized anxiety disorder: Secondary | ICD-10-CM | POA: Diagnosis not present

## 2021-08-15 NOTE — Progress Notes (Signed)
[facility] Counselor/Therapist Progress Note  Patient ID: Wendy Maynard, MRN: [id number],    Date: 08/15/2021  Time Spent: 58 minutes   Treatment Type: Individual Therapy  Reported Symptoms: anxiety, some sadness re: holiday and dad's death  Mental Status Exam:  Appearance:   Neat     Behavior:  Appropriate, Sharing, and Motivated  Motor:  Normal  Speech/Language:   Clear and Coherent  Affect:  Anxious, sad  Mood:  anxious and sad  Thought process:  goal directed  Thought content:    WNL  Sensory/Perceptual disturbances:    WNL  Orientation:  oriented to person, place, time/date, situation, day of week, month of year, year, and stated date of August 15, 2021  Attention:  Good  Concentration:  Good  Memory:  WNL  Fund of knowledge:   Good  Insight:    Good  Judgment:   Good  Impulse Control:  Good   Risk Assessment: Danger to Self:  No Self-injurious Behavior: No Danger to Others: No Duty to Warn:no Physical Aggression / Violence:No  Access to Firearms a concern: No  Gang Involvement:No   Subjective: Patient in today reporting anxiety as her "main symptom", some sadness, but "no depression".  Sadness related mostly to missing dad and still grieving especially on a holiday and discussed this more today.Shares that she does dream about him sometimes and that actually helps. Still some "ups and downs with mom". Saw PCP recently and discussed possibly using a medication to help with weight loss and is checking out costs. Processed some issues she is having with mom and ongoing challenges at home as they live together.   Interventions: Cognitive Behavioral Therapy and Solution-Oriented/Positive Psychology  Treatment Goals: Treatment goals remain on treatment as patient works with strategies to achieve her goals.  Progress is evaluated each session and documented in the "progress" section of note. Long term goal: Stabilize anxiety level while increasing ability to function on  a daily basis. Short term goal: Identify major life conflicts from the past and present that form the basis for present anxiety. Strategies: Reinforced client's insights into the role of her past emotional pain and present anxiety.  Support patient's work in further resolving these issues   Diagnosis:   ICD-10-CM   1. Generalized anxiety disorder  F41.1      Plan: Patient today showing good motivation and participation as she focused on her anxiety related to some occurrences in her past, sadness and grief related to prior loss of her dad, all of which we were able to process today and she felt would help her anxiety.  States "I'm learning how to love myself and take up for myself," and vented more of how she is working through a lot of issues in her past that are bothering her today. "I'm trying to gradually let go of people in my life that are toxic for me. Reports her mood is improving some and patient feeling stronger. "Am teaching people how to treat me". Sleep is better and not taking Ambien every night. Has found some helpfulness in the 478 method discussed previously. Mood overall showing some more improvement today.  Working more successfully to say "No" to people when she needs to do so. Feeling more empowered at times with her progress which she finds encouraging.  Finding increased strength within herself, valuing her progress, less self-doubt, and setting healthier boundaries with others. Encouraged patient in her practice of more positive behaviors including: Getting outside daily and  walking, believing in herself and her ability to make significant changes, continue moving further away from her past and more into the present/future as she works with her goals, reflect on her own progress often, create some downtime for herself each day, continue journaling between sessions, positive self talk, interrupting negative/anxious thoughts and challenging them to replace with more  positive/encouraging and reality based thoughts, interacting more with coworkers in positive ways, intentionally looking for more positives versus negatives daily, searching for the positives within herself and be able to name them, allowing for good sleep patterns, and recognize the strength she shows working with goal directed behaviors to move in a direction that supports her improved emotional health  Goal review and progress/challenges noted with patient.  Next appointment within 2 to 3 weeks.  This record has been created using [employer].  Chart creation errors have been sought, but may not always have been located and corrected.  Such creation errors do not reflect on the standard of medical care provided.   Mathis Fare, LCSW

## 2021-08-16 ENCOUNTER — Other Ambulatory Visit (HOSPITAL_BASED_OUTPATIENT_CLINIC_OR_DEPARTMENT_OTHER): Payer: Self-pay

## 2021-08-17 ENCOUNTER — Other Ambulatory Visit (HOSPITAL_BASED_OUTPATIENT_CLINIC_OR_DEPARTMENT_OTHER): Payer: Self-pay

## 2021-08-17 ENCOUNTER — Ambulatory Visit
Admission: RE | Admit: 2021-08-17 | Discharge: 2021-08-17 | Disposition: A | Discharge status: Home / Self Care | Payer: 59 | Source: Ambulatory Visit | Attending: Urgent Care | Admitting: Urgent Care

## 2021-08-17 ENCOUNTER — Encounter (HOSPITAL_BASED_OUTPATIENT_CLINIC_OR_DEPARTMENT_OTHER): Payer: Self-pay

## 2021-08-17 ENCOUNTER — Ambulatory Visit (INDEPENDENT_AMBULATORY_CARE_PROVIDER_SITE_OTHER): Payer: 59

## 2021-08-17 VITALS — BP 120/92 | HR 93 | Temp 99.1°F | Resp 20

## 2021-08-17 DIAGNOSIS — M79641 Pain in right hand: Secondary | ICD-10-CM

## 2021-08-17 DIAGNOSIS — M79642 Pain in left hand: Secondary | ICD-10-CM

## 2021-08-17 DIAGNOSIS — M25531 Pain in right wrist: Secondary | ICD-10-CM

## 2021-08-17 DIAGNOSIS — M25532 Pain in left wrist: Secondary | ICD-10-CM | POA: Diagnosis not present

## 2021-08-17 MED ORDER — MELOXICAM 15 MG PO TABS: 15.0000 mg | ORAL_TABLET | Freq: Every day | ORAL | 0 refills | Status: DC

## 2021-08-17 MED ORDER — PREDNISONE 20 MG PO TABS: ORAL_TABLET | ORAL | 0 refills | Status: DC

## 2021-08-17 NOTE — ED Provider Notes (Signed)
[facility]   MRN: [id number] DOB: 08/17/1975  Subjective:   Wendy Maynard is a 46 y.o. female presenting for 1 week history of persistent severe pain of the back of her hands and her fingers, back of her wrist.  Patient reports that the pain is debilitating and is affecting her sleep and ability to do her work.  Patient states that she has to do a tremendous amount of typing for work.  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  Otherwise no trauma, swelling, redness, warmth.  No history of gout, rheumatoid arthritis.  No history of musculoskeletal disorders.  She is a type II diabetic treated without insulin.  Last A1c was less than 7%.  Patient would like to get x-rays.  No current facility-administered medications for this encounter.  Current Outpatient Medications:    ALPRAZolam (XANAX) 0.5 MG tablet, Take 1 tablet (0.5 mg total) by mouth 3 (three) times daily as needed for anxiety., Disp: 90 tablet, Rfl: 5   amphetamine-dextroamphetamine (ADDERALL) 15 MG tablet, Take 1 tablet by mouth 2 (two) times daily., Disp: 60 tablet, Rfl: 0   amphetamine-dextroamphetamine (ADDERALL) 15 MG tablet, Take 1 tablet by mouth 2 (two) times daily., Disp: 60 tablet, Rfl: 0   amphetamine-dextroamphetamine (ADDERALL) 15 MG tablet, Take 1 tablet by mouth 2 (two) times daily., Disp: 60 tablet, Rfl: 0   ARIPiprazole (ABILIFY) 5 MG tablet, Take 1/2-1 po qd, Disp: 90 tablet, Rfl: 1   Ascorbic Acid (VITAMIN C) 100 MG tablet, Take 100 mg by mouth daily., Disp: , Rfl:    cyclobenzaprine (FLEXERIL) 10 MG tablet, Take 1 tablet (10 mg total) by mouth daily as needed., Disp: 30 tablet, Rfl: 2   estradiol (ESTRACE) 1 MG tablet, Take 1 tablet (1 mg total) by mouth daily., Disp: 30 tablet, Rfl: 5   fexofenadine (ALLEGRA) 60 MG tablet, Take 60 mg by mouth 2 (two) times daily as needed., Disp: , Rfl:    glycopyrrolate (ROBINUL) 1 MG tablet, Take 1 tablet (1 mg  total) by mouth 2 (two) times daily., Disp: 60 tablet, Rfl: 4   Semaglutide-Weight Management 0.25 MG/0.5ML SOAJ, Inject 0.25 mg into the skin once a week., Disp: 2 mL, Rfl: 0   sertraline (ZOLOFT) 100 MG tablet, Take 2 tablets (200 mg total) by mouth daily., Disp: 180 tablet, Rfl: 0   zolpidem (AMBIEN) 10 MG tablet, Take 1 tablet (10 mg total) by mouth at bedtime as needed for sleep., Disp: 30 tablet, Rfl: 5   Allergies  Allergen Reactions   Ciprofloxacin Hives and Other (See Comments)    Started in arm as soon as drug started.   Clindamycin/Lincomycin Diarrhea and Other (See Comments)    Causes excessive diarrhea   Vicodin [Hydrocodone-Acetaminophen] Itching and Nausea And Vomiting   Penicillins Rash and Other (See Comments)    Occurred at 46 years old Has patient had a PCN reaction causing immediate rash, facial/tongue/throat swelling, SOB or lightheadedness with hypotension: yes Has patient had a PCN reaction causing severe rash involving mucus membranes or skin necrosis: no Has patient had a PCN reaction that required hospitalization no Has patient had a PCN reaction occurring within the last 10 years:no If all of the above answers are "NO", then may proceed with Cephalosporin use.    Sulfa Antibiotics Rash    Past Medical History:  Diagnosis Date   Abdominal pain in female patient 11/2012   Anxiety  Depression    Diabetes mellitus without complication (HCC)    Migraines    Polycystic ovarian disease      Past Surgical History:  Procedure Laterality Date   CHOLECYSTECTOMY N/A 02/26/2013   Procedure: LAPAROSCOPIC CHOLECYSTECTOMY WITH INTRAOPERATIVE CHOLANGIOGRAM;  Surgeon: Adolph Pollack, MD;  Location: [facility];  Service: General;  Laterality: N/A;   HYSTERECTOMY ABDOMINAL WITH SALPINGECTOMY     WISDOM TOOTH EXTRACTION      Family History  Problem Relation Age of Onset   Depression Mother        in response to grief/loss of husband   Hypertension Father         Died sudenly at age 68. Reports father may have had Aspergers   Depression Sister    Anxiety disorder Sister    ADD / ADHD Sister    ADD / ADHD Cousin    Asperger's syndrome Cousin     Social History   Tobacco Use   Smoking status: Former    Types: Cigarettes    Quit date: 01/2021    Years since quitting: 0.5   Smokeless tobacco: Never   Tobacco comments:    plans to start nicotine gum  Vaping Use   Vaping Use: Never used  Substance Use Topics   Alcohol use: Yes    Comment: Occasionally 1-2 x/mth   Drug use: No    Types: Marijuana    Comment: Last use 04/27/16    ROS   Objective:   Vitals: BP (!) 120/92   Pulse 93   Temp 99.1 F (37.3 C)   Resp 20   LMP  (LMP Unknown)   SpO2 97%   Physical Exam Constitutional:      General: She is not in acute distress.    Appearance: Normal appearance. She is well-developed. She is not ill-appearing, toxic-appearing or diaphoretic.  HENT:     Head: Normocephalic and atraumatic.     Nose: Nose normal.     Mouth/Throat:     Mouth: Mucous membranes are moist.  Eyes:     General: No scleral icterus.       Right eye: No discharge.        Left eye: No discharge.     Extraocular Movements: Extraocular movements intact.  Cardiovascular:     Rate and Rhythm: Normal rate.  Pulmonary:     Effort: Pulmonary effort is normal.  Musculoskeletal:     Comments: Diffuse tenderness across the dorsal aspect of her fingers bilaterally extending into the back of her hand and to a lesser degree over the thumb and wrist.  Decreased range of motion for the finger secondary to the pain she feels dorsally.  No swelling, redness, bony deformities.    Skin:    General: Skin is warm and dry.  Neurological:     General: No focal deficit present.     Mental Status: She is alert and oriented to person, place, and time.  Psychiatric:        Mood and Affect: Mood normal.        Behavior: Behavior normal.     DG Hand Complete Right  Result  Date: 08/17/2021 CLINICAL DATA:  Bilateral hand pain for 1 week EXAM: RIGHT HAND - COMPLETE 3+ VIEW; LEFT HAND - COMPLETE 3+ VIEW COMPARISON:  None Available. FINDINGS: Right hand: Frontal, oblique, and lateral views of the right hand are obtained. The bones are normally mineralized. No acute fracture, subluxation, or dislocation. Joint spaces are well preserved.  Soft tissues are unremarkable. Left hand: Frontal, oblique, and lateral views of the left hand are obtained. Bones are normally mineralized. No acute fracture, subluxation, or dislocation. Joint spaces are well preserved. Soft tissues are unremarkable. IMPRESSION: 1. Unremarkable bilateral hands.  No acute process. Electronically Signed   By: Sharlet Salina M.D.   On: 08/17/2021 17:54   DG Hand Complete Left  Result Date: 08/17/2021 CLINICAL DATA:  Bilateral hand pain for 1 week EXAM: RIGHT HAND - COMPLETE 3+ VIEW; LEFT HAND - COMPLETE 3+ VIEW COMPARISON:  None Available. FINDINGS: Right hand: Frontal, oblique, and lateral views of the right hand are obtained. The bones are normally mineralized. No acute fracture, subluxation, or dislocation. Joint spaces are well preserved. Soft tissues are unremarkable. Left hand: Frontal, oblique, and lateral views of the left hand are obtained. Bones are normally mineralized. No acute fracture, subluxation, or dislocation. Joint spaces are well preserved. Soft tissues are unremarkable. IMPRESSION: 1. Unremarkable bilateral hands.  No acute process. Electronically Signed   By: Sharlet Salina M.D.   On: 08/17/2021 17:54     Assessment and Plan :   PDMP not reviewed this encounter.  1. Bilateral hand pain   2. Bilateral wrist pain    Primary suspicion is that this is inflammatory type pain related to the nature of her new work.  Patient requested aggressive management for her pain.  I offered her a prednisone course to be followed by meloxicam.  I did advise that she follow-up with an orthopedist. Counseled  patient on potential for adverse effects with medications prescribed/recommended today, ER and return-to-clinic precautions discussed, patient verbalized understanding.    Wallis Bamberg, [place] 08/17/21 [id number]

## 2021-08-17 NOTE — Discharge Instructions (Addendum)
Make sure you follow up with an orthopedist. In the meantime, take 1/2 tablet of meloxicam tonight. Start prednisone tomorrow. You can use meloxicam again 1 day after you finish your prednisone course as needed.

## 2021-08-17 NOTE — ED Triage Notes (Signed)
Pt here with bilateral hand aching and sharp pain that shoots out of pinkies and hurts all the way to her wrists x 1 week. 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

## 2021-08-20 ENCOUNTER — Other Ambulatory Visit (HOSPITAL_BASED_OUTPATIENT_CLINIC_OR_DEPARTMENT_OTHER): Payer: Self-pay

## 2021-08-27 ENCOUNTER — Other Ambulatory Visit (HOSPITAL_BASED_OUTPATIENT_CLINIC_OR_DEPARTMENT_OTHER): Payer: Self-pay

## 2021-08-27 ENCOUNTER — Other Ambulatory Visit: Payer: Self-pay | Admitting: Psychiatry

## 2021-08-27 DIAGNOSIS — F900 Attention-deficit hyperactivity disorder, predominantly inattentive type: Secondary | ICD-10-CM

## 2021-08-27 MED ORDER — AMPHETAMINE-DEXTROAMPHETAMINE 15 MG PO TABS
15.0000 mg | ORAL_TABLET | Freq: Two times a day (BID) | ORAL | 0 refills | Status: DC
  Filled 2021-08-27 – 2021-08-30 (×2): qty 60, 30d supply, fill #0

## 2021-08-27 NOTE — Telephone Encounter (Signed)
Filled 4/28 appt 7/24

## 2021-08-29 ENCOUNTER — Encounter: Payer: Self-pay | Admitting: Internal Medicine

## 2021-08-29 ENCOUNTER — Ambulatory Visit: Payer: 59 | Admitting: Psychiatry

## 2021-08-29 ENCOUNTER — Other Ambulatory Visit: Payer: Self-pay | Admitting: Internal Medicine

## 2021-08-29 ENCOUNTER — Other Ambulatory Visit (HOSPITAL_BASED_OUTPATIENT_CLINIC_OR_DEPARTMENT_OTHER): Payer: Self-pay

## 2021-08-29 ENCOUNTER — Telehealth: Payer: Self-pay | Admitting: Internal Medicine

## 2021-08-29 MED ORDER — WEGOVY 0.5 MG/0.5ML ~~LOC~~ SOAJ
0.5000 mg | SUBCUTANEOUS | 1 refills | Status: DC
  Filled 2021-08-29: qty 2, fill #0
  Filled 2021-09-13: qty 2, 28d supply, fill #0
  Filled 2021-10-23: qty 2, 28d supply, fill #1

## 2021-08-29 MED ORDER — SEMAGLUTIDE-WEIGHT MANAGEMENT 0.25 MG/0.5ML ~~LOC~~ SOAJ
0.5000 mg | SUBCUTANEOUS | 1 refills | Status: DC
  Filled 2021-08-29: qty 4, fill #0

## 2021-08-29 NOTE — Telephone Encounter (Signed)
Layna called from the pharmacy requesting medication clarification for the [facility], please call back. An order for 0.25 ml was called in while the instructions state injecting 0.5 ml which would need a different order. Please advise

## 2021-08-29 NOTE — Telephone Encounter (Signed)
Corrected rx sent

## 2021-08-30 ENCOUNTER — Other Ambulatory Visit (HOSPITAL_BASED_OUTPATIENT_CLINIC_OR_DEPARTMENT_OTHER): Payer: Self-pay

## 2021-09-03 ENCOUNTER — Other Ambulatory Visit (HOSPITAL_BASED_OUTPATIENT_CLINIC_OR_DEPARTMENT_OTHER): Payer: Self-pay

## 2021-09-03 ENCOUNTER — Encounter: Payer: Self-pay | Admitting: Psychiatry

## 2021-09-03 ENCOUNTER — Telehealth (INDEPENDENT_AMBULATORY_CARE_PROVIDER_SITE_OTHER): Payer: 59 | Admitting: Psychiatry

## 2021-09-03 DIAGNOSIS — F900 Attention-deficit hyperactivity disorder, predominantly inattentive type: Secondary | ICD-10-CM | POA: Diagnosis not present

## 2021-09-03 DIAGNOSIS — F3342 Major depressive disorder, recurrent, in full remission: Secondary | ICD-10-CM | POA: Diagnosis not present

## 2021-09-03 DIAGNOSIS — F4310 Post-traumatic stress disorder, unspecified: Secondary | ICD-10-CM | POA: Diagnosis not present

## 2021-09-03 MED ORDER — SERTRALINE HCL 100 MG PO TABS
200.0000 mg | ORAL_TABLET | Freq: Every day | ORAL | 1 refills | Status: DC
  Filled 2021-09-03: qty 180, 90d supply, fill #0

## 2021-09-03 MED ORDER — AMPHETAMINE-DEXTROAMPHETAMINE 15 MG PO TABS
15.0000 mg | ORAL_TABLET | Freq: Two times a day (BID) | ORAL | 0 refills | Status: DC
  Filled 2021-10-30: qty 60, 30d supply, fill #0

## 2021-09-03 MED ORDER — AMPHETAMINE-DEXTROAMPHETAMINE 15 MG PO TABS
15.0000 mg | ORAL_TABLET | Freq: Two times a day (BID) | ORAL | 0 refills | Status: DC
  Filled 2021-10-01: qty 60, 30d supply, fill #0

## 2021-09-03 MED ORDER — AMPHETAMINE-DEXTROAMPHETAMINE 15 MG PO TABS
15.0000 mg | ORAL_TABLET | Freq: Two times a day (BID) | ORAL | 0 refills | Status: DC
  Filled 2021-11-27: qty 20, 10d supply, fill #0
  Filled 2021-11-27: qty 40, 20d supply, fill #0

## 2021-09-03 NOTE — Progress Notes (Signed)
Wendy Maynard [id number] 06/26/1975 46 y.o.  Virtual Visit via Video Note  I connected with pt @ on 09/03/21 at 12:30 PM EDT by a video enabled telemedicine application and verified that I am speaking with the correct person using two identifiers.   I discussed the limitations of evaluation and management by telemedicine and the availability of in person appointments. The patient expressed understanding and agreed to proceed.  I discussed the assessment and treatment plan with the patient. The patient was provided an opportunity to ask questions and all were answered. The patient agreed with the plan and demonstrated an understanding of the instructions.   The patient was advised to call back or seek an in-person evaluation if the symptoms worsen or if the condition fails to improve as anticipated.  I provided 35 minutes of non-face-to-face time during this encounter.  The patient was located at home.  The provider was located at home.   Thayer Headings, PMHNP   Subjective:   Patient ID:  Wendy Maynard is a 46 y.o. (DOB Jul 28, 1975) female.  Chief Complaint: No chief complaint on file.   HPI [name] presents for follow-up of anxiety, depression, insomnia, and ADHD. She reports that her anxiety has been "a little bit better." She has been using deep breathing and gentle stretching and this has helped her anxiety. She reports that she continues to have some periods of anxiety. She continues to have some periods of hypervigilance. Some intrusive memories after talking about certain events in therapy. Some intrusive thoughts when she has downtime.  Some nightmares and bizarre dreams, some of which are related to past events. Some periods of increased worry and occ catastrophic thoughts. She reports that she has had a couple of panic attacks. Anxiety is triggered by loud noises at times. Occ has increased HR and mild GI s/s with anxiety. She noticed some mild irritability last week.   Denies  depressed mood. Her motivation has been good. Has applied to a job and planned a trip to the beach. Energy has been ok. Avoids watching or listening to anything stimulating before bedtime. Able to fall asleep most nights without difficulty. Had some early morning awakenings a couple of weeks ago and this seems to have resolved. Concentration has been ok. Denies SI or HI.   She decreased Abilify to 2 mg about a month ago. She denies any change in mood or anxiety with decrease in Abilify.   She has recently started Avala and has lost 15 lbs in a few weeks. She has had less appetite. She has been working out some on her work breaks.   Her mother will be traveling soon.   She reports taking Alprazolam prn most days.   Past Psychiatric Medication Trials: Abilify- Helped mood. Wt gain.  Sertraline-Helpful  Lexapro- Felt tired and irritable Prozac Cymbalta- adverse effects Wellbutrin XL- Insomnia Trazodone-ineffective Ambien Xanax Lorazepam Adderall  Adderall XR- Insomnia, irritability, dry mouth  Review of Systems:  Review of Systems  Cardiovascular:  Negative for palpitations.  Endocrine:       Reports that she is pre-diabetic  Musculoskeletal:  Negative for gait problem.  Neurological:  Negative for tremors.  Psychiatric/Behavioral:         Please refer to HPI    Medications: I have reviewed the patient's current medications.  Current Outpatient Medications  Medication Sig Dispense Refill   Ascorbic Acid (VITAMIN C) 100 MG tablet Take 100 mg by mouth daily.     cyclobenzaprine (FLEXERIL) 10 MG tablet  Take 1 tablet (10 mg total) by mouth daily as needed. 30 tablet 2   estradiol (ESTRACE) 1 MG tablet Take 1 tablet (1 mg total) by mouth daily. 30 tablet 5   fexofenadine (ALLEGRA) 60 MG tablet Take 60 mg by mouth 2 (two) times daily as needed.     glycopyrrolate (ROBINUL) 1 MG tablet Take 1 tablet (1 mg total) by mouth 2 (two) times daily. 60 tablet 4   meloxicam (MOBIC) 15 MG  tablet Take 1 tablet (15 mg total) by mouth daily. 30 tablet 0   Multiple Vitamin (MULTIVITAMIN) tablet Take 1 tablet by mouth daily.     Semaglutide-Weight Management (WEGOVY) 0.5 MG/0.5ML SOAJ Inject 0.5 mg into the skin once a week. 2 mL 1   zolpidem (AMBIEN) 10 MG tablet Take 1 tablet (10 mg total) by mouth at bedtime as needed for sleep. 30 tablet 5   ALPRAZolam (XANAX) 0.5 MG tablet Take 1 tablet (0.5 mg total) by mouth 3 (three) times daily as needed for anxiety. 90 tablet 5   [START ON 11/22/2021] amphetamine-dextroamphetamine (ADDERALL) 15 MG tablet Take 1 tablet by mouth 2 (two) times daily. 60 tablet 0   [START ON 10/25/2021] amphetamine-dextroamphetamine (ADDERALL) 15 MG tablet Take 1 tablet by mouth 2 (two) times daily. 60 tablet 0   [START ON 09/27/2021] amphetamine-dextroamphetamine (ADDERALL) 15 MG tablet Take 1 tablet by mouth 2 (two) times daily. 60 tablet 0   predniSONE (DELTASONE) 20 MG tablet Take 2 tablets daily with breakfast. (Patient not taking: Reported on 09/03/2021) 10 tablet 0   sertraline (ZOLOFT) 100 MG tablet Take 2 tablets (200 mg total) by mouth daily. 180 tablet 1   No current facility-administered medications for this visit.    Medication Side Effects: None  Allergies:  Allergies  Allergen Reactions   Ciprofloxacin Hives and Other (See Comments)    Started in arm as soon as drug started.   Clindamycin/Lincomycin Diarrhea and Other (See Comments)    Causes excessive diarrhea   Vicodin [Hydrocodone-Acetaminophen] Itching and Nausea And Vomiting   Penicillins Rash and Other (See Comments)    Occurred at 46 years old Has patient had a PCN reaction causing immediate rash, facial/tongue/throat swelling, SOB or lightheadedness with hypotension: yes Has patient had a PCN reaction causing severe rash involving mucus membranes or skin necrosis: no Has patient had a PCN reaction that required hospitalization no Has patient had a PCN reaction occurring within the  last 10 years:no If all of the above answers are "NO", then may proceed with Cephalosporin use.    Sulfa Antibiotics Rash    Past Medical History:  Diagnosis Date   Abdominal pain in female patient 11/2012   Anxiety    Depression    Diabetes mellitus without complication (HCC)    Migraines    Polycystic ovarian disease     Family History  Problem Relation Age of Onset   Depression Mother        in response to grief/loss of husband   Hypertension Father        Died sudenly at age 55. Reports father may have had Aspergers   Depression Sister    Anxiety disorder Sister    ADD / ADHD Sister    ADD / ADHD Cousin    Asperger's syndrome Cousin     Social History   Socioeconomic History   Marital status: Single    Spouse name: Not on file   Number of children: 0   Years of education:  Not on file   Highest education level: Master's degree (e.g., MA, MS, MEng, MEd, MSW, MBA)  Occupational History   Occupation: Physicist, medical at [employer]  Tobacco Use   Smoking status: Former    Types: Cigarettes    Quit date: 01/2021    Years since quitting: 0.6   Smokeless tobacco: Never   Tobacco comments:    plans to start nicotine gum  Vaping Use   Vaping Use: Never used  Substance and Sexual Activity   Alcohol use: Yes    Comment: Occasionally 1-2 x/mth   Drug use: No    Types: Marijuana    Comment: Last use 04/27/16   Sexual activity: Not Currently    Birth control/protection: Condom  Other Topics Concern   Not on file  Social History Narrative   Not on file   Social Determinants of Health   Financial Resource Strain: Not on file  Food Insecurity: Not on file  Transportation Needs: Not on file  Physical Activity: Not on file  Stress: Not on file  Social Connections: Not on file  Intimate Partner Violence: Not on file    Past Medical History, Surgical history, Social history, and Family history were reviewed and updated as appropriate.   Please see review of systems for  further details on the patient's review from today.   Objective:   Physical Exam:  LMP  (LMP Unknown)   Physical Exam Neurological:     Mental Status: She is alert and oriented to person, place, and time.     Cranial Nerves: No dysarthria.  Psychiatric:        Attention and Perception: Attention and perception normal.        Mood and Affect: Mood is not depressed.        Speech: Speech normal.        Behavior: Behavior is cooperative.        Thought Content: Thought content normal. Thought content is not paranoid or delusional. Thought content does not include homicidal or suicidal ideation. Thought content does not include homicidal or suicidal plan.        Cognition and Memory: Cognition and memory normal.        Judgment: Judgment normal.     Comments: Insight intact Mood is less anxious compared to last exam     Lab Review:     Component Value Date/Time   NA 141 03/29/2021 1514   K 4.2 03/29/2021 1514   CL 103 03/29/2021 1514   CO2 20 03/29/2021 1514   GLUCOSE 83 03/29/2021 1514   GLUCOSE 90 04/28/2016 1836   BUN 13 03/29/2021 1514   CREATININE 0.69 03/29/2021 1514   CALCIUM 9.5 03/29/2021 1514   PROT 7.3 03/29/2021 1514   ALBUMIN 4.9 (H) 03/29/2021 1514   AST 15 03/29/2021 1514   ALT 14 03/29/2021 1514   ALKPHOS 114 03/29/2021 1514   BILITOT <0.2 03/29/2021 1514   GFRNONAA >60 04/28/2016 1836   GFRAA >60 04/28/2016 1836       Component Value Date/Time   WBC 10.1 03/29/2021 1514   WBC 9.4 04/28/2016 1836   RBC 5.10 03/29/2021 1514   RBC 4.61 04/28/2016 1836   HGB 12.1 03/29/2021 1514   HCT 37.9 03/29/2021 1514   PLT 263 03/29/2021 1514   MCV 74 (L) 03/29/2021 1514   MCH 23.7 (L) 03/29/2021 1514   MCH 27.1 04/28/2016 1836   MCHC 31.9 03/29/2021 1514   MCHC 33.2 04/28/2016 1836   RDW 15.4  03/29/2021 1514   LYMPHSABS 2.1 02/26/2013 0154   MONOABS 0.6 02/26/2013 0154   EOSABS 0.2 02/26/2013 0154   BASOSABS 0.0 02/26/2013 0154    No results found for:  "POCLITH", "LITHIUM"   No results found for: "PHENYTOIN", "PHENOBARB", "VALPROATE", "CBMZ"   .res Assessment: Plan:    Pt seen for 30 minutes and time spent discussing response to increase in Sertraline and her decreasing Abilify about a month ago. She reports that she would like to stop Abilify if possible.  Agree with discontinuing Abilify at this time.  Encouraged patient to contact office if she experiences worsening depressive symptoms with discontinuation of Abilify.  Discussed that if she has increased depression without the Abilify, that Rexulti and Vraylar may be possible alternatives to Abilify with less risk of weight gain and metabolic side effects. Revisited discussion about option of starting BuSpar for anxiety since she continues to have some residual anxiety.  She reports that she prefers not to start any additional medication at this time and will consider BuSpar as a possible option if she experiences worsening anxiety in the future.  She also attributes some increased anxiety to discussing difficult issues and events in therapy and anticipates this improving in the future as she progresses further in therapy. Will continue Adderall 15 mg twice daily for attention deficit disorder. Continue alprazolam 0.5 mg 3 times daily as needed for anxiety. Continue sertraline 200 mg daily for anxiety and depression. Continue Ambien 10 mg at bedtime for insomnia. Recommend continuing therapy with Rockne Menghini, LCSW.  Pt to follow-up in 3 months or sooner if clinically indicated.  Patient advised to contact office with any questions, adverse effects, or acute worsening in signs and symptoms.   Diagnoses and all orders for this visit:  Attention deficit hyperactivity disorder (ADHD), predominantly inattentive type -     amphetamine-dextroamphetamine (ADDERALL) 15 MG tablet; Take 1 tablet by mouth 2 (two) times daily. -     amphetamine-dextroamphetamine (ADDERALL) 15 MG tablet; Take 1 tablet  by mouth 2 (two) times daily. -     amphetamine-dextroamphetamine (ADDERALL) 15 MG tablet; Take 1 tablet by mouth 2 (two) times daily.  Recurrent major depressive disorder, in full remission (HCC) -     sertraline (ZOLOFT) 100 MG tablet; Take 2 tablets (200 mg total) by mouth daily.  Posttraumatic stress disorder -     sertraline (ZOLOFT) 100 MG tablet; Take 2 tablets (200 mg total) by mouth daily.     Please see After Visit Summary for patient specific instructions.  Future Appointments  Date Time Provider Department Center  09/10/2021  5:00 PM Mathis Fare, LCSW [facility] None  09/17/2021  5:00 PM Mathis Fare, LCSW [facility] None  09/24/2021  5:00 PM Mathis Fare, LCSW [facility] None  10/05/2021  2:50 PM Marcine Matar, MD [facility] None  10/08/2021  5:00 PM Mathis Fare, LCSW [facility] None  10/22/2021  5:00 PM Mathis Fare, LCSW [facility] None  11/27/2021  2:10 PM Marcine Matar, MD [facility] None    No orders of the defined types were placed in this encounter.     -------------------------------

## 2021-09-10 ENCOUNTER — Ambulatory Visit (INDEPENDENT_AMBULATORY_CARE_PROVIDER_SITE_OTHER): Payer: 59 | Admitting: Psychiatry

## 2021-09-10 DIAGNOSIS — F411 Generalized anxiety disorder: Secondary | ICD-10-CM

## 2021-09-10 NOTE — Progress Notes (Signed)
[facility] Counselor/Therapist Progress Note  Patient ID: Wendy Maynard, MRN: [id number],    Date: 09/10/2021  Time Spent: 55 minutes   Treatment Type: Individual Therapy  Reported Symptoms: anxiety, some lowered energy  Mental Status Exam:  Appearance:   Casual     Behavior:  Appropriate, Sharing, and Motivated  Motor:  Normal  Speech/Language:   Clear and Coherent  Affect:  anxiety  Mood:  anxious  Thought process:  normal  Thought content:    WNL  Sensory/Perceptual disturbances:    WNL  Orientation:  oriented to person, place, time/date, situation, day of week, month of year, year, and stated date of September 10, 2021  Attention:  Good  Concentration:  Good  Memory:  WNL  Fund of knowledge:   Good  Insight:    Good  Judgment:   Good  Impulse Control:  Good   Risk Assessment: Danger to Self:  No Self-injurious Behavior: No Danger to Others: No Duty to Warn:no Physical Aggression / Violence:No  Access to Firearms a concern: No  Gang Involvement:No   Subjective:  Patient in today reporting anxiety as primary symptom, and some unresolved "sadness regarding loss of dad", both of which we worked on in session today. Working further on boundaries, both in saying "No" when she needs to and "not answering my phone after 8pm at night " and this is feeling better to patient. Finding it hard to relax and get to sleep, "a makes me more tired next day and affects my mood". Shared more of her unresolved grief over loss of dad ". Discussed some issues with her neighbors at her apt complex and some unsanitary practices they are involved in that impact patient. Wanting to process today ways of communicating with neighbors that might be effective. Also processing her feelings about herself and potential weight loss and is feeling more optimistic after having lost 15 lbs and trying to do it following directions.   Interventions: Cognitive Behavioral Therapy  Treatment Goals: Treatment  goals remain on treatment as patient works with strategies to achieve her goals.  Progress is evaluated each session and documented in the "progress" section of note. Long term goal: Stabilize anxiety level while increasing ability to function on a daily basis. Short term goal: Identify major life conflicts from the past and present that form the basis for present anxiety. Strategies: Reinforced client's insights into the role of her past emotional pain and present anxiety.  Support patient's work in further resolving these issues   Diagnosis:   ICD-10-CM   1. Generalized anxiety disorder  F41.1      Plan:  Patient in today showing good participation and motivation focusing more on her anxiety related to current health and using new medication to help her with weight loss, her unresolved grief of her dad, and practicing more assertiveness and good communication skills in setting limits and problem solving with others such as he neighbors most recently, all of which impact her anxiety level. As noted above, she's lost 15 lbs and starting to feel encouragement for herself.  Continued work on "loving myself more and having healthier boundaries."As noted above continues work on her unresolved grief regarding loss of dad and noticing more progress.  Really tired today and feels it is more related to her lack of sleep more recently and plans to use her Ambien tonight to help get some sleep, "as I had tried to be off of it a while but I also need  sleep".  If this does not help she plans to follow up with her med provider about sleep issues.  Has become much more aware that "I allow people to treat me the way they treat me" referring to times when people do not treat her well and states that she continues to learn more about this and ways of communicating how people can treat her and what she will not allow.  Definitely values her progress and expresses less self-doubt. Encouraged patient in her practice of  more positive behaviors including: Reflecting on her progress often, getting outside daily and walking, believing in herself and her ability to make positive changes, continue moving further away from her past and more into the present/future as she works with her goals, create some downtime for herself each day, continue journaling between sessions, positive self talk, interrupting negative/anxious thoughts and challenging them to replace with more positive/encouraging and reality based thoughts, interacting more with coworkers in positive ways, intentionally looking for more positives versus negatives daily, searching for the positives within herself, allowing for good sleep patterns, and realize the strength she shows working with goal directed behaviors to move in a direction that supports her improved emotional health and overall wellbeing.  Goal review and progress/challenges noted with patient.  Next appt within 2-3 weeks.  This record has been created using [employer].  Chart creation errors have been sought, but may not always have been located and corrected.  Such creation errors do not reflect on the standard of medical care provided.   Mathis Fare, LCSW

## 2021-09-13 ENCOUNTER — Encounter (HOSPITAL_BASED_OUTPATIENT_CLINIC_OR_DEPARTMENT_OTHER): Payer: Self-pay

## 2021-09-13 ENCOUNTER — Other Ambulatory Visit (HOSPITAL_BASED_OUTPATIENT_CLINIC_OR_DEPARTMENT_OTHER): Payer: Self-pay

## 2021-09-17 ENCOUNTER — Ambulatory Visit: Payer: 59 | Admitting: Psychiatry

## 2021-09-19 ENCOUNTER — Other Ambulatory Visit (HOSPITAL_BASED_OUTPATIENT_CLINIC_OR_DEPARTMENT_OTHER): Payer: Self-pay

## 2021-09-24 ENCOUNTER — Ambulatory Visit: Payer: 59 | Admitting: Psychiatry

## 2021-09-25 ENCOUNTER — Telehealth: Payer: Self-pay | Admitting: Psychiatry

## 2021-09-25 NOTE — Telephone Encounter (Signed)
Patient asked that I let you know. See message. I reviewed medications we prescribe and there are no new medications. She said she recently started [facility] and she didn't know if that was marking her sick or she had a bug. She is also taking Flexeril and Mobic. She said she is staying hydrated.

## 2021-09-25 NOTE — Telephone Encounter (Signed)
Next visit is 12/05/21. Wendy Maynard states she thinks she is having an adverse reaction to a medication she is taking from here or another one of her doctors. It's making her sick to her stomach and having trouble keeping the pills down. Please call her at [phone].

## 2021-09-26 ENCOUNTER — Other Ambulatory Visit (HOSPITAL_BASED_OUTPATIENT_CLINIC_OR_DEPARTMENT_OTHER): Payer: Self-pay

## 2021-09-26 NOTE — Telephone Encounter (Signed)
Patient notified

## 2021-10-01 ENCOUNTER — Other Ambulatory Visit (HOSPITAL_BASED_OUTPATIENT_CLINIC_OR_DEPARTMENT_OTHER): Payer: Self-pay

## 2021-10-02 ENCOUNTER — Ambulatory Visit (INDEPENDENT_AMBULATORY_CARE_PROVIDER_SITE_OTHER): Payer: 59 | Admitting: Psychiatry

## 2021-10-02 DIAGNOSIS — F411 Generalized anxiety disorder: Secondary | ICD-10-CM | POA: Diagnosis not present

## 2021-10-02 NOTE — Progress Notes (Signed)
[facility] Counselor/Therapist Progress Note  Patient ID: Wendy Maynard, MRN: [id number],    Date: 10/02/2021  Time Spent: 55 minutes   Treatment Type: Individual Therapy  Reported Symptoms: anxiety, depression (stronger while she has been physically ill)  Mental Status Exam:  Appearance:   Neat     Behavior:  Appropriate, Motivated  Motor:  Normal  Speech/Language:   Clear and Coherent  Affect:  anxious  Mood:  anxious  Thought process:  goal directed  Thought content:    WNL  Sensory/Perceptual disturbances:    WNL  Orientation:  oriented to person, place, time/date, situation, day of week, month of year, year, and stated date of October 02, 2021  Attention:  Good  Concentration:  Good  Memory:  WNL  Fund of knowledge:   Good  Insight:    Good  Judgment:   Good  Impulse Control:  Good   Risk Assessment: Danger to Self:  No Self-injurious Behavior: No Danger to Others: No Duty to Warn:no Physical Aggression / Violence:No  Access to Firearms a concern: No  Gang Involvement:No   Subjective: Patient in today reporting anxiety and some depression that worsened during a recent illness, but depression is decreasing now.  Struggling recently with staying alone while being so physically ill as mom was out of town, "fearful being so sick and being alone". Still some unresolved grief re: dad's death, but "not as intrusive". Strongly relates to some quotes she has remembered about grief and shared several today. Further work on setting healthy limits with others and in relationship with mother. Concerns discussed today in reference to a couple of neighbors and problems she's been experiencing on a regular basis. Discussed today more strategies that might could help the lack of respect for patient's personal property issues she is experiencing with neighbors.  Some worrying about mom's health issues and uncertainty. Also needed part of session to discuss update on relationship with  she and her sister who will talk with patient but not with their mother. Continuing to lose weight as monitored by her doctor and has lost 20 lbs.   Interventions: Cognitive Behavioral Therapy and Ego-Supportive  Treatment Goals: Treatment goals remain on treatment as patient works with strategies to achieve her goals.  Progress is evaluated each session and documented in the "progress" section of note. Long term goal: Stabilize anxiety level while increasing ability to function on a daily basis. Short term goal: Identify major life conflicts from the past and present that form the basis for present anxiety. Strategies: Reinforced client's insights into the role of her past emotional pain and present anxiety.  Support patient's work in further resolving these issues.  Diagnosis:   ICD-10-CM   1. Generalized anxiety disorder  F41.1      Plan: Patient today actively participating in session and motivated for change as she continues to work with goal-directed behaviors to help alleviate her anxiety and depression. Depression continues to improve. Current anxiety more related to relationship with neighbors, health concerns, and worries about a possible return to severe depression which she's had previously experienced. Worrying some more about finances and the economy.States she's been using journaling as a tool to process thoughts between sessions which I encourage her to continue.  Reports still working on loving herself more and "making peace with who I am and liking who I am."  Encouraged patient to be practicing more of the positive behaviors discussed including believing in herself and her ability to make  positive changes, reflect more on her progress, getting outside daily and walking, continue moving further away from her past and more into the present/future as she works with her goals, create some downtime for herself each day, continue journaling between sessions, positive self talk,  interrupting negative/anxious thoughts and challenging them to replace with more positive/encouraging and reality based thoughts, interacting more with coworkers in positive ways, intentionally looking for more positives versus negatives daily, searching for the positives within herself, allowing for good sleep patterns, and realize the strengths she shows working with goal directed behaviors to move in a direction that supports her improved emotional health.  Goal review and progress/challenges noted with patient.  Next appointment within 2 weeks.   Mathis Fare, LCSW

## 2021-10-05 ENCOUNTER — Other Ambulatory Visit (HOSPITAL_BASED_OUTPATIENT_CLINIC_OR_DEPARTMENT_OTHER): Payer: Self-pay

## 2021-10-05 ENCOUNTER — Encounter: Payer: Self-pay | Admitting: Internal Medicine

## 2021-10-05 ENCOUNTER — Other Ambulatory Visit: Payer: Self-pay | Admitting: Pharmacist

## 2021-10-05 ENCOUNTER — Ambulatory Visit: Payer: 59 | Attending: Internal Medicine | Admitting: Internal Medicine

## 2021-10-05 VITALS — BP 118/81 | HR 95 | Resp 16 | Ht 67.0 in | Wt 255.8 lb

## 2021-10-05 DIAGNOSIS — Z8371 Family history of colonic polyps: Secondary | ICD-10-CM

## 2021-10-05 DIAGNOSIS — Z0000 Encounter for general adult medical examination without abnormal findings: Secondary | ICD-10-CM

## 2021-10-05 DIAGNOSIS — Z79890 Hormone replacement therapy (postmenopausal): Secondary | ICD-10-CM

## 2021-10-05 DIAGNOSIS — Z5320 Procedure and treatment not carried out because of patient's decision for unspecified reasons: Secondary | ICD-10-CM

## 2021-10-05 DIAGNOSIS — Z6841 Body Mass Index (BMI) 40.0 and over, adult: Secondary | ICD-10-CM

## 2021-10-05 MED ORDER — ESTRADIOL 1 MG PO TABS
1.0000 mg | ORAL_TABLET | Freq: Every day | ORAL | 0 refills | Status: DC
  Filled 2021-10-05 – 2021-10-30 (×2): qty 30, 30d supply, fill #0

## 2021-10-05 NOTE — Progress Notes (Signed)
Patient ID: Wendy Maynard, female    DOB: 21-Feb-1975  MRN: [id number]  CC: Annual Exam   Subjective: Wendy Maynard is a 46 y.o. female who presents for annual exam Her concerns today include:  Patient with history of OSA on CPAP, obesity, prediabetes, HL,MDD, ADHD, PTSD (plugged in with behavioral health services), migraines with aura, HRT on estradiol and glycopyrrolate  Obesity: Patient was started on Wegovy on last visit 2 months ago.  The first month she had no major problems except for mild heartburn.  Then she was out of it for 2 weeks due to shortage at the pharmacy.  Last week she had an acute illness with nausea and diarrhea.  Had some episodes where she felt the blood sugar was low and felt lightheaded.  Reports that she was forgetting to eat because of decreased appetite from the medication.  Eating foods that are high in fat or over eating would make her sick to her stomach. -Now she makes a point to eat her meals throughout the day.  She has been moving more.  She has 2 to 10-minute breaks at work.  Has a gym in her building so on her brakes she walks on the treadmill.  She also walks her dog at nights. -Started the [facility] 0.5 mg 4 days ago.  Tolerating it well without any issues.   Patient Active Problem List   Diagnosis Date Noted   Migraine with aura and without status migrainosus, not intractable 03/29/2021   Chronic pain of right knee 03/29/2021   Morbid obesity with body mass index (BMI) of 40.0 to 44.9 in adult (HCC) 03/29/2021   OSA on CPAP 03/29/2021   Postmenopausal hormone replacement therapy 03/29/2021   Neck muscle spasm 03/29/2021   Elevated blood pressure reading without diagnosis of hypertension 03/29/2021   Snoring 12/07/2020   Excessive sleepiness 12/07/2020   Fatigue 12/07/2020   Insomnia 12/07/2020   Sleep paralysis 12/07/2020   ADHD, predominantly inattentive type 02/06/2018   Posttraumatic stress disorder 02/06/2018   MDD (major depressive disorder),  recurrent severe, without psychosis (HCC) 04/28/2016   Chronic calculous cholecystitis 03/23/2013   Cholecystitis 02/26/2013     Current Outpatient Medications on File Prior to Visit  Medication Sig Dispense Refill   ALPRAZolam (XANAX) 0.5 MG tablet Take 1 tablet (0.5 mg total) by mouth 3 (three) times daily as needed for anxiety. 90 tablet 5   [START ON 11/22/2021] amphetamine-dextroamphetamine (ADDERALL) 15 MG tablet Take 1 tablet by mouth 2 (two) times daily. 60 tablet 0   [START ON 10/25/2021] amphetamine-dextroamphetamine (ADDERALL) 15 MG tablet Take 1 tablet by mouth 2 (two) times daily. 60 tablet 0   amphetamine-dextroamphetamine (ADDERALL) 15 MG tablet Take 1 tablet by mouth 2 (two) times daily. 60 tablet 0   Ascorbic Acid (VITAMIN C) 100 MG tablet Take 100 mg by mouth daily.     cyclobenzaprine (FLEXERIL) 10 MG tablet Take 1 tablet (10 mg total) by mouth daily as needed. 30 tablet 2   fexofenadine (ALLEGRA) 60 MG tablet Take 60 mg by mouth 2 (two) times daily as needed.     glycopyrrolate (ROBINUL) 1 MG tablet Take 1 tablet (1 mg total) by mouth 2 (two) times daily. 60 tablet 4   meloxicam (MOBIC) 15 MG tablet Take 1 tablet (15 mg total) by mouth daily. (Patient not taking: Reported on 10/05/2021) 30 tablet 0   Multiple Vitamin (MULTIVITAMIN) tablet Take 1 tablet by mouth daily.     Semaglutide-Weight Management (WEGOVY) 0.5  MG/0.5ML SOAJ Inject 0.5 mg into the skin once a week. 2 mL 1   sertraline (ZOLOFT) 100 MG tablet Take 2 tablets (200 mg total) by mouth daily. 180 tablet 1   zolpidem (AMBIEN) 10 MG tablet Take 1 tablet (10 mg total) by mouth at bedtime as needed for sleep. 30 tablet 5   No current facility-administered medications on file prior to visit.    Allergies  Allergen Reactions   Ciprofloxacin Hives and Other (See Comments)    Started in arm as soon as drug started.   Clindamycin/Lincomycin Diarrhea and Other (See Comments)    Causes excessive diarrhea   Vicodin  [Hydrocodone-Acetaminophen] Itching and Nausea And Vomiting   Penicillins Rash and Other (See Comments)    Occurred at 46 years old Has patient had a PCN reaction causing immediate rash, facial/tongue/throat swelling, SOB or lightheadedness with hypotension: yes Has patient had a PCN reaction causing severe rash involving mucus membranes or skin necrosis: no Has patient had a PCN reaction that required hospitalization no Has patient had a PCN reaction occurring within the last 10 years:no If all of the above answers are "NO", then may proceed with Cephalosporin use.    Sulfa Antibiotics Rash    Social History   Socioeconomic History   Marital status: Single    Spouse name: Not on file   Number of children: 0   Years of education: Not on file   Highest education level: Master's degree (e.g., MA, MS, MEng, MEd, MSW, MBA)  Occupational History   Occupation: Retail buyer at [employer]  Tobacco Use   Smoking status: Former    Types: Cigarettes    Quit date: 01/2021    Years since quitting: 0.7   Smokeless tobacco: Never   Tobacco comments:    plans to start nicotine gum  Vaping Use   Vaping Use: Never used  Substance and Sexual Activity   Alcohol use: Yes    Comment: Occasionally 1-2 x/mth   Drug use: No    Types: Marijuana    Comment: Last use 04/27/16   Sexual activity: Not Currently    Birth control/protection: Condom  Other Topics Concern   Not on file  Social History Narrative   Not on file   Social Determinants of Health   Financial Resource Strain: Not on file  Food Insecurity: Not on file  Transportation Needs: Not on file  Physical Activity: Not on file  Stress: Not on file  Social Connections: Not on file  Intimate Partner Violence: Not on file    Family History  Problem Relation Age of Onset   Depression Mother        in response to grief/loss of husband   Hypertension Father        Died sudenly at age 61. Reports father may have had Aspergers    Depression Sister    Anxiety disorder Sister    ADD / ADHD Sister    ADD / ADHD Cousin    Asperger's syndrome Cousin     Past Surgical History:  Procedure Laterality Date   CHOLECYSTECTOMY N/A 02/26/2013   Procedure: LAPAROSCOPIC CHOLECYSTECTOMY WITH INTRAOPERATIVE CHOLANGIOGRAM;  Surgeon: Adolph Pollack, MD;  Location: [facility];  Service: General;  Laterality: N/A;   HYSTERECTOMY ABDOMINAL WITH SALPINGECTOMY     WISDOM TOOTH EXTRACTION      ROS: Review of Systems  Constitutional:        Wonders about having a mammogram.  Last one she had done a few  years ago led to a biopsy being done on the left breast that was negative.  She has been nervous ever since then about having another mammogram.  Checks her breast regularly.  Does not feel any abnormal masses  HENT:  Negative for congestion and hearing loss.   Eyes:  Negative for visual disturbance.       She does not recall when she had a last eye exam.  Respiratory:  Negative for cough and shortness of breath.   Gastrointestinal:  Negative for abdominal pain.       Moving her bowels okay.  Wonders about whether she needs to have a colonoscopy early because of family history of colon cancer and polyps.  Paternal aunt died of colon cancer in her 93s.  Her dad has colon polyps diagnosed in his 50s.  Genitourinary:  Negative for difficulty urinating.   Negative except as stated above  PHYSICAL EXAM: BP 118/81   Pulse 95   Resp 16   Ht 5\' 7"  (1.702 m)   Wt 255 lb 12.8 oz (116 kg)   LMP  (LMP Unknown)   SpO2 100%   BMI 40.06 kg/m   Wt Readings from Last 3 Encounters:  10/05/21 255 lb 12.8 oz (116 kg)  08/10/21 279 lb 6.4 oz (126.7 kg)  07/04/21 272 lb 9.6 oz (123.7 kg)    Physical Exam  General appearance - alert, well appearing, and in no distress Mental status - normal mood, behavior, speech, dress, motor activity, and thought processes Eyes - pupils equal and reactive, extraocular eye movements intact Ears - bilateral  TM's and external ear canals normal Nose - normal and patent, no erythema, discharge or polyps Mouth - mucous membranes moist, pharynx normal without lesions Neck - supple, no significant adenopathy Lymphatics - no palpable lymphadenopathy, no hepatosplenomegaly Chest - clear to auscultation, no wheezes, rales or rhonchi, symmetric air entry Heart - normal rate, regular rhythm, normal S1, S2, no murmurs, rubs, clicks or gallops Abdomen - soft, nontender, nondistended, no masses or organomegaly Breasts -CMA Jay'a Pollock present for breast exam: Breasts appear normal, no suspicious masses, no skin or nipple changes or axillary nodes Extremities - peripheral pulses normal, no pedal edema, no clubbing or cyanosis      Latest Ref Rng & Units 03/29/2021    3:14 PM 04/28/2016    6:36 PM 02/26/2013    1:54 AM  CMP  Glucose 70 - 99 mg/dL 83  90  02/28/2013   BUN 6 - 24 mg/dL 13  14  14    Creatinine 0.57 - 1.00 mg/dL 696   2.95   Sodium 134 - 144 mmol/L 141  141  143   Potassium 3.5 - 5.2 mmol/L 4.2  3.7  3.9   Chloride 96 - 106 mmol/L 103  107  104   CO2 20 - 29 mmol/L 20  25  22    Calcium 8.7 - 10.2 mg/dL 9.5  9.5  8.9   Total Protein 6.0 - 8.5 g/dL 7.3  8.3  7.5   Total Bilirubin 0.0 - 1.2 mg/dL 2.84  0.5  0.3   Alkaline Phos 44 - 121 IU/L 114  67  113   AST 0 - 40 IU/L 15  21  51   ALT 0 - 32 IU/L 14  18  26     Lipid Panel     Component Value Date/Time   CHOL 224 (H) 03/29/2021 1514   TRIG 173 (H) 03/29/2021 1514   HDL  53 03/29/2021 1514   CHOLHDL 4.2 03/29/2021 1514   LDLCALC 140 (H) 03/29/2021 1514    CBC    Component Value Date/Time   WBC 10.1 03/29/2021 1514   WBC 9.4 04/28/2016 1836   RBC 5.10 03/29/2021 1514   RBC 4.61 04/28/2016 1836   HGB 12.1 03/29/2021 1514   HCT 37.9 03/29/2021 1514   PLT 263 03/29/2021 1514   MCV 74 (L) 03/29/2021 1514   MCH 23.7 (L) 03/29/2021 1514   MCH 27.1 04/28/2016 1836   MCHC 31.9 03/29/2021 1514   MCHC 33.2 04/28/2016 1836   RDW  15.4 03/29/2021 1514   LYMPHSABS 2.1 02/26/2013 0154   MONOABS 0.6 02/26/2013 0154   EOSABS 0.2 02/26/2013 0154   BASOSABS 0.0 02/26/2013 0154    ASSESSMENT AND PLAN:  1. Annual physical exam -Advised her to get routine eye exam at least every 2 years. Continue routine dental cleaning at least twice a year. Discussed breast cancer screening.  The age to start varies depending on which health organization guidelines you look at.  Gynecology and [employer] of surgeons usually recommend starting at age 13.  However breasts tend to be more dense in women in their early 47s compared to women in their 64s.  This can lead to greater false negatives.  However several weeks ago revised guidelines encouraged well woman to start screening at age 81.  Patient has decided to hold off on getting another mammogram at this time given which she went through a year or 2 ago with a false positive that lead to her having bx that was negative.  -Patient declined flu shot today.  She plans to get it when it is offered by her employer for free next month.  2. Morbid obesity with body mass index (BMI) of 40.0 to 44.9 in adult (HCC) -Weight is down 24 pounds in the past 2 months since being on Wegovy.  We discussed perhaps going back to the 0.25 mg dose so that she does not lose too much weight too quickly.  However patient feels comfortable staying on the 0.5 mg for now.  She feels she lost about 7 to 10 pounds last week when she had an acute illness going on.  She will let me know if she has any further issues with nausea or diarrhea. -Encouraged her to avoid skipping meals. Continue regular exercise.  3. Family history of polyps in the colon We will send her to GI to assess need for early colon CA screen given fhx - Ambulatory referral to Gastroenterology  4. HIV screening declined Patient not sexually active  5. Screening for hepatitis C declined Patient not sexually active.    Patient was given the  opportunity to ask questions.  Patient verbalized understanding of the plan and was able to repeat key elements of the plan.   This documentation was completed using Paediatric nurse.  Any transcriptional errors are unintentional.  Orders Placed This Encounter  Procedures   Ambulatory referral to Gastroenterology     Requested Prescriptions    No prescriptions requested or ordered in this encounter    Return in about 4 months (around 02/04/2022).  Jonah Blue, MD, FACP

## 2021-10-05 NOTE — Patient Instructions (Signed)

## 2021-10-08 ENCOUNTER — Ambulatory Visit: Payer: 59 | Admitting: Psychiatry

## 2021-10-08 NOTE — Progress Notes (Unsigned)
      [facility] Counselor/Therapist Progress Note  Patient ID: Wendy Maynard, MRN: [id number],    Date: 10/08/2021  Time Spent: ***   Treatment Type: {CHL AMB THERAPY TYPES:[id number]}  Reported Symptoms: ***  Mental Status Exam:  Appearance:   {PSY:22683}     Behavior:  {PSY:21022743}  Motor:  {PSY:22302}  Speech/Language:   {PSY:22685}  Affect:  {PSY:22687}  Mood:  {PSY:31886}  Thought process:  {PSY:31888}  Thought content:    {PSY:[phone]}  Sensory/Perceptual disturbances:    {PSY:249-132-5216}  Orientation:  {PSY:30297}  Attention:  {PSY:22877}  Concentration:  {[phone]}  Memory:  {[phone]}  Fund of knowledge:   {[phone]}  Insight:    {[phone]}  Judgment:   {[phone]}  Impulse Control:  {[phone]}   Risk Assessment: Danger to Self:  {PSY:22692} Self-injurious Behavior: {PSY:22692} Danger to Others: {PSY:22692} Duty to Warn:{PSY:311194} Physical Aggression / Violence:{PSY:21197} Access to Firearms a concern: {PSY:21197} Gang Involvement:{PSY:21197}  Subjective: ***  Interventions: {PSY:[phone]}  Diagnosis:No diagnosis found.  Plan: ***   Mathis Fare, LCSW

## 2021-10-22 ENCOUNTER — Ambulatory Visit: Payer: 59 | Admitting: Psychiatry

## 2021-10-29 ENCOUNTER — Other Ambulatory Visit: Payer: Self-pay | Admitting: Family Medicine

## 2021-10-29 ENCOUNTER — Encounter: Payer: Self-pay | Admitting: Internal Medicine

## 2021-10-29 ENCOUNTER — Other Ambulatory Visit (HOSPITAL_BASED_OUTPATIENT_CLINIC_OR_DEPARTMENT_OTHER): Payer: Self-pay

## 2021-10-29 MED ORDER — WEGOVY 1 MG/0.5ML ~~LOC~~ SOAJ
1.0000 mg | SUBCUTANEOUS | 1 refills | Status: DC
  Filled 2021-10-29: qty 2, 28d supply, fill #0

## 2021-10-30 ENCOUNTER — Other Ambulatory Visit (HOSPITAL_BASED_OUTPATIENT_CLINIC_OR_DEPARTMENT_OTHER): Payer: Self-pay

## 2021-10-31 ENCOUNTER — Other Ambulatory Visit (HOSPITAL_COMMUNITY): Payer: Self-pay

## 2021-11-02 ENCOUNTER — Other Ambulatory Visit: Payer: Self-pay | Admitting: Family Medicine

## 2021-11-02 ENCOUNTER — Other Ambulatory Visit (HOSPITAL_BASED_OUTPATIENT_CLINIC_OR_DEPARTMENT_OTHER): Payer: Self-pay

## 2021-11-02 ENCOUNTER — Encounter (HOSPITAL_BASED_OUTPATIENT_CLINIC_OR_DEPARTMENT_OTHER): Payer: 59 | Admitting: Internal Medicine

## 2021-11-02 DIAGNOSIS — R0789 Other chest pain: Secondary | ICD-10-CM

## 2021-11-02 DIAGNOSIS — R0781 Pleurodynia: Secondary | ICD-10-CM | POA: Diagnosis not present

## 2021-11-02 DIAGNOSIS — K2970 Gastritis, unspecified, without bleeding: Secondary | ICD-10-CM | POA: Diagnosis not present

## 2021-11-02 DIAGNOSIS — M62838 Other muscle spasm: Secondary | ICD-10-CM

## 2021-11-02 MED ORDER — CYCLOBENZAPRINE HCL 10 MG PO TABS
10.0000 mg | ORAL_TABLET | Freq: Every day | ORAL | 0 refills | Status: DC | PRN
  Filled 2021-11-02 – 2021-11-26 (×2): qty 30, 30d supply, fill #0

## 2021-11-02 MED ORDER — LIDOCAINE 5 % EX PTCH
1.0000 | MEDICATED_PATCH | CUTANEOUS | 0 refills | Status: DC
  Filled 2021-11-02: qty 30, 30d supply, fill #0

## 2021-11-02 NOTE — Telephone Encounter (Signed)

## 2021-11-05 ENCOUNTER — Encounter: Payer: Self-pay | Admitting: Physician Assistant

## 2021-11-05 ENCOUNTER — Ambulatory Visit (INDEPENDENT_AMBULATORY_CARE_PROVIDER_SITE_OTHER): Payer: 59 | Admitting: Psychiatry

## 2021-11-05 DIAGNOSIS — F411 Generalized anxiety disorder: Secondary | ICD-10-CM | POA: Diagnosis not present
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
## 2021-11-06 ENCOUNTER — Encounter: Payer: Self-pay | Admitting: Internal Medicine

## 2021-11-06 ENCOUNTER — Ambulatory Visit: Payer: 59 | Admitting: Internal Medicine

## 2021-11-06 ENCOUNTER — Ambulatory Visit: Payer: 59 | Attending: Internal Medicine | Admitting: Internal Medicine

## 2021-11-06 ENCOUNTER — Other Ambulatory Visit (HOSPITAL_BASED_OUTPATIENT_CLINIC_OR_DEPARTMENT_OTHER): Payer: Self-pay

## 2021-11-06 ENCOUNTER — Telehealth: Payer: Self-pay | Admitting: Emergency Medicine

## 2021-11-06 ENCOUNTER — Other Ambulatory Visit: Payer: Self-pay

## 2021-11-06 VITALS — BP 126/89 | HR 92 | Ht 67.0 in | Wt 255.6 lb

## 2021-11-06 DIAGNOSIS — S20211A Contusion of right front wall of thorax, initial encounter: Secondary | ICD-10-CM

## 2021-11-06 DIAGNOSIS — R112 Nausea with vomiting, unspecified: Secondary | ICD-10-CM | POA: Diagnosis not present

## 2021-11-06 MED ORDER — WEGOVY 0.25 MG/0.5ML ~~LOC~~ SOAJ
0.2500 mg | SUBCUTANEOUS | 1 refills | Status: DC
  Filled 2021-11-06: qty 2, 28d supply, fill #0
  Filled 2022-03-20: qty 2, 28d supply, fill #1

## 2021-11-06 MED ORDER — TRAMADOL HCL 50 MG PO TABS
50.0000 mg | ORAL_TABLET | Freq: Three times a day (TID) | ORAL | 0 refills | Status: DC | PRN
  Filled 2021-11-06 – 2021-11-08 (×2): qty 30, 10d supply, fill #0

## 2021-11-06 NOTE — Progress Notes (Signed)
Patient ID: Wendy Maynard, female    DOB: 02-02-1976  MRN: [id number]  CC: RIB CAGE PAIN   Subjective: Wendy Maynard is a 46 y.o. female who presents for UC visit Her concerns today include:  Patient with history of OSA on CPAP, obesity, prediabetes, HL,MDD, ADHD, PTSD (plugged in with behavioral health services), migraines with aura, HRT on estradiol and glycopyrrolate  Patient presents today complaining of right-sided rib pain.  Fell last wk on RT side while walking up the steps going to her apartment.  Was wearing flip flops and tripped over the flip flops and her dog; landed on ground on RT side. Pain started after that.  Thought she had bruised herself.  Got worse over night and seen at [facility] the following day.  Had negative rib x-rays there.  Told to f/u with PCP.   Sent Mychart message last wk c/o rib pain and reporting that she was seen at [facility] that day for it.  Dr. Alvis Lemmings who was covering for me last wk, replied to her Mychart message and prescribed Lidocaine patches and Flexeril.  Pt did not fill.  Has Flexeril already and used Lidoderm patches OTC.  Also used heat and ice, Tylenol and ibuprofen.  Did not help. Hurts to breath, sit, bend, lift.  Requesting something stronger for pain. She has been having some vomiting almost daily wk before last and some last week.  She wondered whether this may have contributed to the rib pain as well.  Reports that the pharmacy was out of the [facility] 0.5 mg so she requested increase to 1 mg which Dr. Alvis Lemmings did for her last week.  She thinks the vomiting was due to her trying to eat large amounts on the wrong things while being on Wegovy.  She has changed her eating to 5 smaller meals a day and has not had any vomiting in the past several days.   Patient Active Problem List   Diagnosis Date Noted   Migraine with aura and without status migrainosus, not intractable 03/29/2021   Chronic pain of right knee 03/29/2021   Morbid obesity with body mass index  (BMI) of 40.0 to 44.9 in adult (HCC) 03/29/2021   OSA on CPAP 03/29/2021   Postmenopausal hormone replacement therapy 03/29/2021   Neck muscle spasm 03/29/2021   Elevated blood pressure reading without diagnosis of hypertension 03/29/2021   Snoring 12/07/2020   Excessive sleepiness 12/07/2020   Fatigue 12/07/2020   Insomnia 12/07/2020   Sleep paralysis 12/07/2020   ADHD, predominantly inattentive type 02/06/2018   Posttraumatic stress disorder 02/06/2018   MDD (major depressive disorder), recurrent severe, without psychosis (HCC) 04/28/2016   Chronic calculous cholecystitis 03/23/2013   Cholecystitis 02/26/2013     Current Outpatient Medications on File Prior to Visit  Medication Sig Dispense Refill   ALPRAZolam (XANAX) 0.5 MG tablet Take 1 tablet (0.5 mg total) by mouth 3 (three) times daily as needed for anxiety. 90 tablet 5   [START ON 11/22/2021] amphetamine-dextroamphetamine (ADDERALL) 15 MG tablet Take 1 tablet by mouth 2 (two) times daily. 60 tablet 0   amphetamine-dextroamphetamine (ADDERALL) 15 MG tablet Take 1 tablet by mouth 2 (two) times daily. 60 tablet 0   Ascorbic Acid (VITAMIN C) 100 MG tablet Take 100 mg by mouth daily.     cyclobenzaprine (FLEXERIL) 10 MG tablet Take 1 tablet (10 mg total) by mouth daily as needed. 30 tablet 0   estradiol (ESTRACE) 1 MG tablet Take 1 tablet (1 mg total)  by mouth daily. 30 tablet 0   fexofenadine (ALLEGRA) 60 MG tablet Take 60 mg by mouth 2 (two) times daily as needed.     glycopyrrolate (ROBINUL) 1 MG tablet Take 1 tablet (1 mg total) by mouth 2 (two) times daily. 60 tablet 4   lidocaine (LIDODERM) 5 % Place 1 patch onto the skin daily. Remove & Discard patch within 12 hours or as directed by MD 30 patch 0   meloxicam (MOBIC) 15 MG tablet Take 1 tablet (15 mg total) by mouth daily. 30 tablet 0   Multiple Vitamin (MULTIVITAMIN) tablet Take 1 tablet by mouth daily.     Semaglutide-Weight Management (WEGOVY) 1 MG/0.5ML SOAJ Inject 1 mg  into the skin once a week. 2 mL 1   sertraline (ZOLOFT) 100 MG tablet Take 2 tablets (200 mg total) by mouth daily. 180 tablet 1   zolpidem (AMBIEN) 10 MG tablet Take 1 tablet (10 mg total) by mouth at bedtime as needed for sleep. 30 tablet 5   amphetamine-dextroamphetamine (ADDERALL) 15 MG tablet Take 1 tablet by mouth 2 (two) times daily. 60 tablet 0   No current facility-administered medications on file prior to visit.    Allergies  Allergen Reactions   Ciprofloxacin Hives and Other (See Comments)    Started in arm as soon as drug started.   Clindamycin/Lincomycin Diarrhea and Other (See Comments)    Causes excessive diarrhea   Vicodin [Hydrocodone-Acetaminophen] Itching and Nausea And Vomiting   Penicillins Rash and Other (See Comments)    Occurred at 46 years old Has patient had a PCN reaction causing immediate rash, facial/tongue/throat swelling, SOB or lightheadedness with hypotension: yes Has patient had a PCN reaction causing severe rash involving mucus membranes or skin necrosis: no Has patient had a PCN reaction that required hospitalization no Has patient had a PCN reaction occurring within the last 10 years:no If all of the above answers are "NO", then may proceed with Cephalosporin use.    Sulfa Antibiotics Rash    Social History   Socioeconomic History   Marital status: Single    Spouse name: Not on file   Number of children: 0   Years of education: Not on file   Highest education level: Master's degree (e.g., MA, MS, MEng, MEd, MSW, MBA)  Occupational History   Occupation: Retail buyer at [employer]  Tobacco Use   Smoking status: Former    Types: Cigarettes    Quit date: 01/2021    Years since quitting: 0.8   Smokeless tobacco: Never   Tobacco comments:    plans to start nicotine gum  Vaping Use   Vaping Use: Never used  Substance and Sexual Activity   Alcohol use: Yes    Comment: Occasionally 1-2 x/mth   Drug use: No    Types: Marijuana    Comment: Last  use 04/27/16   Sexual activity: Not Currently    Birth control/protection: Condom  Other Topics Concern   Not on file  Social History Narrative   Not on file   Social Determinants of Health   Financial Resource Strain: Not on file  Food Insecurity: Not on file  Transportation Needs: Not on file  Physical Activity: Not on file  Stress: Not on file  Social Connections: Not on file  Intimate Partner Violence: Not on file    Family History  Problem Relation Age of Onset   Depression Mother        in response to grief/loss of husband  Hypertension Father        Died sudenly at age 5. Reports father may have had Aspergers   Depression Sister    Anxiety disorder Sister    ADD / ADHD Sister    ADD / ADHD Cousin    Asperger's syndrome Cousin     Past Surgical History:  Procedure Laterality Date   CHOLECYSTECTOMY N/A 02/26/2013   Procedure: LAPAROSCOPIC CHOLECYSTECTOMY WITH INTRAOPERATIVE CHOLANGIOGRAM;  Surgeon: Adolph Pollack, MD;  Location: [facility];  Service: General;  Laterality: N/A;   HYSTERECTOMY ABDOMINAL WITH SALPINGECTOMY     WISDOM TOOTH EXTRACTION      ROS: Review of Systems Negative except as stated above  PHYSICAL EXAM: BP 126/89   Pulse 92   Ht 5\' 7"  (1.702 m)   Wt 255 lb 9.6 oz (115.9 kg)   LMP  (LMP Unknown)   SpO2 96%   BMI 40.03 kg/m   Wt Readings from Last 3 Encounters:  11/06/21 255 lb 9.6 oz (115.9 kg)  10/05/21 255 lb 12.8 oz (116 kg)  08/10/21 279 lb 6.4 oz (126.7 kg)    Physical Exam  General appearance - alert, well appearing, middle-age Caucasian female and in no distress Mental status - normal mood, behavior, speech, dress, motor activity, and thought processes Chest -lung sounds are clear bilaterally.  She has some splinting on the right side when she takes a deep breath. Abdomen - soft, nontender, nondistended, no masses or organomegaly Musculoskeletal -patient with large area of ecchymosis on the right lower lateral rib cage.   Moderate tenderness on palpation of this area.  See picture below.        Latest Ref Rng & Units 03/29/2021    3:14 PM 04/28/2016    6:36 PM 02/26/2013    1:54 AM  CMP  Glucose 70 - 99 mg/dL 83  90  02/28/2013   BUN 6 - 24 mg/dL 13  14  14    Creatinine 0.57 - 1.00 mg/dL 829   5.62   Sodium 134 - 144 mmol/L 141  141  143   Potassium 3.5 - 5.2 mmol/L 4.2  3.7  3.9   Chloride 96 - 106 mmol/L 103  107  104   CO2 20 - 29 mmol/L 20  25  22    Calcium 8.7 - 10.2 mg/dL 9.5  9.5  8.9   Total Protein 6.0 - 8.5 g/dL 7.3  8.3  7.5   Total Bilirubin 0.0 - 1.2 mg/dL 1.30  0.5  0.3   Alkaline Phos 44 - 121 IU/L 114  67  113   AST 0 - 40 IU/L 15  21  51   ALT 0 - 32 IU/L 14  18  26     Lipid Panel     Component Value Date/Time   CHOL 224 (H) 03/29/2021 1514   TRIG 173 (H) 03/29/2021 1514   HDL 53 03/29/2021 1514   CHOLHDL 4.2 03/29/2021 1514   LDLCALC 140 (H) 03/29/2021 1514    CBC    Component Value Date/Time   WBC 10.1 03/29/2021 1514   WBC 9.4 04/28/2016 1836   RBC 5.10 03/29/2021 1514   RBC 4.61 04/28/2016 1836   HGB 12.1 03/29/2021 1514   HCT 37.9 03/29/2021 1514   PLT 263 03/29/2021 1514   MCV 74 (L) 03/29/2021 1514   MCH 23.7 (L) 03/29/2021 1514   MCH 27.1 04/28/2016 1836   MCHC 31.9 03/29/2021 1514   MCHC 33.2 04/28/2016 1836   RDW  15.4 03/29/2021 1514   LYMPHSABS 2.1 02/26/2013 0154   MONOABS 0.6 02/26/2013 0154   EOSABS 0.2 02/26/2013 0154   BASOSABS 0.0 02/26/2013 0154    ASSESSMENT AND PLAN: 1. Contusion of rib on right side, initial encounter -I offered to repeat rib x-rays to see if there is a fracture.  However I told patient that even if there is a fracture we will treat conservatively with pain management until it heals. -Advised to use a heating pad. I have given a limited supply of tramadol to use as needed.  Advised that the medication can cause some drowsiness. - traMADol (ULTRAM) 50 MG tablet; Take 1 tablet (50 mg total) by mouth every 8 (eight) hours as  needed.  Dispense: 30 tablet; Refill: 0  2. Nausea and vomiting, unspecified vomiting type Medication induced from [facility]. We discussed either stopping the medication or decreasing the dose.  She is currently on 1 mg.  Patient does not want to stop the medication.  She feels eating smaller but more frequent meals have helped.  She did well on the starter dose of 0.25 mg initially and still had weight loss at that dose.  I recommend that we decrease the dose back to the starting dose of 0.25 mg once a week.  Patient is agreeable to this.    Patient was given the opportunity to ask questions.  Patient verbalized understanding of the plan and was able to repeat key elements of the plan.   This documentation was completed using [employer].  Any transcriptional errors are unintentional.  No orders of the defined types were placed in this encounter.    Requested Prescriptions    No prescriptions requested or ordered in this encounter    No follow-ups on file.  Karle Plumber, MD, FACP

## 2021-11-06 NOTE — Progress Notes (Signed)
Pain in right rib cage Had fall last week

## 2021-11-08 ENCOUNTER — Other Ambulatory Visit (HOSPITAL_COMMUNITY): Payer: Self-pay

## 2021-11-12 ENCOUNTER — Telehealth: Payer: Self-pay | Admitting: Psychiatry

## 2021-11-12 ENCOUNTER — Other Ambulatory Visit (HOSPITAL_BASED_OUTPATIENT_CLINIC_OR_DEPARTMENT_OTHER): Payer: Self-pay

## 2021-11-12 DIAGNOSIS — F332 Major depressive disorder, recurrent severe without psychotic features: Secondary | ICD-10-CM

## 2021-11-12 MED ORDER — CARIPRAZINE HCL 1.5 MG PO CAPS
1.5000 mg | ORAL_CAPSULE | Freq: Every day | ORAL | 1 refills | Status: DC
  Filled 2021-11-12: qty 30, 30d supply, fill #0

## 2021-11-12 NOTE — Telephone Encounter (Signed)
Called pt to find out more information. She has noticed more depression. She lost 30 lbs in less than 3 months with severe n/v and diarrhea with OMAYOK. She reports that she is no longer vomiting now that she is on a lower dose. She reports that she has increased anxiety with panic. She reports that she has had a feeling as if the other shoe is going to drop.   Sister has been on Vraylar and had a positive response.   Discussed potential benefits, risks, and side effects of Vraylar to include potential risks of TD and metabolic side effects.   Patient advised to contact office with any questions, adverse effects, or acute worsening in signs and symptoms.

## 2021-11-12 NOTE — Telephone Encounter (Signed)
Pt called reporting starting to feel depressed again for about a week now. Should she start taking Abilify again? Draw [employer]. Apt 10/25

## 2021-11-12 NOTE — Telephone Encounter (Signed)
Patient said she has just been feeling down for about a week. No new stressors. Your last note mentions trying Rexulti or Vraylar due to possibility of less weight gain. I see that she has canceled several appt with Jackelyn Poling and asked if she thought that might be adding to her depression and she said it might. She said she had recently started [facility] and she was having a lot of nausea and has canceled appt due to that.

## 2021-11-13 ENCOUNTER — Other Ambulatory Visit (HOSPITAL_BASED_OUTPATIENT_CLINIC_OR_DEPARTMENT_OTHER): Payer: Self-pay

## 2021-11-14 ENCOUNTER — Other Ambulatory Visit (HOSPITAL_BASED_OUTPATIENT_CLINIC_OR_DEPARTMENT_OTHER): Payer: Self-pay

## 2021-11-15 ENCOUNTER — Other Ambulatory Visit: Payer: Self-pay | Admitting: Internal Medicine

## 2021-11-15 ENCOUNTER — Other Ambulatory Visit (HOSPITAL_BASED_OUTPATIENT_CLINIC_OR_DEPARTMENT_OTHER): Payer: Self-pay

## 2021-11-15 DIAGNOSIS — S20211A Contusion of right front wall of thorax, initial encounter: Secondary | ICD-10-CM

## 2021-11-15 MED ORDER — TRAMADOL HCL 50 MG PO TABS
50.0000 mg | ORAL_TABLET | Freq: Three times a day (TID) | ORAL | 0 refills | Status: DC | PRN
  Filled 2021-11-15: qty 30, 10d supply, fill #0

## 2021-11-16 ENCOUNTER — Other Ambulatory Visit (HOSPITAL_BASED_OUTPATIENT_CLINIC_OR_DEPARTMENT_OTHER): Payer: Self-pay

## 2021-11-19 ENCOUNTER — Ambulatory Visit (INDEPENDENT_AMBULATORY_CARE_PROVIDER_SITE_OTHER): Payer: 59 | Admitting: Psychiatry

## 2021-11-19 DIAGNOSIS — F411 Generalized anxiety disorder: Secondary | ICD-10-CM | POA: Diagnosis not present

## 2021-11-19 NOTE — Progress Notes (Signed)
[facility] Counselor/Therapist Progress Note  Patient ID: Wendy Maynard, MRN: [id number],    Date: 11/19/2021  Time Spent: 55 minutes   Treatment Type: Individual Therapy  Reported Symptoms: anxiety, depression  Mental Status Exam:  Appearance:   Casual     Behavior:  Appropriate, Sharing, and Motivated  Motor:  Normal  Speech/Language:   Clear and Coherent  Affect:  Depressed and anxious , sad  Mood:  anxious and depressed, sad  Thought process:  goal directed  Thought content:    WNL  Sensory/Perceptual disturbances:    WNL  Orientation:  oriented to person, place, time/date, situation, day of week, month of year, year, and stated date of Oct. 9, 2023  Attention:  Good  Concentration:  Good  Memory:  WNL  Fund of knowledge:   Good  Insight:    Good  Judgment:   Good  Impulse Control:  Good   Risk Assessment: Danger to Self:  No Self-injurious Behavior: No Danger to Others: No Duty to Warn:no Physical Aggression / Violence:No  Access to Firearms a concern: No  Gang Involvement:No   Subjective:   Patient in today reporting anxiety, frustration, sadness, and fears for friends in [place] in midst of current war. Patient lived in [place] earlier in her life and still has friends there. Very upset today about current war activity there. Emotional, had not been sleeping well, teary, upset, venting freely her concerns, very concerned about people she knows who are still living over in [place]. Needed most of session to process and talk through her feelings and fears for the people of [place].  "Have been so flooded with my feelings since all this happened in [place]. Is sleeping some better as of last night. Discussed what would be helpful to her: eating healthy, get more in a routine, staying in contact with a few people as "I'm an introvert", therapy, walking some, sleep, do activities at home she enjoys (reading, puzzles, learning something new). Still on Wegovy for weight loss  and is gradually losing still. Worked last session on her "sense of brokenness" and stated she would talk about that more next time but today really needed to talk through and process her thoughts/feelings/concerns for the people of [place].  Did encourage her to use journaling between now and next session if she felt that would be helpful.   Interventions: Cognitive Behavioral Therapy and Ego-Supportive  Treatment Goals: Treatment goals remain on treatment as patient works with strategies to achieve her goals.  Progress is evaluated each session and documented in the "progress" section of note. Long term goal: Stabilize anxiety level while increasing ability to function on a daily basis. Short term goal: Identify major life conflicts from the past and present that form the basis for present anxiety. Strategies: Reinforced client's insights into the role of her past emotional pain and present anxiety.  Support patient's work in further resolving these issues.   Diagnosis:   ICD-10-CM   1. Generalized anxiety disorder  F41.1      Plan:  Patient in today showing good participation and motivation in session focusing as noted above on her sadness, anxiety, frustration, and fears for the people she knows still and is role after she has spent part of her younger adult life working [place].  Was very upset today when she arrived about the situation and did a good job of letting her emotions out in a safe place, venting her concerns,  and sharing her fears over the next few days.  Denies any SI.  Was more calm and grounded by the end of session.  Patient knows she can contact our office if needed before next session which is within 1 to 2 weeks.  To continue next visit working with these issues as well as issues she was previously working on prior to this incident happening in [place].  Continues her overall goal of "wanting to make peace with who I am and wanting to be able to  love myself for who I am".  Showing definite progress gradually. Encouraged patient in practicing more positive behaviors as noted in session including: Reflecting more on her progress already made, get outside daily to walk, continue moving further away from the past and more into her present/future with goal directed behaviors, believing more in herself and her ability to make positive changes, creating some downtime for herself each day, continue journaling between sessions, positive self talk, interacting more with coworkers in positive ways, intentionally looking for more positives versus negatives, searching for the positives within herself and be able to name them, allowing for good sleep patterns, and realize the strength that she shows when working with goal directed behaviors to move in a direction that supports her improved emotional health and overall wellbeing.  Goal review and progress/challenges noted with patient.  Next appointment within 2 to 3 weeks.  This record has been created using [employer].  Chart creation errors have been sought, but may not always have been located and corrected.  Such creation errors do not reflect on the standard of medical care provided.   Mathis Fare, LCSW

## 2021-11-21 NOTE — Telephone Encounter (Signed)
Error

## 2021-11-26 ENCOUNTER — Other Ambulatory Visit: Payer: Self-pay | Admitting: Internal Medicine

## 2021-11-26 ENCOUNTER — Other Ambulatory Visit (HOSPITAL_BASED_OUTPATIENT_CLINIC_OR_DEPARTMENT_OTHER): Payer: Self-pay

## 2021-11-26 DIAGNOSIS — Z79890 Hormone replacement therapy (postmenopausal): Secondary | ICD-10-CM

## 2021-11-26 MED ORDER — ESTRADIOL 1 MG PO TABS
1.0000 mg | ORAL_TABLET | Freq: Every day | ORAL | 4 refills | Status: DC
  Filled 2021-11-26: qty 30, 30d supply, fill #0
  Filled 2022-01-23: qty 30, 30d supply, fill #1
  Filled 2022-02-19: qty 30, 30d supply, fill #2
  Filled 2022-03-20 (×2): qty 30, 30d supply, fill #3
  Filled 2022-05-27 – 2022-06-03 (×2): qty 30, 30d supply, fill #4

## 2021-11-27 ENCOUNTER — Other Ambulatory Visit (HOSPITAL_BASED_OUTPATIENT_CLINIC_OR_DEPARTMENT_OTHER): Payer: Self-pay

## 2021-11-27 ENCOUNTER — Ambulatory Visit: Payer: 59 | Attending: Internal Medicine | Admitting: Internal Medicine

## 2021-11-27 ENCOUNTER — Encounter: Payer: Self-pay | Admitting: Internal Medicine

## 2021-11-27 VITALS — BP 122/80 | HR 85 | Temp 98.0°F | Ht 67.0 in | Wt 246.8 lb

## 2021-11-27 DIAGNOSIS — E8941 Symptomatic postprocedural ovarian failure: Secondary | ICD-10-CM

## 2021-11-27 DIAGNOSIS — G43109 Migraine with aura, not intractable, without status migrainosus: Secondary | ICD-10-CM | POA: Diagnosis not present

## 2021-11-27 DIAGNOSIS — Z6838 Body mass index (BMI) 38.0-38.9, adult: Secondary | ICD-10-CM | POA: Diagnosis not present

## 2021-11-27 DIAGNOSIS — R61 Generalized hyperhidrosis: Secondary | ICD-10-CM | POA: Diagnosis not present

## 2021-11-27 NOTE — Progress Notes (Signed)
Patient ID: Wendy Maynard, female    DOB: [birth year]  MRN: [id number]  CC: Follow-up (Medication f/u. Pt would like to discuss status on FMLA paperwork. Dallie Piles received flu vax this season. )   Subjective: Wendy Maynard is a 46 y.o. female who presents for f/u obesity Her concerns today include:  Patient with history of OSA on CPAP, obesity, prediabetes, HL,MDD, ADHD, PTSD (plugged in with behavioral health services), migraines with aura, HRT on estradiol and glycopyrrolate  Obesity: on last visit, we went back to the 0.25 mg of Wegovy.  However, she has not been on it since I last saw her 11/06/21. Wanted to give herself a brake from the med.  Eating small portions and more frequent meals and drinking a lot of water and Gatorade Zero calorie.  Walks her dog totol 25 mins a day.  Taking stairs more at work. Down 9 lbs in last 3 wks.   Still wants to go back on lowest dose of Wegovy as was planned on last visit.     Wants FMLA completed for migraines for intermittent leave.  Would like once a mth for 2 days as a just in case.  Does not get migraines that often, but when she does have 1, it carries over into the next day. -had 4 episodes where she had to be out of work last yr for migraines. Has migraine once every couple of mths Takes 2 Tylenol ES, Motrin 800 mg, can of Coke and sleep with good results.  Use to take Relpax in past but does not feel she needs it at this time as she gets good relief with a combination of Tylenol, ibuprofen and drinking some caffeine..    Requesting to increase estrogen to twice a day dosing.  Reports that she use to be on Estrogen 2 times a day.  Sweats a lot worse in mornings.  Unbearably hot a lot.  Has episodes where skin cold but inside sweating and face red.  She is on she is on glycopyrrolate and Estrace 1 mg daily.  HM:  got flu shot at work wk before last.  Patient Active Problem List   Diagnosis Date Noted   Migraine with aura and without status  migrainosus, not intractable 03/29/2021   Chronic pain of right knee 03/29/2021   Morbid obesity with body mass index (BMI) of 40.0 to 44.9 in adult (HCC) 03/29/2021   OSA on CPAP 03/29/2021   Postmenopausal hormone replacement therapy 03/29/2021   Neck muscle spasm 03/29/2021   Elevated blood pressure reading without diagnosis of hypertension 03/29/2021   Snoring 12/07/2020   Excessive sleepiness 12/07/2020   Fatigue 12/07/2020   Insomnia 12/07/2020   Sleep paralysis 12/07/2020   ADHD, predominantly inattentive type 02/06/2018   Posttraumatic stress disorder 02/06/2018   MDD (major depressive disorder), recurrent severe, without psychosis (HCC) 04/28/2016   Chronic calculous cholecystitis 03/23/2013   Cholecystitis 02/26/2013     Current Outpatient Medications on File Prior to Visit  Medication Sig Dispense Refill   ALPRAZolam (XANAX) 0.5 MG tablet Take 1 tablet (0.5 mg total) by mouth 3 (three) times daily as needed for anxiety. 90 tablet 5   amphetamine-dextroamphetamine (ADDERALL) 15 MG tablet Take 1 tablet by mouth 2 (two) times daily. 60 tablet 0   amphetamine-dextroamphetamine (ADDERALL) 15 MG tablet Take 1 tablet by mouth 2 (two) times daily. 60 tablet 0   Ascorbic Acid (VITAMIN C) 100 MG tablet Take 100 mg by mouth daily.  cariprazine (VRAYLAR) 1.5 MG capsule Take 1 capsule (1.5 mg total) by mouth daily. 30 capsule 1   cyclobenzaprine (FLEXERIL) 10 MG tablet Take 1 tablet (10 mg total) by mouth daily as needed. 30 tablet 0   estradiol (ESTRACE) 1 MG tablet Take 1 tablet (1 mg total) by mouth daily. 30 tablet 4   fexofenadine (ALLEGRA) 60 MG tablet Take 60 mg by mouth 2 (two) times daily as needed.     glycopyrrolate (ROBINUL) 1 MG tablet Take 1 tablet (1 mg total) by mouth 2 (two) times daily. 60 tablet 4   lidocaine (LIDODERM) 5 % Place 1 patch onto the skin daily. Remove & Discard patch within 12 hours or as directed by MD 30 patch 0   meloxicam (MOBIC) 15 MG tablet Take  1 tablet (15 mg total) by mouth daily. 30 tablet 0   Multiple Vitamin (MULTIVITAMIN) tablet Take 1 tablet by mouth daily.     Semaglutide-Weight Management (WEGOVY) 0.25 MG/0.5ML SOAJ Inject 0.25 mg into the skin once a week. Stop the 1 mg dose. 2 mL 1   sertraline (ZOLOFT) 100 MG tablet Take 2 tablets (200 mg total) by mouth daily. 180 tablet 1   traMADol (ULTRAM) 50 MG tablet Take 1 tablet (50 mg total) by mouth every 8 (eight) hours as needed. 30 tablet 0   zolpidem (AMBIEN) 10 MG tablet Take 1 tablet (10 mg total) by mouth at bedtime as needed for sleep. 30 tablet 5   amphetamine-dextroamphetamine (ADDERALL) 15 MG tablet Take 1 tablet by mouth 2 (two) times daily. 60 tablet 0   No current facility-administered medications on file prior to visit.    Allergies  Allergen Reactions   Ciprofloxacin Hives and Other (See Comments)    Started in arm as soon as drug started.   Clindamycin/Lincomycin Diarrhea and Other (See Comments)    Causes excessive diarrhea   Vicodin [Hydrocodone-Acetaminophen] Itching and Nausea And Vomiting   Penicillins Rash and Other (See Comments)    Occurred at 46 years old Has patient had a PCN reaction causing immediate rash, facial/tongue/throat swelling, SOB or lightheadedness with hypotension: yes Has patient had a PCN reaction causing severe rash involving mucus membranes or skin necrosis: no Has patient had a PCN reaction that required hospitalization no Has patient had a PCN reaction occurring within the last 10 years:no If all of the above answers are "NO", then may proceed with Cephalosporin use.    Sulfa Antibiotics Rash    Social History   Socioeconomic History   Marital status: Single    Spouse name: Not on file   Number of children: 0   Years of education: Not on file   Highest education level: Master's degree (e.g., MA, MS, MEng, MEd, MSW, MBA)  Occupational History   Occupation: Retail buyer at [employer]  Tobacco Use   Smoking status: Former     Types: Cigarettes    Quit date: 01/2021    Years since quitting: 0.8   Smokeless tobacco: Never   Tobacco comments:    plans to start nicotine gum  Vaping Use   Vaping Use: Never used  Substance and Sexual Activity   Alcohol use: Yes    Comment: Occasionally 1-2 x/mth   Drug use: No    Types: Marijuana    Comment: Last use 04/27/16   Sexual activity: Not Currently    Birth control/protection: Condom  Other Topics Concern   Not on file  Social History Narrative   Not on file  Social Determinants of Health   Financial Resource Strain: Not on file  Food Insecurity: Not on file  Transportation Needs: Not on file  Physical Activity: Not on file  Stress: Not on file  Social Connections: Not on file  Intimate Partner Violence: Not on file    Family History  Problem Relation Age of Onset   Depression Mother        in response to grief/loss of husband   Hypertension Father        Died sudenly at age 61. Reports father may have had Aspergers   Depression Sister    Anxiety disorder Sister    ADD / ADHD Sister    ADD / ADHD Cousin    Asperger's syndrome Cousin     Past Surgical History:  Procedure Laterality Date   CHOLECYSTECTOMY N/A 02/26/2013   Procedure: LAPAROSCOPIC CHOLECYSTECTOMY WITH INTRAOPERATIVE CHOLANGIOGRAM;  Surgeon: Odis Hollingshead, MD;  Location: [facility];  Service: General;  Laterality: N/A;   HYSTERECTOMY ABDOMINAL WITH SALPINGECTOMY     WISDOM TOOTH EXTRACTION      ROS: Review of Systems Negative except as stated above  PHYSICAL EXAM: BP 122/80 (BP Location: Left Arm, Patient Position: Sitting, Cuff Size: Normal)   Pulse 85   Temp 98 F (36.7 C) (Oral)   Ht 5\' 7"  (1.702 m)   Wt 246 lb 12.8 oz (111.9 kg)   LMP  (LMP Unknown)   SpO2 96%   BMI 38.65 kg/m   Wt Readings from Last 3 Encounters:  11/27/21 246 lb 12.8 oz (111.9 kg)  11/06/21 255 lb 9.6 oz (115.9 kg)  10/05/21 255 lb 12.8 oz (116 kg)    Physical Exam  General appearance -  alert, well appearing, and in no distress Mental status - alert, oriented to person, place, and time      Latest Ref Rng & Units 03/29/2021    3:14 PM 04/28/2016    6:36 PM 02/26/2013    1:54 AM  CMP  Glucose 70 - 99 mg/dL 83  90  113   BUN 6 - 24 mg/dL 13  14  14    Creatinine 0.57 - 1.00 mg/dL 0.69  0.68  0.70   Sodium 134 - 144 mmol/L 141  141  143   Potassium 3.5 - 5.2 mmol/L 4.2  3.7  3.9   Chloride 96 - 106 mmol/L 103  107  104   CO2 20 - 29 mmol/L 20  25  22    Calcium 8.7 - 10.2 mg/dL 9.5  9.5  8.9   Total Protein 6.0 - 8.5 g/dL 7.3  8.3  7.5   Total Bilirubin 0.0 - 1.2 mg/dL <0.2  0.5  0.3   Alkaline Phos 44 - 121 IU/L 114  67  113   AST 0 - 40 IU/L 15  21  51   ALT 0 - 32 IU/L 14  18  26     Lipid Panel     Component Value Date/Time   CHOL 224 (H) 03/29/2021 1514   TRIG 173 (H) 03/29/2021 1514   HDL 53 03/29/2021 1514   CHOLHDL 4.2 03/29/2021 1514   LDLCALC 140 (H) 03/29/2021 1514    CBC    Component Value Date/Time   WBC 10.1 03/29/2021 1514   WBC 9.4 04/28/2016 1836   RBC 5.10 03/29/2021 1514   RBC 4.61 04/28/2016 1836   HGB 12.1 03/29/2021 1514   HCT 37.9 03/29/2021 1514   PLT 263 03/29/2021 1514   MCV  74 (L) 03/29/2021 1514   MCH 23.7 (L) 03/29/2021 1514   MCH 27.1 04/28/2016 1836   MCHC 31.9 03/29/2021 1514   MCHC 33.2 04/28/2016 1836   RDW 15.4 03/29/2021 1514   LYMPHSABS 2.1 02/26/2013 0154   MONOABS 0.6 02/26/2013 0154   EOSABS 0.2 02/26/2013 0154   BASOSABS 0.0 02/26/2013 0154    ASSESSMENT AND PLAN:  1. Class 2 severe obesity due to excess calories with serious comorbidity and body mass index (BMI) of 38.0 to 38.9 in adult [facility]) Commended her on weight loss and changes in eating habits.  We discussed possible side effects of going back on Wegovy even at the 0.25 mg dose.  She may have reoccurrence of nausea/vomiting.  There is also a risk of bowel blockage with these medications.  She understands the risks.  She agrees that when she restarts  the medication, if she develops vomiting again, she will just stop the medicine and let me know.  2. Migraine with aura and without status migrainosus, not intractable I will complete FMLA form for her once it is received.  She declines a prescription for Relpax.  3. Excessive sweating Check thyroid level. - TSH+T4F+T3Free  4. Hot flashes due to surgical menopause We will refer her to gynecology to see whether increase in Estrace is appropriate at this time. - Ambulatory referral to Gynecology    Patient was given the opportunity to ask questions.  Patient verbalized understanding of the plan and was able to repeat key elements of the plan.   This documentation was completed using Paediatric nurse.  Any transcriptional errors are unintentional.  No orders of the defined types were placed in this encounter.    Requested Prescriptions    No prescriptions requested or ordered in this encounter    No follow-ups on file.  Jonah Blue, MD, FACP

## 2021-11-28 LAB — TSH+T4F+T3FREE
Free T4: 1.3 ng/dL (ref 0.82–1.77)
T3, Free: 3.5 pg/mL (ref 2.0–4.4)
TSH: 2.59 u[IU]/mL (ref 0.450–4.500)

## 2021-11-29 ENCOUNTER — Telehealth: Payer: 59 | Admitting: Internal Medicine

## 2021-12-03 ENCOUNTER — Ambulatory Visit (INDEPENDENT_AMBULATORY_CARE_PROVIDER_SITE_OTHER): Payer: 59 | Admitting: Psychiatry

## 2021-12-03 DIAGNOSIS — F411 Generalized anxiety disorder: Secondary | ICD-10-CM | POA: Diagnosis not present
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
## 2021-12-05 ENCOUNTER — Telehealth (INDEPENDENT_AMBULATORY_CARE_PROVIDER_SITE_OTHER): Payer: 59 | Admitting: Psychiatry

## 2021-12-05 ENCOUNTER — Other Ambulatory Visit (HOSPITAL_BASED_OUTPATIENT_CLINIC_OR_DEPARTMENT_OTHER): Payer: Self-pay

## 2021-12-05 ENCOUNTER — Encounter: Payer: Self-pay | Admitting: Psychiatry

## 2021-12-05 DIAGNOSIS — F4310 Post-traumatic stress disorder, unspecified: Secondary | ICD-10-CM | POA: Diagnosis not present

## 2021-12-05 DIAGNOSIS — G4733 Obstructive sleep apnea (adult) (pediatric): Secondary | ICD-10-CM | POA: Diagnosis not present

## 2021-12-05 DIAGNOSIS — F3342 Major depressive disorder, recurrent, in full remission: Secondary | ICD-10-CM

## 2021-12-05 DIAGNOSIS — F5101 Primary insomnia: Secondary | ICD-10-CM

## 2021-12-05 DIAGNOSIS — F900 Attention-deficit hyperactivity disorder, predominantly inattentive type: Secondary | ICD-10-CM | POA: Diagnosis not present

## 2021-12-05 MED ORDER — SERTRALINE HCL 100 MG PO TABS
200.0000 mg | ORAL_TABLET | Freq: Every day | ORAL | 1 refills | Status: DC
  Filled 2021-12-05: qty 180, 90d supply, fill #0
  Filled 2022-05-27 – 2022-06-03 (×2): qty 180, 90d supply, fill #1

## 2021-12-05 MED ORDER — ZOLPIDEM TARTRATE 10 MG PO TABS
10.0000 mg | ORAL_TABLET | Freq: Every evening | ORAL | 5 refills | Status: DC | PRN
  Filled 2021-12-05 – 2021-12-26 (×2): qty 30, 30d supply, fill #0
  Filled 2022-01-23: qty 30, 30d supply, fill #1
  Filled 2022-02-19 – 2022-02-20 (×3): qty 30, 30d supply, fill #2
  Filled 2022-03-13: qty 30, 30d supply, fill #3
  Filled 2022-05-24: qty 30, 30d supply, fill #4

## 2021-12-05 MED ORDER — AMPHETAMINE-DEXTROAMPHETAMINE 15 MG PO TABS
15.0000 mg | ORAL_TABLET | Freq: Two times a day (BID) | ORAL | 0 refills | Status: DC
  Filled 2021-12-26: qty 60, 30d supply, fill #0

## 2021-12-05 MED ORDER — ALPRAZOLAM 0.5 MG PO TABS
0.5000 mg | ORAL_TABLET | Freq: Three times a day (TID) | ORAL | 5 refills | Status: DC | PRN
  Filled 2021-12-05 – 2021-12-26 (×2): qty 90, 30d supply, fill #0
  Filled 2022-01-23: qty 90, 30d supply, fill #1
  Filled 2022-02-19 – 2022-02-20 (×3): qty 90, 30d supply, fill #2
  Filled 2022-03-20 (×2): qty 90, 30d supply, fill #3
  Filled 2022-05-24: qty 90, 30d supply, fill #4
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
## 2021-12-19 ENCOUNTER — Other Ambulatory Visit (HOSPITAL_BASED_OUTPATIENT_CLINIC_OR_DEPARTMENT_OTHER): Payer: Self-pay

## 2021-12-19 ENCOUNTER — Telehealth (INDEPENDENT_AMBULATORY_CARE_PROVIDER_SITE_OTHER): Payer: 59 | Admitting: Psychiatry

## 2021-12-19 ENCOUNTER — Encounter: Payer: Self-pay | Admitting: Psychiatry

## 2021-12-19 DIAGNOSIS — F5101 Primary insomnia: Secondary | ICD-10-CM

## 2021-12-19 DIAGNOSIS — F332 Major depressive disorder, recurrent severe without psychotic features: Secondary | ICD-10-CM | POA: Diagnosis not present

## 2021-12-19 DIAGNOSIS — F411 Generalized anxiety disorder: Secondary | ICD-10-CM

## 2021-12-19 DIAGNOSIS — F4310 Post-traumatic stress disorder, unspecified: Secondary | ICD-10-CM

## 2021-12-19 MED ORDER — HYDROXYZINE HCL 25 MG PO TABS
25.0000 mg | ORAL_TABLET | Freq: Four times a day (QID) | ORAL | 1 refills | Status: DC | PRN
  Filled 2021-12-19: qty 90, 23d supply, fill #0
  Filled 2022-02-19: qty 90, 23d supply, fill #1

## 2021-12-19 MED ORDER — GABAPENTIN 300 MG PO CAPS
ORAL_CAPSULE | ORAL | 1 refills | Status: DC
  Filled 2021-12-19: qty 90, 30d supply, fill #0
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
## 2021-12-20 NOTE — Telephone Encounter (Unsigned)
Copied from CRM #[id number]. Topic: General - Inquiry >> Dec 20, 2021 10:35 AM Wendy Maynard wrote: Pt is calling regarding her FMLa paperwork and asking if it has been faxed or if she needs to come by and pick it up.  FAX # is [phone]  CB@  [phone]  please call her back 

## 2021-12-24 ENCOUNTER — Telehealth: Payer: Self-pay | Admitting: Internal Medicine

## 2021-12-24 NOTE — Telephone Encounter (Signed)
  Patient is very upset because she has not received a follow up call regarding the status of her FMLA paperwork. Patient states time is sensitive and would like a follow up call regarding the below.   Copied from CRM [id number]. Topic: General - Inquiry >> Dec 20, 2021 10:35 AM Haroldine Laws wrote: Pt is calling regarding her FMLa paperwork and asking if it has been faxed or if she needs to come by and pick it up.  FAX # is [phone]  [id number]  [phone]  please call her back

## 2021-12-24 NOTE — Telephone Encounter (Signed)
Copied from CRM [id number]. Topic: General - Inquiry >> Dec 20, 2021 10:35 AM Wendy Maynard wrote: Pt is calling regarding her FMLa paperwork and asking if it has been faxed or if she needs to come by and pick it up.  FAX # is [phone]  [id number]  [phone]  please call her back

## 2021-12-24 NOTE — Telephone Encounter (Signed)
FMLA paperwork has been re faxed.

## 2021-12-26 ENCOUNTER — Other Ambulatory Visit (HOSPITAL_BASED_OUTPATIENT_CLINIC_OR_DEPARTMENT_OTHER): Payer: Self-pay

## 2021-12-31 ENCOUNTER — Ambulatory Visit (INDEPENDENT_AMBULATORY_CARE_PROVIDER_SITE_OTHER): Payer: 59 | Admitting: Psychiatry

## 2021-12-31 DIAGNOSIS — F411 Generalized anxiety disorder: Secondary | ICD-10-CM
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
## 2022-01-08 ENCOUNTER — Telehealth: Payer: 59 | Admitting: Psychiatry

## 2022-01-10 ENCOUNTER — Other Ambulatory Visit (HOSPITAL_BASED_OUTPATIENT_CLINIC_OR_DEPARTMENT_OTHER): Payer: Self-pay

## 2022-01-10 ENCOUNTER — Telehealth (INDEPENDENT_AMBULATORY_CARE_PROVIDER_SITE_OTHER): Payer: 59 | Admitting: Psychiatry

## 2022-01-10 ENCOUNTER — Encounter: Payer: Self-pay | Admitting: Psychiatry

## 2022-01-10 VITALS — BP 122/88 | HR 85 | Wt 238.0 lb

## 2022-01-10 DIAGNOSIS — F411 Generalized anxiety disorder: Secondary | ICD-10-CM

## 2022-01-10 DIAGNOSIS — F330 Major depressive disorder, recurrent, mild: Secondary | ICD-10-CM

## 2022-01-10 DIAGNOSIS — F4310 Post-traumatic stress disorder, unspecified: Secondary | ICD-10-CM | POA: Diagnosis not present

## 2022-01-10 DIAGNOSIS — F5101 Primary insomnia: Secondary | ICD-10-CM | POA: Diagnosis not present

## 2022-01-10 DIAGNOSIS — F900 Attention-deficit hyperactivity disorder, predominantly inattentive type: Secondary | ICD-10-CM | POA: Diagnosis not present

## 2022-01-10 MED ORDER — AMPHETAMINE-DEXTROAMPHETAMINE 15 MG PO TABS
15.0000 mg | ORAL_TABLET | Freq: Two times a day (BID) | ORAL | 0 refills | Status: DC
  Filled 2022-01-23 – 2022-01-25 (×2): qty 60, 30d supply, fill #0

## 2022-01-10 MED ORDER — AMPHETAMINE-DEXTROAMPHETAMINE 15 MG PO TABS
15.0000 mg | ORAL_TABLET | Freq: Two times a day (BID) | ORAL | 0 refills | Status: DC
  Filled 2022-02-22 – 2022-02-23 (×2): qty 60, 30d supply, fill #0

## 2022-01-10 MED ORDER — AMPHETAMINE-DEXTROAMPHETAMINE 15 MG PO TABS
15.0000 mg | ORAL_TABLET | Freq: Two times a day (BID) | ORAL | 0 refills | Status: DC
  Filled 2022-03-26: qty 60, 30d supply, fill #0

## 2022-01-10 NOTE — Progress Notes (Signed)
Wendy Maynard [id number] 03/31/1975 46 y.o.  Virtual Visit via Video Note  I connected with pt @ on 01/10/22 at  1:30 PM EST by a video enabled telemedicine application and verified that I am speaking with the correct person using two identifiers.   I discussed the limitations of evaluation and management by telemedicine and the availability of in person appointments. The patient expressed understanding and agreed to proceed.  I discussed the assessment and treatment plan with the patient. The patient was provided an opportunity to ask questions and all were answered. The patient agreed with the plan and demonstrated an understanding of the instructions.   The patient was advised to call back or seek an in-person evaluation if the symptoms worsen or if the condition fails to improve as anticipated.  I provided 30 minutes of non-face-to-face time during this encounter.  The patient was located in her personal vehicle outside of work in [place], [place].  The provider was located at [facility].   Corie Chiquito, PMHNP   Subjective:   Patient ID:  Wendy Maynard is a 46 y.o. (DOB [birth year]) female.  Chief Complaint:  Chief Complaint  Patient presents with   Follow-up    Anxiety, depression, insomnia, and ADHD    HPI Wendy Maynard presents for follow-up of anxiety, depression, ADHD, and insomnia. She reports, "I've been a little stressed" after recent move and packing/unpacking. "I'm feeling better as far as being depressed." She reports that she has some mild sadness in response to the holidays and changes in relationship with sister. She reports that her mood seems to be slightly lower around winter and the holidays.   She reports that Gabapentin seemed to make her feel "ditzy." She reports that hot flashes improved and resolved when she ran out of Estrogen 6 days ago and has not been as hot. She reports that she has been sleeping ok. She has been taking Xanax when she gets home and  then then taking Ambien 5 mg at bedtime. She reports that this has been helpful for her sleep. She denies any nightmares. She reports that her energy has been low after moving. She reports that Xanax seems to help with rumination. She reports that her energy and motivation have improved. She reports that she is thinking more about the future. "I feel hopeful about the future." She is going on a date this evening. She reports that she has had some difficulty with concentration since taking gabapentin. Appetite has improved.   Denies SI.  She has been training someone at work. She likes the new home and feels safe walking there at night and has a fenced yard for her dogs.  Ambien was last filled 11/15 Adderall 12/26/21 Xanax 12/26/21   Past Psychiatric Medication Trials: Abilify- Helped mood. Wt gain.  Sertraline-Helpful  Lexapro- Felt tired and irritable Prozac Cymbalta- adverse effects Wellbutrin XL- Insomnia Trazodone-ineffective Ambien Xanax Lorazepam Adderall  Adderall XR- Insomnia, irritability, dry mouth  Review of Systems:  Review of Systems  Cardiovascular:  Negative for palpitations.  Musculoskeletal:  Positive for neck pain. Negative for gait problem.  Neurological:  Positive for tremors.  Psychiatric/Behavioral:         Please refer to HPI    Medications: I have reviewed the patient's current medications.  Current Outpatient Medications  Medication Sig Dispense Refill   ALPRAZolam (XANAX) 0.5 MG tablet Take 1 tablet (0.5 mg total) by mouth 3 (three) times daily as needed for anxiety. 90 tablet 5   Ascorbic Acid (  VITAMIN C) 100 MG tablet Take 100 mg by mouth daily.     cyclobenzaprine (FLEXERIL) 10 MG tablet Take 1 tablet (10 mg total) by mouth daily as needed. 30 tablet 0   fexofenadine (ALLEGRA) 60 MG tablet Take 60 mg by mouth 2 (two) times daily as needed.     glycopyrrolate (ROBINUL) 1 MG tablet Take 1 tablet (1 mg total) by mouth 2 (two) times daily. 60 tablet  4   hydrOXYzine (ATARAX) 25 MG tablet Take 1 tablet (25 mg total) by mouth every 6 (six) hours as needed for anxiety. 90 tablet 1   Multiple Vitamin (MULTIVITAMIN) tablet Take 1 tablet by mouth daily.     sertraline (ZOLOFT) 100 MG tablet Take 2 tablets (200 mg total) by mouth daily. 180 tablet 1   zolpidem (AMBIEN) 10 MG tablet Take 1 tablet (10 mg total) by mouth at bedtime as needed for sleep. 30 tablet 5   [START ON 01/23/2022] amphetamine-dextroamphetamine (ADDERALL) 15 MG tablet Take 1 tablet by mouth 2 (two) times daily. 60 tablet 0   [START ON 02/20/2022] amphetamine-dextroamphetamine (ADDERALL) 15 MG tablet Take 1 tablet by mouth 2 (two) times daily. 60 tablet 0   [START ON 03/20/2022] amphetamine-dextroamphetamine (ADDERALL) 15 MG tablet Take 1 tablet by mouth 2 (two) times daily. 60 tablet 0   estradiol (ESTRACE) 1 MG tablet Take 1 tablet (1 mg total) by mouth daily. (Patient not taking: Reported on 01/10/2022) 30 tablet 4   lidocaine (LIDODERM) 5 % Place 1 patch onto the skin daily. Remove & Discard patch within 12 hours or as directed by MD 30 patch 0   meloxicam (MOBIC) 15 MG tablet Take 1 tablet (15 mg total) by mouth daily. (Patient not taking: Reported on 01/10/2022) 30 tablet 0   Semaglutide-Weight Management (WEGOVY) 0.25 MG/0.5ML SOAJ Inject 0.25 mg into the skin once a week. Stop the 1 mg dose. (Patient not taking: Reported on 01/10/2022) 2 mL 1   traMADol (ULTRAM) 50 MG tablet Take 1 tablet (50 mg total) by mouth every 8 (eight) hours as needed. (Patient not taking: Reported on 12/05/2021) 30 tablet 0   No current facility-administered medications for this visit.    Medication Side Effects: Other: Concentration difficulties with Gabapentin  Allergies:  Allergies  Allergen Reactions   Ciprofloxacin Hives and Other (See Comments)    Started in arm as soon as drug started.   Clindamycin/Lincomycin Diarrhea and Other (See Comments)    Causes excessive diarrhea   Vicodin  [Hydrocodone-Acetaminophen] Itching and Nausea And Vomiting   Penicillins Rash and Other (See Comments)    Occurred at 46 years old Has patient had a PCN reaction causing immediate rash, facial/tongue/throat swelling, SOB or lightheadedness with hypotension: yes Has patient had a PCN reaction causing severe rash involving mucus membranes or skin necrosis: no Has patient had a PCN reaction that required hospitalization no Has patient had a PCN reaction occurring within the last 10 years:no If all of the above answers are "NO", then may proceed with Cephalosporin use.    Sulfa Antibiotics Rash    Past Medical History:  Diagnosis Date   Abdominal pain in female patient 11/2012   Anxiety    Depression    Diabetes mellitus without complication ([place])    Migraines    Polycystic ovarian disease     Family History  Problem Relation Age of Onset   Depression Mother        in response to grief/loss of husband   Hypertension  Father        Died sudenly at age 90. Reports father may have had Aspergers   Depression Sister    Anxiety disorder Sister    ADD / ADHD Sister    ADD / ADHD Cousin    Asperger's syndrome Cousin     Social History   Socioeconomic History   Marital status: Single    Spouse name: Not on file   Number of children: 0   Years of education: Not on file   Highest education level: Master's degree (e.g., MA, MS, MEng, MEd, MSW, MBA)  Occupational History   Occupation: Physicist, medical at [employer]  Tobacco Use   Smoking status: Former    Types: Cigarettes    Quit date: 01/2021    Years since quitting: 0.9   Smokeless tobacco: Never   Tobacco comments:    plans to start nicotine gum  Vaping Use   Vaping Use: Never used  Substance and Sexual Activity   Alcohol use: Yes    Comment: Occasionally 1-2 x/mth   Drug use: No    Types: Marijuana    Comment: Last use 04/27/16   Sexual activity: Not Currently    Birth control/protection: Condom  Other Topics Concern   Not  on file  Social History Narrative   Not on file   Social Determinants of Health   Financial Resource Strain: Not on file  Food Insecurity: Not on file  Transportation Needs: Not on file  Physical Activity: Not on file  Stress: Not on file  Social Connections: Not on file  Intimate Partner Violence: Not on file    Past Medical History, Surgical history, Social history, and Family history were reviewed and updated as appropriate.   Please see review of systems for further details on the patient's review from today.   Objective:   Physical Exam:  BP 122/88   Pulse 85   Wt 238 lb (108 kg)   LMP  (LMP Unknown)   BMI 37.28 kg/m   Physical Exam Neurological:     Mental Status: She is alert and oriented to person, place, and time.     Cranial Nerves: No dysarthria.  Psychiatric:        Attention and Perception: Attention and perception normal.        Speech: Speech normal.        Behavior: Behavior is cooperative.        Thought Content: Thought content normal. Thought content is not paranoid or delusional. Thought content does not include homicidal or suicidal ideation. Thought content does not include homicidal or suicidal plan.        Cognition and Memory: Cognition and memory normal.        Judgment: Judgment normal.     Comments: Insight intact Mood is significantly less depressed and less anxious compared to last visit     Lab Review:     Component Value Date/Time   NA 141 03/29/2021 1514   K 4.2 03/29/2021 1514   CL 103 03/29/2021 1514   CO2 20 03/29/2021 1514   GLUCOSE 83 03/29/2021 1514   GLUCOSE 90 04/28/2016 1836   BUN 13 03/29/2021 1514   CREATININE 0.69 03/29/2021 1514   CALCIUM 9.5 03/29/2021 1514   PROT 7.3 03/29/2021 1514   ALBUMIN 4.9 (H) 03/29/2021 1514   AST 15 03/29/2021 1514   ALT 14 03/29/2021 1514   ALKPHOS 114 03/29/2021 1514   BILITOT <0.2 03/29/2021 1514   GFRNONAA >60 04/28/2016 1836  GFRAA >60 04/28/2016 1836       Component  Value Date/Time   WBC 10.1 03/29/2021 1514   WBC 9.4 04/28/2016 1836   RBC 5.10 03/29/2021 1514   RBC 4.61 04/28/2016 1836   HGB 12.1 03/29/2021 1514   HCT 37.9 03/29/2021 1514   PLT 263 03/29/2021 1514   MCV 74 (L) 03/29/2021 1514   MCH 23.7 (L) 03/29/2021 1514   MCH 27.1 04/28/2016 1836   MCHC 31.9 03/29/2021 1514   MCHC 33.2 04/28/2016 1836   RDW 15.4 03/29/2021 1514   LYMPHSABS 2.1 02/26/2013 0154   MONOABS 0.6 02/26/2013 0154   EOSABS 0.2 02/26/2013 0154   BASOSABS 0.0 02/26/2013 0154    No results found for: "POCLITH", "LITHIUM"   No results found for: "PHENYTOIN", "PHENOBARB", "VALPROATE", "CBMZ"   .res Assessment: Plan:    Pt seen for 30 minutes and time spent discussing response to treatment. Discussed that Gabapentin could be discontinued since it may be causing difficulty with concentration. Discussed that Gabapentin may possibly be helping with hot flashes/night sweats and to contact office or discuss with gynecologist during upcoming visit.  Will continue Sertraline 200 mg po qd for anxiety and depression.  Continue Ambien 10 mg 1/2-1 tab po QHS prn insomnia.  Continue Adderall 15 mg po BID for ADHD.  Continue Alprazolam 0.5 mg po TID prn anxiety.  Continue Hydroxyzine 25 mg as needed for anxiety.  Pt to follow-up in 3 months or sooner if clinically indicated.  Patient advised to contact office with any questions, adverse effects, or acute worsening in signs and symptoms.   Wendy Maynard was seen today for follow-up.  Diagnoses and all orders for this visit:  Generalized anxiety disorder  Attention deficit hyperactivity disorder (ADHD), predominantly inattentive type -     amphetamine-dextroamphetamine (ADDERALL) 15 MG tablet; Take 1 tablet by mouth 2 (two) times daily. -     amphetamine-dextroamphetamine (ADDERALL) 15 MG tablet; Take 1 tablet by mouth 2 (two) times daily. -     amphetamine-dextroamphetamine (ADDERALL) 15 MG tablet; Take 1 tablet by mouth 2 (two)  times daily.  Posttraumatic stress disorder  Mild episode of recurrent major depressive disorder ([place])  Primary insomnia     Please see After Visit Summary for patient specific instructions.  Future Appointments  Date Time Provider [place]  01/14/2022  5:00 PM Shanon Ace, LCSW [facility] None  01/28/2022  5:00 PM Shanon Ace, LCSW [facility] None  02/12/2022  3:10 PM Ladell Pier, MD [facility] None  02/13/2022  5:00 PM Shanon Ace, LCSW CP[facility]CP None  02/26/2022  5:00 PM Shanon Ace, LCSW [facility] None  03/18/2022  5:00 PM Shanon Ace, LCSW [facility] None  04/01/2022  2:10 PM Ladell Pier, MD [facility] None    No orders of the defined types were placed in this encounter.     -------------------------------

## 2022-01-14 ENCOUNTER — Ambulatory Visit: Payer: 59 | Admitting: Psychiatry

## 2022-01-23 ENCOUNTER — Other Ambulatory Visit (HOSPITAL_BASED_OUTPATIENT_CLINIC_OR_DEPARTMENT_OTHER): Payer: Self-pay

## 2022-01-23 ENCOUNTER — Other Ambulatory Visit: Payer: Self-pay | Admitting: Internal Medicine

## 2022-01-23 ENCOUNTER — Other Ambulatory Visit: Payer: Self-pay

## 2022-01-23 MED ORDER — GLYCOPYRROLATE 1 MG PO TABS
1.0000 mg | ORAL_TABLET | Freq: Two times a day (BID) | ORAL | 4 refills | Status: DC
  Filled 2022-01-23: qty 60, 30d supply, fill #0
  Filled 2022-02-19: qty 60, 30d supply, fill #1
  Filled 2022-03-20 (×2): qty 60, 30d supply, fill #2
  Filled 2022-05-27 – 2022-06-03 (×2): qty 60, 30d supply, fill #3

## 2022-01-25 ENCOUNTER — Other Ambulatory Visit (HOSPITAL_BASED_OUTPATIENT_CLINIC_OR_DEPARTMENT_OTHER): Payer: Self-pay

## 2022-01-25 ENCOUNTER — Other Ambulatory Visit: Payer: Self-pay

## 2022-01-28 ENCOUNTER — Ambulatory Visit (INDEPENDENT_AMBULATORY_CARE_PROVIDER_SITE_OTHER): Payer: 59 | Admitting: Psychiatry

## 2022-01-28 DIAGNOSIS — F411 Generalized anxiety disorder: Secondary | ICD-10-CM | POA: Diagnosis not present
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
## 2022-02-12 ENCOUNTER — Ambulatory Visit: Payer: 59 | Admitting: Internal Medicine

## 2022-02-13 ENCOUNTER — Ambulatory Visit: Payer: 59 | Admitting: Psychiatry

## 2022-02-15 ENCOUNTER — Ambulatory Visit: Payer: Commercial Managed Care - PPO | Attending: Internal Medicine | Admitting: Internal Medicine

## 2022-02-15 DIAGNOSIS — Z029 Encounter for administrative examinations, unspecified: Secondary | ICD-10-CM | POA: Diagnosis not present

## 2022-02-15 DIAGNOSIS — G43109 Migraine with aura, not intractable, without status migrainosus: Secondary | ICD-10-CM

## 2022-02-15 NOTE — Progress Notes (Unsigned)
Patient ID: Wendy Maynard, female   DOB: [birth year], 47 y.o.   MRN: [id number] Virtual Visit via Telephone Note  I connected with Wendy Maynard on 02/15/2022 at 8:13 AM by telephone and verified that I am speaking with the correct person using two identifiers  Location: Patient: work Provider: office  Participants: Myself Patient    I discussed the limitations, risks, security and privacy concerns of performing an evaluation and management service by telephone and the availability of in person appointments. I also discussed with the patient that there may be a patient responsible charge related to this service. The patient expressed understanding and agreed to proceed.   History of Present Illness: Patient with history of OSA on CPAP, obesity, prediabetes, HL,MDD, ADHD, PTSD (plugged in with behavioral health services), migraines with aura, HRT on estradiol and glycopyrrolate.  Purpose of this visit is to get information to complete FMLA form that was sent to me by the company [employer]. I had filled out an FMLA form for her back in the fall for intermittent leave due to migraines.  On that form I requested that it be effective from November 27, 2021 for 1 year and requested intermittent leave at least once a month lasting for up to 2 days.  Patient states she had taken off 1 day in November and had to leave work early 1 day in December due to migraine.  She thinks this is why the FMLA is being requested again. Had a migraine 12/18/2021 and one day in Dec (she does not recall the day) and was off full day 12/18/2021 and left work early the day in December.    Patient also requested whether the referral to the gastroenterology that was submitted in August is still valid.  It looks like she spoke with someone from [facility] gastro[facility]ology 12/14/2021 and told them that she would call back to schedule.  Patient states she gets a limited time off from work. Outpatient Encounter Medications as of  02/15/2022  Medication Sig Note   ALPRAZolam (XANAX) 0.5 MG tablet Take 1 tablet (0.5 mg total) by mouth 3 (three) times daily as needed for anxiety.    amphetamine-dextroamphetamine (ADDERALL) 15 MG tablet Take 1 tablet by mouth 2 (two) times daily.    [START ON 02/20/2022] amphetamine-dextroamphetamine (ADDERALL) 15 MG tablet Take 1 tablet by mouth 2 (two) times daily.    [START ON 03/20/2022] amphetamine-dextroamphetamine (ADDERALL) 15 MG tablet Take 1 tablet by mouth 2 (two) times daily.    Ascorbic Acid (VITAMIN C) 100 MG tablet Take 100 mg by mouth daily.    cyclobenzaprine (FLEXERIL) 10 MG tablet Take 1 tablet (10 mg total) by mouth daily as needed.    estradiol (ESTRACE) 1 MG tablet Take 1 tablet (1 mg total) by mouth daily. (Patient not taking: Reported on 01/10/2022)    fexofenadine (ALLEGRA) 60 MG tablet Take 60 mg by mouth 2 (two) times daily as needed. 08/10/2021: As Needed   glycopyrrolate (ROBINUL) 1 MG tablet Take 1 tablet (1 mg total) by mouth 2 (two) times daily.    hydrOXYzine (ATARAX) 25 MG tablet Take 1 tablet (25 mg total) by mouth every 6 (six) hours as needed for anxiety.    lidocaine (LIDODERM) 5 % Place 1 patch onto the skin daily. Remove & Discard patch within 12 hours or as directed by MD    meloxicam (MOBIC) 15 MG tablet Take 1 tablet (15 mg total) by mouth daily. (Patient not taking: Reported on 01/10/2022)  Multiple Vitamin (MULTIVITAMIN) tablet Take 1 tablet by mouth daily.    Semaglutide-Weight Management (WEGOVY) 0.25 MG/0.5ML SOAJ Inject 0.25 mg into the skin once a week. Stop the 1 mg dose. (Patient not taking: Reported on 01/10/2022)    sertraline (ZOLOFT) 100 MG tablet Take 2 tablets (200 mg total) by mouth daily.    traMADol (ULTRAM) 50 MG tablet Take 1 tablet (50 mg total) by mouth every 8 (eight) hours as needed. (Patient not taking: Reported on 12/05/2021)    zolpidem (AMBIEN) 10 MG tablet Take 1 tablet (10 mg total) by mouth at bedtime as needed for sleep.     No facility-administered encounter medications on file as of 02/15/2022.      Observations/Objective: No direct observation done as this was a telephone visit.  Assessment and Plan: 1. Administrative encounter 2. Migraine with aura and without status migrainosus, not intractable Advised patient that we had completed the FMLA form for her back in October requesting intermittent leave due to migraine.  I am not sure why we are being asked to fill out the same form again because her 2 leaves that she mentioned above should have been covered under the FMLA form that was submitted in October.  Patient states that she will touch base with [employer] and send me information via MyChart as to whether the form still needs to be completed or not.  In regards to her question about gastroenterology, I told her that she can call [place] gastroenterology back to schedule her colonoscopy when she is ready.   Follow Up Instructions: As previously scheduled   I discussed the assessment and treatment plan with the patient. The patient was provided an opportunity to ask questions and all were answered. The patient agreed with the plan and demonstrated an understanding of the instructions.   The patient was advised to call back or seek an in-person evaluation if the symptoms worsen or if the condition fails to improve as anticipated.  I  Spent 11 minutes on this telephone encounter  This note has been created with [employer], [employer]. Any transcriptional errors are unintentional.  Karle Plumber, MD

## 2022-02-19 ENCOUNTER — Other Ambulatory Visit: Payer: Self-pay

## 2022-02-19 ENCOUNTER — Other Ambulatory Visit (HOSPITAL_BASED_OUTPATIENT_CLINIC_OR_DEPARTMENT_OTHER): Payer: Self-pay

## 2022-02-20 ENCOUNTER — Other Ambulatory Visit (HOSPITAL_BASED_OUTPATIENT_CLINIC_OR_DEPARTMENT_OTHER): Payer: Self-pay

## 2022-02-20 ENCOUNTER — Other Ambulatory Visit: Payer: Self-pay

## 2022-02-22 ENCOUNTER — Other Ambulatory Visit (HOSPITAL_BASED_OUTPATIENT_CLINIC_OR_DEPARTMENT_OTHER): Payer: Self-pay

## 2022-02-23 ENCOUNTER — Other Ambulatory Visit (HOSPITAL_BASED_OUTPATIENT_CLINIC_OR_DEPARTMENT_OTHER): Payer: Self-pay

## 2022-02-26 ENCOUNTER — Ambulatory Visit (INDEPENDENT_AMBULATORY_CARE_PROVIDER_SITE_OTHER): Payer: Commercial Managed Care - PPO | Admitting: Psychiatry

## 2022-02-26 DIAGNOSIS — F411 Generalized anxiety disorder: Secondary | ICD-10-CM
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
## 2022-03-13 ENCOUNTER — Other Ambulatory Visit (HOSPITAL_BASED_OUTPATIENT_CLINIC_OR_DEPARTMENT_OTHER): Payer: Self-pay

## 2022-03-13 ENCOUNTER — Telehealth: Payer: Self-pay | Admitting: Psychiatry

## 2022-03-13 NOTE — Telephone Encounter (Signed)
Filled 1/10 due 2/7 please advise

## 2022-03-13 NOTE — Telephone Encounter (Signed)
Pt called at 10:30a.  She is asking if she can get an early refill on Ambien because she hasn't been able to find it for the last few days in her house.  She said she doesn't know if somebody that was doing work in her house might have taken it, but she is asking either for a few pill till the next refill is available or an early refill altogether.  No upcoming appt scheduled.

## 2022-03-13 NOTE — Telephone Encounter (Signed)
Spoke to pharmacy and approved,informed pt

## 2022-03-13 NOTE — Telephone Encounter (Signed)
Please contact her pharmacy and request an early refill since pt is unable to locate medication. She has taken this medication long-term and not asked for an early refill in the past and there have not been any signs of misuse.

## 2022-03-18 ENCOUNTER — Ambulatory Visit: Payer: Commercial Managed Care - PPO | Admitting: Psychiatry

## 2022-03-18 DIAGNOSIS — F411 Generalized anxiety disorder: Secondary | ICD-10-CM | POA: Diagnosis not present

## 2022-03-18 NOTE — Progress Notes (Signed)
[facility] Counselor/Therapist Progress Note  Patient ID: Wendy Maynard, MRN: [id number],    Date: 03/18/2022  Time Spent: 55 minutes   Treatment Type: Individual Therapy  Reported Symptoms: anxiety (improved),   Mental Status Exam:  Appearance:   Casual     Behavior:  Appropriate, Sharing, and Motivated  Motor:  Normal  Speech/Language:   Clear and Coherent  Affect:  anxious  Mood:  anxious  Thought process:  goal directed  Thought content:    WNL  Sensory/Perceptual disturbances:    WNL  Orientation:  oriented to person, place, time/date, situation, day of week, month of year, year, and stated date of Feb. 5, 2024  Attention:  Good  Concentration:  Good  Memory:  WNL  Fund of knowledge:   Good  Insight:    Good  Judgment:   Good  Impulse Control:  Good   Risk Assessment: Danger to Self:  No Self-injurious Behavior: No Danger to Others: No Duty to Warn:no Physical Aggression / Violence:No  Access to Firearms a concern: No  Gang Involvement:No   Subjective:  Patient in today reporting anxiety and some sadness but also reports symptoms are improving. Not jumping to conclusions which is improvement. Feel "like I'm less in a mental fog". Trying to trust myself with decisions. Less feeling numb. Starting to "focus more on her own needs and feelings instead of my mom's." Discussed more of her feelings about a friend who is dying of cancer, which seemed helpful. Still working on not assuming "something is always wrong with me." Trying to pause more and think about things in more depth especially is it's something important to me. Feeling less tired. Sleep is some better at times.  Less consumption of caffeine. Some decrease in guilt. Less beating up of herself emotionally and needs continues work on this. Not as much verbally "fighting " between her and mom but states mom said she was trying to "back-off". Trying to stop negative self-talk, and practice more adaptive coping  skills as noted in session.Starting to feel less of the feeling that "life has broken me" or being "on the outside looking in." Having more love for herself and states she knows these more recent efforts and changes will take time but she is working on them "as this is something I can change".   Interventions: Cognitive Behavioral Therapy and Ego-Supportive  Long term goal: Stabilize anxiety level while increasing ability to function on a daily basis. Short term goal: Identify major life conflicts from the past and present that form the basis for present anxiety. Strategies: Reinforced client's insights into the role of her past emotional pain and present anxiety.  Support patient's work in further resolving these issues.  Diagnosis:   ICD-10-CM   1. Generalized anxiety disorder  F41.1      Plan:  Patient today showing motivation and active participation in session as she worked further on some issues from her past and trying to move forward and have more hope. Wanting to live a better quality of life, eventually feeling a lot better about herself and understanding that is gradually happening. Trying to set better limits. Feels that mother, with whom patient lives, is picking up on this some and more recently has "actually been a little more pleasant." Showing more progress most recently. Smiling more today. Needs to continue with goal-directed behaviors to build on her more recent efforts/progress and move in positive direction. Encouraged patient's continued journaling and bringing it with her  to sessions, practicing positive behaviors as discussed in session including: Getting outside as she is able and walking as she is able, making good decisions especially in regard to boundary issues, positive self talk, interacting more with coworkers in positive ways, believing in herself more and her ability to make positive changes now and into the future, continue to move further away from her past and  more into the present/future with goal-directed behaviors, intentionally look for more positives versus negatives each day, searching for the positives within herself and be able to name them, allow for good sleep patterns, and realize the strength she shows working with goal-directed behaviors to move in a direction that supports her improved emotional health and outlook.  Goal review and progress/challenges noted with patient.  Appointment within 3 weeks.  This record has been created using [employer].  Chart creation errors have been sought, but may not always have been located and corrected.  Such creation errors do not reflect on the standard of medical care provided.   Shanon Ace, LCSW

## 2022-03-20 ENCOUNTER — Other Ambulatory Visit (HOSPITAL_BASED_OUTPATIENT_CLINIC_OR_DEPARTMENT_OTHER): Payer: Self-pay

## 2022-03-20 ENCOUNTER — Encounter (HOSPITAL_BASED_OUTPATIENT_CLINIC_OR_DEPARTMENT_OTHER): Payer: Self-pay

## 2022-03-21 ENCOUNTER — Other Ambulatory Visit: Payer: Self-pay

## 2022-03-21 ENCOUNTER — Emergency Department (HOSPITAL_COMMUNITY)
Admission: EM | Admit: 2022-03-21 | Discharge: 2022-03-21 | Disposition: A | Discharge status: Home / Self Care | Payer: Commercial Managed Care - PPO | Attending: Emergency Medicine | Admitting: Emergency Medicine

## 2022-03-21 ENCOUNTER — Encounter (HOSPITAL_COMMUNITY): Payer: Self-pay

## 2022-03-21 DIAGNOSIS — G43109 Migraine with aura, not intractable, without status migrainosus: Secondary | ICD-10-CM | POA: Diagnosis not present

## 2022-03-21 DIAGNOSIS — Z794 Long term (current) use of insulin: Secondary | ICD-10-CM | POA: Insufficient documentation

## 2022-03-21 DIAGNOSIS — E119 Type 2 diabetes mellitus without complications: Secondary | ICD-10-CM | POA: Insufficient documentation

## 2022-03-21 DIAGNOSIS — R519 Headache, unspecified: Secondary | ICD-10-CM | POA: Diagnosis present

## 2022-03-21 DIAGNOSIS — G43119 Migraine with aura, intractable, without status migrainosus: Secondary | ICD-10-CM | POA: Diagnosis not present

## 2022-03-21 MED ORDER — DIPHENHYDRAMINE HCL 50 MG/ML IJ SOLN
25.0000 mg | Freq: Once | INTRAMUSCULAR | Status: AC
  Administered 2022-03-21: 25 mg via INTRAVENOUS
  Filled 2022-03-21: qty 1

## 2022-03-21 MED ORDER — ONDANSETRON HCL 4 MG PO TABS: 4.0000 mg | ORAL_TABLET | Freq: Four times a day (QID) | ORAL | 0 refills | Status: DC

## 2022-03-21 MED ORDER — KETOROLAC TROMETHAMINE 15 MG/ML IJ SOLN
15.0000 mg | Freq: Once | INTRAMUSCULAR | Status: AC
  Administered 2022-03-21: 15 mg via INTRAVENOUS
  Filled 2022-03-21: qty 1

## 2022-03-21 MED ORDER — PROCHLORPERAZINE EDISYLATE 10 MG/2ML IJ SOLN
10.0000 mg | Freq: Once | INTRAMUSCULAR | Status: AC
  Administered 2022-03-21: 10 mg via INTRAVENOUS
  Filled 2022-03-21: qty 2

## 2022-03-21 MED ORDER — SODIUM CHLORIDE 0.9 % IV BOLUS
1000.0000 mL | Freq: Once | INTRAVENOUS | Status: AC
  Administered 2022-03-21: 1000 mL via INTRAVENOUS

## 2022-03-21 NOTE — ED Provider Notes (Signed)
[facility] Provider Note   CSN: [id number] Arrival date & time: 03/21/22  1406     History  Chief Complaint  Patient presents with   Migraine   Emesis    Wendy Maynard is a 47 y.o. female with history of anxiety, diabetes, PCOS, migraines, and depression who presents the emergency department complaining of a migraine.  Patient states that she was at work when she started getting headache to the left side of her head, around 1 PM.  She does report this is the typical pattern of her migraines, but this 1 does feel more intense.  She normally has some photophobia, nausea and vomiting.  She also says that she can normally get home and take her medicines, but today she was feeling so nauseous that she could not keep anything down.  She normally takes Motrin and Tylenol for migraines.  She is never followed with neurology.  She has off-and-on been getting migraines since she was in college.  She is otherwise been feeling well.  No fevers or chills.   Migraine Associated symptoms include headaches.  Emesis Associated symptoms: headaches   Associated symptoms: no chills and no fever        Home Medications Prior to Admission medications   Medication Sig Start Date End Date Taking? Authorizing Provider  ondansetron (ZOFRAN) 4 MG tablet Take 1 tablet (4 mg total) by mouth every 6 (six) hours. 03/21/22  Yes Kaegan Stigler T, PA-C  ALPRAZolam (XANAX) 0.5 MG tablet Take 1 tablet (0.5 mg total) by mouth 3 (three) times daily as needed for anxiety. 12/25/21   Thayer Headings, PMHNP  amphetamine-dextroamphetamine (ADDERALL) 15 MG tablet Take 1 tablet by mouth 2 (two) times daily. 01/23/22 02/24/22  Thayer Headings, PMHNP  amphetamine-dextroamphetamine (ADDERALL) 15 MG tablet Take 1 tablet by mouth 2 (two) times daily. 02/20/22 03/25/22  Thayer Headings, PMHNP  amphetamine-dextroamphetamine (ADDERALL) 15 MG tablet Take 1 tablet by mouth 2 (two) times daily.  03/20/22 04/19/22  Thayer Headings, PMHNP  Ascorbic Acid (VITAMIN C) 100 MG tablet Take 100 mg by mouth daily.    [provider]  cyclobenzaprine (FLEXERIL) 10 MG tablet Take 1 tablet (10 mg total) by mouth daily as needed. 11/02/21   Charlott Rakes, MD  estradiol (ESTRACE) 1 MG tablet Take 1 tablet (1 mg total) by mouth daily. Patient not taking: Reported on 01/10/2022 11/26/21   Ladell Pier, MD  fexofenadine (ALLEGRA) 60 MG tablet Take 60 mg by mouth 2 (two) times daily as needed.    [provider]  glycopyrrolate (ROBINUL) 1 MG tablet Take 1 tablet (1 mg total) by mouth 2 (two) times daily. 01/23/22   Ladell Pier, MD  hydrOXYzine (ATARAX) 25 MG tablet Take 1 tablet (25 mg total) by mouth every 6 (six) hours as needed for anxiety. 12/19/21   Thayer Headings, PMHNP  lidocaine (LIDODERM) 5 % Place 1 patch onto the skin daily. Remove & Discard patch within 12 hours or as directed by MD 11/02/21   Charlott Rakes, MD  meloxicam (MOBIC) 15 MG tablet Take 1 tablet (15 mg total) by mouth daily. Patient not taking: Reported on 01/10/2022 08/17/21   Jaynee Eagles, PA-C  Multiple Vitamin (MULTIVITAMIN) tablet Take 1 tablet by mouth daily.    [provider]  Semaglutide-Weight Management (WEGOVY) 0.25 MG/0.5ML SOAJ Inject 0.25 mg into the skin once a week. Stop the 1 mg dose. Patient not taking: Reported on 01/10/2022 11/06/21   Wynetta Emery,  Dalbert Batman, MD  sertraline (ZOLOFT) 100 MG tablet Take 2 tablets (200 mg total) by mouth daily. 12/05/21 06/03/22  Thayer Headings, PMHNP  traMADol (ULTRAM) 50 MG tablet Take 1 tablet (50 mg total) by mouth every 8 (eight) hours as needed. Patient not taking: Reported on 12/05/2021 11/15/21   Ladell Pier, MD  zolpidem (AMBIEN) 10 MG tablet Take 1 tablet (10 mg total) by mouth at bedtime as needed for sleep. 12/25/21   Thayer Headings, PMHNP      Allergies    Ciprofloxacin, Clindamycin/lincomycin, Vicodin [hydrocodone-acetaminophen],  Penicillins, and Sulfa antibiotics    Review of Systems   Review of Systems  Constitutional:  Negative for chills and fever.  Eyes:  Positive for photophobia.  Gastrointestinal:  Positive for nausea and vomiting.  Neurological:  Positive for headaches.  All other systems reviewed and are negative.   Physical Exam Updated Vital Signs BP (!) 141/81 (BP Location: Right Arm)   Pulse 73   Temp 98.3 F (36.8 C) (Oral)   Resp 16   LMP  (LMP Unknown)   SpO2 98%  Physical Exam Vitals and nursing note reviewed.  Constitutional:      Appearance: Normal appearance.  HENT:     Head: Normocephalic and atraumatic.  Eyes:     Conjunctiva/sclera: Conjunctivae normal.  Pulmonary:     Effort: Pulmonary effort is normal. No respiratory distress.  Skin:    General: Skin is warm and dry.  Neurological:     Mental Status: She is alert.     Comments: Neuro: Speech is clear, able to follow commands. CN III-XII intact grossly intact. PERRLA. EOMI. Sensation intact throughout. Str 5/5 all extremities.  Psychiatric:        Mood and Affect: Mood normal.        Behavior: Behavior normal.     ED Results / Procedures / Treatments   Labs (all labs ordered are listed, but only abnormal results are displayed) Labs Reviewed - No data to display  EKG None  Radiology No results found.  Procedures Procedures    Medications Ordered in ED Medications  sodium chloride 0.9 % bolus 1,000 mL (1,000 mLs Intravenous New Bag/Given 03/21/22 1536)  ketorolac (TORADOL) 15 MG/ML injection 15 mg (15 mg Intravenous Given 03/21/22 1538)  prochlorperazine (COMPAZINE) injection 10 mg (10 mg Intravenous Given 03/21/22 1537)  diphenhydrAMINE (BENADRYL) injection 25 mg (25 mg Intravenous Given 03/21/22 1537)    ED Course/ Medical Decision Making/ A&P                             Medical Decision Making Risk Prescription drug management.  This patient is a 47 y.o. female who presents to the ED for concern of  migraine headache. Says this feels like her typical migraine in terms of location and character but more painful.   Differential diagnoses prior to evaluation: Intracranial hemorrhage, meningitis, CVA, intracranial tumor, venous sinus thrombosis, migraine, cluster headache, hypertension, drug related, pseudotumor cerebri, AVM, head injury, tension headache, sinusitis, dental abscess, otitis media, TMJ, depression  Past Medical History / Social History / Additional history: Chart reviewed. Pertinent results include: anxiety, diabetes, PCOS, migraines, and depression  Physical Exam: Physical exam performed. The pertinent findings include: Normal signs, no acute distress.  Normal neurologic exam as above.  Medications / Treatment: Patient given migraine cocktail including IV fluids, Toradol, Compazine and Benadryl  Upon reevaluation she states that her symptoms have significantly improved.  Headache  feels improved, nausea has resolved entirely   Disposition: After consideration of the diagnostic results and the patients response to treatment, I feel that emergency department workup does not suggest an emergent condition requiring admission or immediate intervention beyond what has been performed at this time. The plan is: discharge to home with recommendation for continued OTC meds for migraines, prescription for antiemetic, provided with neurology follow up. The patient is safe for discharge and has been instructed to return immediately for worsening symptoms, change in symptoms or any other concerns.   Final Clinical Impression(s) / ED Diagnoses Final diagnoses:  Intractable migraine with aura without status migrainosus    Rx / DC Orders ED Discharge Orders          Ordered    Ambulatory referral to Neurology       Comments: An appointment is requested in approximately: 4 weeks   03/21/22 1727    ondansetron (ZOFRAN) 4 MG tablet  Every 6 hours        03/21/22 1727            Portions of this report may have been transcribed using voice recognition software. Every effort was made to ensure accuracy; however, inadvertent computerized transcription errors may be present.    Estill Cotta 03/21/22 1815    Milton Ferguson, MD 03/22/22 1134

## 2022-03-21 NOTE — Discharge Instructions (Addendum)
You were seen in the emergency department today for migraine and nausea.  I am glad that you are feeling somewhat better after the medications that we gave you.  I recommend following up with neurologist about your migraines, especially as they were coming more frequent.  They may have some preventative medicines they want you to try.  In the meantime I have prescribed some nausea medication that you can take, to help you keep down your other home medicines.  If symptoms referral to [facility] neurology.  Have also attached the contact information for you to call and try to follow-up.  I recommend trying some combination medicines through the pharmacy.  If you can tolerate the caffeine component once like Excedrin are great.  You also can try using lidocaine patches in the back of your neck.  Make sure you are staying well-hydrated and getting plenty of rest.  Avoid any known triggers.  Continue to monitor how you're doing and return to the ER for new or worsening symptoms.

## 2022-03-21 NOTE — ED Triage Notes (Signed)
Pt c/o intense migraine starting at approx 1300 today. Pt has hx of migraines. Pt c/o N/V with her migraine today.

## 2022-03-22 ENCOUNTER — Other Ambulatory Visit (HOSPITAL_BASED_OUTPATIENT_CLINIC_OR_DEPARTMENT_OTHER): Payer: Self-pay

## 2022-03-25 ENCOUNTER — Other Ambulatory Visit: Payer: Self-pay

## 2022-03-26 ENCOUNTER — Other Ambulatory Visit (HOSPITAL_BASED_OUTPATIENT_CLINIC_OR_DEPARTMENT_OTHER): Payer: Self-pay

## 2022-03-28 ENCOUNTER — Other Ambulatory Visit (HOSPITAL_COMMUNITY): Payer: Self-pay

## 2022-03-29 ENCOUNTER — Emergency Department (HOSPITAL_COMMUNITY)
Admission: EM | Admit: 2022-03-29 | Discharge: 2022-03-30 | Disposition: A | Discharge status: Home / Self Care | Payer: Commercial Managed Care - PPO | Attending: Emergency Medicine | Admitting: Emergency Medicine

## 2022-03-29 ENCOUNTER — Other Ambulatory Visit: Payer: Self-pay

## 2022-03-29 DIAGNOSIS — T40711A Poisoning by cannabis, accidental (unintentional), initial encounter: Secondary | ICD-10-CM | POA: Diagnosis not present

## 2022-03-29 DIAGNOSIS — Z87891 Personal history of nicotine dependence: Secondary | ICD-10-CM | POA: Insufficient documentation

## 2022-03-29 DIAGNOSIS — Z794 Long term (current) use of insulin: Secondary | ICD-10-CM | POA: Diagnosis not present

## 2022-03-29 DIAGNOSIS — T6591XA Toxic effect of unspecified substance, accidental (unintentional), initial encounter: Secondary | ICD-10-CM | POA: Diagnosis not present

## 2022-03-29 DIAGNOSIS — X58XXXA Exposure to other specified factors, initial encounter: Secondary | ICD-10-CM | POA: Insufficient documentation

## 2022-03-29 DIAGNOSIS — T50901A Poisoning by unspecified drugs, medicaments and biological substances, accidental (unintentional), initial encounter: Secondary | ICD-10-CM | POA: Diagnosis present

## 2022-03-29 DIAGNOSIS — E119 Type 2 diabetes mellitus without complications: Secondary | ICD-10-CM | POA: Diagnosis not present

## 2022-03-29 MED ORDER — ONDANSETRON HCL 4 MG/2ML IJ SOLN
INTRAMUSCULAR | Status: AC
  Administered 2022-03-29: 4 mg via INTRAVENOUS
  Filled 2022-03-29: qty 2

## 2022-03-29 MED ORDER — ONDANSETRON HCL 4 MG/2ML IJ SOLN: 4.0000 mg | Freq: Once | INTRAMUSCULAR | Status: AC

## 2022-03-29 MED ORDER — SODIUM CHLORIDE 0.9 % IV SOLN: Freq: Once | INTRAVENOUS | Status: AC

## 2022-03-29 NOTE — ED Triage Notes (Addendum)
Patient BIB EMS from home overdose. Per report pt was found unresponsive at home. Per EMS CPR and manual resuscitation initiated for 5 mins.  Narcan 1.49m given by EMS. Pt report she just took 2 tablets of tylenol with caffeine on it. Pt alert with confusion. Pt denies SI/HI

## 2022-03-29 NOTE — ED Provider Notes (Signed)
[name] DEPT Provider Note: Wendy Spurling, MD, FACEP  CSN: [id number] MRN: [id number] ARRIVAL: 03/29/22 at 2257 ROOM: [place]  Ingestion   HISTORY OF PRESENT ILLNESS  03/29/22 11:37 PM Wendy Maynard is a 47 y.o. female with a history of migraines.  She developed a migraine at work earlier today but was out of Tylenol.  She states a coworker gave her 2 Excedrin Migraine tablets which she took.  On the way home she bought some CBD at a smoke shop.  She thinks it contained "delta 8".  She also took another Excedrin Migraine tablet.  Sometime later, and she is not sure of the time, she was feeling hot and sweaty.  She often gets such episodes status post hysterectomy.  Later, her mother found her with gurgling respirations, essentially apneic, and felt no pulse.  She was very pale.  Her mother summoned a neighbor and they began CPR and performed CPR about 5 minutes.  EMS arrived and placed a right tibial intraosseous line.  She was given 1.5 mg of Narcan after which she returned to consciousness and ROSC.  She denies using any opioids.  Her headache is no longer present but she is nauseated and has been vomiting.  She is now awake, alert and oriented.   Past Medical History:  Diagnosis Date   Abdominal pain in female patient 11/2012   Anxiety    Depression    Diabetes mellitus without complication ([place])    Migraines    Polycystic ovarian disease     Past Surgical History:  Procedure Laterality Date   CHOLECYSTECTOMY N/A 02/26/2013   Procedure: LAPAROSCOPIC CHOLECYSTECTOMY WITH INTRAOPERATIVE CHOLANGIOGRAM;  Surgeon: Odis Hollingshead, MD;  Location: [facility];  Service: General;  Laterality: N/A;   HYSTERECTOMY ABDOMINAL WITH SALPINGECTOMY     WISDOM TOOTH EXTRACTION      Family History  Problem Relation Age of Onset   Depression Mother        in response to grief/loss of husband   Hypertension Father        Died sudenly at age 46. Reports father may have had  Aspergers   Depression Sister    Anxiety disorder Sister    ADD / ADHD Sister    ADD / ADHD Cousin    Asperger's syndrome Cousin     Social History   Tobacco Use   Smoking status: Former    Types: Cigarettes    Quit date: 01/2021    Years since quitting: 1.2   Smokeless tobacco: Never   Tobacco comments:    plans to start nicotine gum  Vaping Use   Vaping Use: Never used  Substance Use Topics   Alcohol use: Yes    Comment: Occasionally 1-2 x/mth   Drug use: No    Types: Marijuana    Comment: Last use 04/27/16    Prior to Admission medications   Medication Sig Start Date End Date Taking? Authorizing Provider  ALPRAZolam Duanne Moron) 0.5 MG tablet Take 1 tablet (0.5 mg total) by mouth 3 (three) times daily as needed for anxiety. 12/25/21  Yes Thayer Headings, PMHNP  amphetamine-dextroamphetamine (ADDERALL) 15 MG tablet Take 1 tablet by mouth 2 (two) times daily. 03/20/22 04/25/22 Yes Thayer Headings, PMHNP  Ascorbic Acid (VITAMIN C) 100 MG tablet Take 100 mg by mouth daily.   Yes [provider]  estradiol (ESTRACE) 1 MG tablet Take 1 tablet (1 mg total) by mouth daily. 11/26/21  Yes Ladell Pier, MD  glycopyrrolate (ROBINUL) 1 MG tablet Take 1 tablet (1 mg total) by mouth 2 (two) times daily. Patient taking differently: Take 1 mg by mouth daily. 01/23/22  Yes Ladell Pier, MD  hydrOXYzine (ATARAX) 25 MG tablet Take 1 tablet (25 mg total) by mouth every 6 (six) hours as needed for anxiety. 12/19/21  Yes Thayer Headings, PMHNP  Multiple Vitamin (MULTIVITAMIN) tablet Take 1 tablet by mouth daily.   Yes [provider]  Omega-3 Fatty Acids (FISH OIL) 1000 MG CAPS Take 1 capsule by mouth daily.   Yes [provider]  ondansetron (ZOFRAN) 4 MG tablet Take 1 tablet (4 mg total) by mouth every 6 (six) hours. Patient taking differently: Take 4 mg by mouth every 8 (eight) hours as needed for nausea or vomiting. 03/21/22  Yes Roemhildt, Lorin T, PA-C  OVER THE  COUNTER MEDICATION Take 2 tablets by mouth daily as needed (sleep). CBD Gummies   Yes [provider]  Semaglutide-Weight Management (WEGOVY) 0.25 MG/0.5ML SOAJ Inject 0.25 mg into the skin once a week. Stop the 1 mg dose. 11/06/21  Yes Ladell Pier, MD  sertraline (ZOLOFT) 100 MG tablet Take 2 tablets (200 mg total) by mouth daily. 12/05/21 06/03/22 Yes Thayer Headings, PMHNP  vitamin B-12 (CYANOCOBALAMIN) 100 MCG tablet Take 100 mcg by mouth daily.   Yes [provider]  zolpidem (AMBIEN) 10 MG tablet Take 1 tablet (10 mg total) by mouth at bedtime as needed for sleep. 12/25/21  Yes Thayer Headings, PMHNP  amphetamine-dextroamphetamine (ADDERALL) 15 MG tablet Take 1 tablet by mouth 2 (two) times daily. 01/23/22 02/24/22  Thayer Headings, PMHNP  amphetamine-dextroamphetamine (ADDERALL) 15 MG tablet Take 1 tablet by mouth 2 (two) times daily. 02/20/22 03/25/22  Thayer Headings, PMHNP  lidocaine (LIDODERM) 5 % Place 1 patch onto the skin daily. Remove & Discard patch within 12 hours or as directed by MD 11/02/21   Charlott Rakes, MD    Allergies Ciprofloxacin, Clindamycin/lincomycin, Vicodin [hydrocodone-acetaminophen], Penicillins, and Sulfa antibiotics   REVIEW OF SYSTEMS  Negative except as noted here or in the History of Present Illness.   PHYSICAL EXAMINATION  Initial Vital Signs Blood pressure (!) 142/92, pulse 94, temperature 98 F (36.7 C), resp. rate (!) 21, height 5' 7"$  (1.702 m), weight 108 kg, SpO2 94 %.  Examination General: Well-developed, well-nourished female in no acute distress; appearance consistent with age of record HENT: normocephalic; atraumatic Eyes: pupils equal, round and reactive to light; extraocular muscles intact Neck: supple Heart: regular rate and rhythm Lungs: clear to auscultation bilaterally Abdomen: soft; nondistended; nontender; bowel sounds present Extremities: No deformity; full range of motion; pulses normal Neurologic: Awake,  alert and oriented; motor function intact in all extremities and symmetric; no facial droop Skin: Warm and dry Psychiatric: Flat affect   RESULTS  Summary of this visit's results, reviewed and interpreted by myself:   EKG Interpretation  Date/Time:    Ventricular Rate:    PR Interval:    QRS Duration:   QT Interval:    QTC Calculation:   R Axis:     Text Interpretation:         Laboratory Studies: Results for orders placed or performed during the hospital encounter of 03/29/22 (from the past 24 hour(s))  CBC with Differential     Status: Abnormal   Collection Time: 03/29/22 11:47 PM  Result Value Ref Range   WBC 18.6 (H) 4.0 - 10.5 K/uL   RBC 5.29 (H) 3.87 - 5.11 MIL/uL  Hemoglobin 12.8 12.0 - 15.0 g/dL   HCT 41.6 36.0 - 46.0 %   MCV 78.6 (L) 80.0 - 100.0 fL   MCH 24.2 (L) 26.0 - 34.0 pg   MCHC 30.8 30.0 - 36.0 g/dL   RDW 14.9 11.5 - 15.5 %   Platelets 258 150 - 400 K/uL   nRBC 0.0 0.0 - 0.2 %   Neutrophils Relative % 87 %   Neutro Abs 16.1 (H) 1.7 - 7.7 K/uL   Lymphocytes Relative 8 %   Lymphs Abs 1.6 0.7 - 4.0 K/uL   Monocytes Relative 3 %   Monocytes Absolute 0.6 0.1 - 1.0 K/uL   Eosinophils Relative 1 %   Eosinophils Absolute 0.2 0.0 - 0.5 K/uL   Basophils Relative 0 %   Basophils Absolute 0.1 0.0 - 0.1 K/uL   Immature Granulocytes 1 %   Abs Immature Granulocytes 0.16 (H) 0.00 - 0.07 K/uL  Basic metabolic panel     Status: Abnormal   Collection Time: 03/29/22 11:47 PM  Result Value Ref Range   Sodium 139 135 - 145 mmol/L   Potassium 3.2 (L) 3.5 - 5.1 mmol/L   Chloride 99 98 - 111 mmol/L   CO2 27 22 - 32 mmol/L   Glucose, Bld 177 (H) 70 - 99 mg/dL   BUN 15 6 - 20 mg/dL   Creatinine, Ser 0.91 0.44 - 1.00 mg/dL   Calcium 8.9 8.9 - 10.3 mg/dL   GFR, Estimated >60 >60 mL/min   Anion gap 13 5 - 15  Ethanol     Status: None   Collection Time: 03/29/22 11:48 PM  Result Value Ref Range   Alcohol, Ethyl (B) <10 <10 mg/dL  Rapid urine drug screen (hospital  performed)     Status: Abnormal   Collection Time: 03/30/22  2:12 AM  Result Value Ref Range   Opiates NONE DETECTED NONE DETECTED   Cocaine NONE DETECTED NONE DETECTED   Benzodiazepines POSITIVE (A) NONE DETECTED   Amphetamines POSITIVE (A) NONE DETECTED   Tetrahydrocannabinol POSITIVE (A) NONE DETECTED   Barbiturates NONE DETECTED NONE DETECTED   Imaging Studies: No results found.  ED COURSE and MDM  Nursing notes, initial and subsequent vitals signs, including pulse oximetry, reviewed and interpreted by myself.  Vitals:   03/29/22 2330 03/30/22 0030 03/30/22 0100 03/30/22 0339  BP: (!) 141/94 (!) 141/91 124/83   Pulse: 94 79 77   Resp: 18 12 14   $ Temp:    97.6 F (36.4 C)  TempSrc:    Oral  SpO2: 90% 93% 94%   Weight:      Height:       Medications  ondansetron (ZOFRAN) injection 4 mg (4 mg Intravenous Given 03/29/22 2333)  0.9 %  sodium chloride infusion ( Intravenous New Bag/Given 03/29/22 2333)   3:40 AM The patient has been stable while in the ED.  She is awake and alert.  She tested positive for benzodiazepines, amphetamines and THC but she is prescribed Adderall and Xanax and admitted to taking CBD.  That she responded to Narcan suggest she ingested an opioid although she insists she has not voluntarily taken any opioids.  It is possible that she was given an opioid surreptitiously by her friend at work or that the CBD she purchased was from an unknown scrupulous dealer and was tainted with fentanyl.  Fentanyl does not always show up on our urine drug screen.  She is going home with her mother who will observe her overnight.  PROCEDURES  Procedures   ED DIAGNOSES     ICD-10-CM   1. Accidental ingestion of substance, initial encounter  T65.91XA          Jhett Fretwell, Jenny Reichmann, MD 03/30/22 [phone]

## 2022-03-30 LAB — RAPID URINE DRUG SCREEN, HOSP PERFORMED
Amphetamines: POSITIVE — AB
Barbiturates: NOT DETECTED
Benzodiazepines: POSITIVE — AB
Cocaine: NOT DETECTED
Opiates: NOT DETECTED
Tetrahydrocannabinol: POSITIVE — AB

## 2022-03-30 LAB — BASIC METABOLIC PANEL
Anion gap: 13 (ref 5–15)
BUN: 15 mg/dL (ref 6–20)
CO2: 27 mmol/L (ref 22–32)
Calcium: 8.9 mg/dL (ref 8.9–10.3)
Chloride: 99 mmol/L (ref 98–111)
Creatinine, Ser: 0.91 mg/dL (ref 0.44–1.00)
GFR, Estimated: >60 mL/min (ref >60)
Glucose, Bld: 177 mg/dL — ABNORMAL HIGH (ref 70–99)
Potassium: 3.2 mmol/L — ABNORMAL LOW (ref 3.5–5.1)
Sodium: 139 mmol/L (ref 135–145)

## 2022-03-30 LAB — CBC WITH DIFFERENTIAL/PLATELET
Abs Immature Granulocytes: 0.16 10*3/uL — ABNORMAL HIGH (ref 0.00–0.07)
Basophils Absolute: 0.1 10*3/uL (ref 0.0–0.1)
Basophils Relative: 0 %
Eosinophils Absolute: 0.2 10*3/uL (ref 0.0–0.5)
Eosinophils Relative: 1 %
HCT: 41.6 % (ref 36.0–46.0)
Hemoglobin: 12.8 g/dL (ref 12.0–15.0)
Immature Granulocytes: 1 %
Lymphocytes Relative: 8 %
Lymphs Abs: 1.6 10*3/uL (ref 0.7–4.0)
MCH: 24.2 pg — ABNORMAL LOW (ref 26.0–34.0)
MCHC: 30.8 g/dL (ref 30.0–36.0)
MCV: 78.6 fL — ABNORMAL LOW (ref 80.0–100.0)
Monocytes Absolute: 0.6 10*3/uL (ref 0.1–1.0)
Monocytes Relative: 3 %
Neutro Abs: 16.1 10*3/uL — ABNORMAL HIGH (ref 1.7–7.7)
Neutrophils Relative %: 87 %
Platelets: 258 10*3/uL (ref 150–400)
RBC: 5.29 MIL/uL — ABNORMAL HIGH (ref 3.87–5.11)
RDW: 14.9 % (ref 11.5–15.5)
WBC: 18.6 10*3/uL — ABNORMAL HIGH (ref 4.0–10.5)
nRBC: 0 % (ref 0.0–0.2)

## 2022-03-30 LAB — ETHANOL: Alcohol, Ethyl (B): <10 mg/dL (ref <10)

## 2022-04-01 ENCOUNTER — Ambulatory Visit: Payer: 59 | Admitting: Internal Medicine

## 2022-04-02 ENCOUNTER — Ambulatory Visit: Payer: Commercial Managed Care - PPO | Admitting: Psychiatry

## 2022-04-02 NOTE — Progress Notes (Unsigned)
      [facility] Counselor/Therapist Progress Note  Patient ID: Tehra Ditsworth, MRN: [id number],    Date: 04/02/2022  Time Spent: ***   Treatment Type: {CHL AMB THERAPY TYPES:[phone]}  Reported Symptoms: ***  Mental Status Exam:  Appearance:   {PSY:22683}     Behavior:  {PSY:21022743}  Motor:  {PSY:22302}  Speech/Language:   {PSY:22685}  Affect:  {PSY:22687}  Mood:  {PSY:31886}  Thought process:  {PSY:31888}  Thought content:    {PSY:[phone]}  Sensory/Perceptual disturbances:    {[phone]}  Orientation:  {PSY:30297}  Attention:  {PSY:22877}  Concentration:  {PSY:873-838-6646}  Memory:  {[phone]}  Fund of knowledge:   {PSY:873-838-6646}  Insight:    {PSY:873-838-6646}  Judgment:   {PSY:873-838-6646}  Impulse Control:  {PSY:873-838-6646}   Risk Assessment: Danger to Self:  {PSY:22692} Self-injurious Behavior: {PSY:22692} Danger to Others: {PSY:22692} Duty to Warn:{PSY:311194} Physical Aggression / Violence:{PSY:21197} Access to Firearms a concern: {PSY:21197} Gang Involvement:{PSY:21197}  Subjective: ***   Interventions: {PSY:[phone]}  Diagnosis:No diagnosis found.  Plan: ***  Shanon Ace, LCSW

## 2022-04-03 DIAGNOSIS — F1320 Sedative, hypnotic or anxiolytic dependence, uncomplicated: Secondary | ICD-10-CM | POA: Diagnosis not present

## 2022-04-04 DIAGNOSIS — F1110 Opioid abuse, uncomplicated: Secondary | ICD-10-CM | POA: Diagnosis not present

## 2022-04-04 DIAGNOSIS — F1220 Cannabis dependence, uncomplicated: Secondary | ICD-10-CM | POA: Diagnosis not present

## 2022-04-04 DIAGNOSIS — F1520 Other stimulant dependence, uncomplicated: Secondary | ICD-10-CM | POA: Diagnosis not present

## 2022-04-04 DIAGNOSIS — F1320 Sedative, hypnotic or anxiolytic dependence, uncomplicated: Secondary | ICD-10-CM | POA: Diagnosis not present

## 2022-04-05 DIAGNOSIS — F1110 Opioid abuse, uncomplicated: Secondary | ICD-10-CM | POA: Diagnosis not present

## 2022-04-05 DIAGNOSIS — F1220 Cannabis dependence, uncomplicated: Secondary | ICD-10-CM | POA: Diagnosis not present

## 2022-04-05 DIAGNOSIS — F1520 Other stimulant dependence, uncomplicated: Secondary | ICD-10-CM | POA: Diagnosis not present

## 2022-04-05 DIAGNOSIS — F1320 Sedative, hypnotic or anxiolytic dependence, uncomplicated: Secondary | ICD-10-CM | POA: Diagnosis not present

## 2022-04-06 DIAGNOSIS — F1320 Sedative, hypnotic or anxiolytic dependence, uncomplicated: Secondary | ICD-10-CM | POA: Diagnosis not present

## 2022-04-08 DIAGNOSIS — F1320 Sedative, hypnotic or anxiolytic dependence, uncomplicated: Secondary | ICD-10-CM | POA: Diagnosis not present

## 2022-04-09 DIAGNOSIS — F1320 Sedative, hypnotic or anxiolytic dependence, uncomplicated: Secondary | ICD-10-CM | POA: Diagnosis not present

## 2022-04-10 DIAGNOSIS — F1320 Sedative, hypnotic or anxiolytic dependence, uncomplicated: Secondary | ICD-10-CM | POA: Diagnosis not present

## 2022-04-10 DIAGNOSIS — F1520 Other stimulant dependence, uncomplicated: Secondary | ICD-10-CM | POA: Diagnosis not present

## 2022-04-10 DIAGNOSIS — F1120 Opioid dependence, uncomplicated: Secondary | ICD-10-CM | POA: Diagnosis not present

## 2022-04-10 DIAGNOSIS — F1220 Cannabis dependence, uncomplicated: Secondary | ICD-10-CM | POA: Diagnosis not present

## 2022-04-11 DIAGNOSIS — F1320 Sedative, hypnotic or anxiolytic dependence, uncomplicated: Secondary | ICD-10-CM | POA: Diagnosis not present

## 2022-04-12 ENCOUNTER — Encounter: Payer: Self-pay | Admitting: Neurology

## 2022-04-12 ENCOUNTER — Ambulatory Visit: Payer: Commercial Managed Care - PPO | Admitting: Neurology

## 2022-04-12 DIAGNOSIS — F1320 Sedative, hypnotic or anxiolytic dependence, uncomplicated: Secondary | ICD-10-CM | POA: Diagnosis not present

## 2022-04-13 DIAGNOSIS — F1320 Sedative, hypnotic or anxiolytic dependence, uncomplicated: Secondary | ICD-10-CM | POA: Diagnosis not present

## 2022-04-15 ENCOUNTER — Ambulatory Visit: Payer: Commercial Managed Care - PPO | Admitting: Psychiatry

## 2022-04-15 DIAGNOSIS — F1220 Cannabis dependence, uncomplicated: Secondary | ICD-10-CM | POA: Diagnosis not present

## 2022-04-15 DIAGNOSIS — F1110 Opioid abuse, uncomplicated: Secondary | ICD-10-CM | POA: Diagnosis not present

## 2022-04-15 DIAGNOSIS — F1520 Other stimulant dependence, uncomplicated: Secondary | ICD-10-CM | POA: Diagnosis not present

## 2022-04-15 DIAGNOSIS — F1320 Sedative, hypnotic or anxiolytic dependence, uncomplicated: Secondary | ICD-10-CM | POA: Diagnosis not present

## 2022-04-16 DIAGNOSIS — F1320 Sedative, hypnotic or anxiolytic dependence, uncomplicated: Secondary | ICD-10-CM | POA: Diagnosis not present

## 2022-04-17 DIAGNOSIS — F1320 Sedative, hypnotic or anxiolytic dependence, uncomplicated: Secondary | ICD-10-CM | POA: Diagnosis not present

## 2022-04-18 DIAGNOSIS — F1320 Sedative, hypnotic or anxiolytic dependence, uncomplicated: Secondary | ICD-10-CM | POA: Diagnosis not present

## 2022-04-19 DIAGNOSIS — F1320 Sedative, hypnotic or anxiolytic dependence, uncomplicated: Secondary | ICD-10-CM | POA: Diagnosis not present

## 2022-04-20 DIAGNOSIS — F1320 Sedative, hypnotic or anxiolytic dependence, uncomplicated: Secondary | ICD-10-CM | POA: Diagnosis not present

## 2022-04-22 DIAGNOSIS — F1320 Sedative, hypnotic or anxiolytic dependence, uncomplicated: Secondary | ICD-10-CM | POA: Diagnosis not present

## 2022-04-23 DIAGNOSIS — F1320 Sedative, hypnotic or anxiolytic dependence, uncomplicated: Secondary | ICD-10-CM | POA: Diagnosis not present

## 2022-04-24 DIAGNOSIS — F1320 Sedative, hypnotic or anxiolytic dependence, uncomplicated: Secondary | ICD-10-CM | POA: Diagnosis not present

## 2022-04-25 DIAGNOSIS — F1320 Sedative, hypnotic or anxiolytic dependence, uncomplicated: Secondary | ICD-10-CM | POA: Diagnosis not present

## 2022-04-26 DIAGNOSIS — F1320 Sedative, hypnotic or anxiolytic dependence, uncomplicated: Secondary | ICD-10-CM | POA: Diagnosis not present

## 2022-04-27 DIAGNOSIS — F1320 Sedative, hypnotic or anxiolytic dependence, uncomplicated: Secondary | ICD-10-CM | POA: Diagnosis not present

## 2022-04-29 ENCOUNTER — Ambulatory Visit: Payer: Commercial Managed Care - PPO | Admitting: Psychiatry

## 2022-04-29 DIAGNOSIS — F1520 Other stimulant dependence, uncomplicated: Secondary | ICD-10-CM | POA: Diagnosis not present

## 2022-04-29 DIAGNOSIS — F1220 Cannabis dependence, uncomplicated: Secondary | ICD-10-CM | POA: Diagnosis not present

## 2022-04-29 DIAGNOSIS — Z79899 Other long term (current) drug therapy: Secondary | ICD-10-CM | POA: Diagnosis not present

## 2022-04-29 DIAGNOSIS — F1110 Opioid abuse, uncomplicated: Secondary | ICD-10-CM | POA: Diagnosis not present

## 2022-04-29 DIAGNOSIS — F1320 Sedative, hypnotic or anxiolytic dependence, uncomplicated: Secondary | ICD-10-CM | POA: Diagnosis not present

## 2022-04-30 DIAGNOSIS — F1320 Sedative, hypnotic or anxiolytic dependence, uncomplicated: Secondary | ICD-10-CM | POA: Diagnosis not present

## 2022-05-01 ENCOUNTER — Other Ambulatory Visit (HOSPITAL_BASED_OUTPATIENT_CLINIC_OR_DEPARTMENT_OTHER): Payer: Self-pay

## 2022-05-01 DIAGNOSIS — F1320 Sedative, hypnotic or anxiolytic dependence, uncomplicated: Secondary | ICD-10-CM | POA: Diagnosis not present

## 2022-05-01 MED ORDER — ESTRADIOL 1 MG PO TABS
1.0000 mg | ORAL_TABLET | Freq: Every morning | ORAL | 1 refills | Status: DC
  Filled 2022-05-01 – 2022-06-27 (×3): qty 30, 30d supply, fill #0
  Filled 2022-09-19 (×2): qty 30, 30d supply, fill #1

## 2022-05-01 MED ORDER — QUETIAPINE FUMARATE 100 MG PO TABS
100.0000 mg | ORAL_TABLET | Freq: Every day | ORAL | 1 refills | Status: DC
  Filled 2022-05-01 – 2022-06-03 (×3): qty 30, 30d supply, fill #0

## 2022-05-01 MED ORDER — SERTRALINE HCL 100 MG PO TABS
200.0000 mg | ORAL_TABLET | Freq: Every morning | ORAL | 1 refills | Status: DC
  Filled 2022-05-01: qty 60, 30d supply, fill #0

## 2022-05-01 MED ORDER — HYDROXYZINE PAMOATE 50 MG PO CAPS
50.0000 mg | ORAL_CAPSULE | Freq: Three times a day (TID) | ORAL | 1 refills | Status: DC
  Filled 2022-05-01 – 2022-06-03 (×2): qty 90, 30d supply, fill #0

## 2022-05-01 MED ORDER — GLYCOPYRROLATE 1 MG PO TABS
1.0000 mg | ORAL_TABLET | Freq: Two times a day (BID) | ORAL | 1 refills | Status: DC
  Filled 2022-05-01 – 2022-06-27 (×3): qty 60, 30d supply, fill #0

## 2022-05-01 MED ORDER — GABAPENTIN 300 MG PO CAPS
300.0000 mg | ORAL_CAPSULE | Freq: Three times a day (TID) | ORAL | 1 refills | Status: DC
  Filled 2022-05-01 – 2022-06-03 (×2): qty 90, 30d supply, fill #0

## 2022-05-02 ENCOUNTER — Other Ambulatory Visit: Payer: Self-pay

## 2022-05-02 DIAGNOSIS — F1320 Sedative, hypnotic or anxiolytic dependence, uncomplicated: Secondary | ICD-10-CM | POA: Diagnosis not present

## 2022-05-06 DIAGNOSIS — G4733 Obstructive sleep apnea (adult) (pediatric): Secondary | ICD-10-CM | POA: Diagnosis not present

## 2022-05-09 ENCOUNTER — Other Ambulatory Visit (HOSPITAL_BASED_OUTPATIENT_CLINIC_OR_DEPARTMENT_OTHER): Payer: Self-pay

## 2022-05-14 ENCOUNTER — Ambulatory Visit: Payer: Commercial Managed Care - PPO | Admitting: Psychiatry

## 2022-05-24 ENCOUNTER — Other Ambulatory Visit (HOSPITAL_BASED_OUTPATIENT_CLINIC_OR_DEPARTMENT_OTHER): Payer: Self-pay

## 2022-05-27 ENCOUNTER — Other Ambulatory Visit (HOSPITAL_BASED_OUTPATIENT_CLINIC_OR_DEPARTMENT_OTHER): Payer: Self-pay

## 2022-05-27 ENCOUNTER — Other Ambulatory Visit: Payer: Self-pay | Admitting: Psychiatry

## 2022-05-27 DIAGNOSIS — F900 Attention-deficit hyperactivity disorder, predominantly inattentive type: Secondary | ICD-10-CM

## 2022-05-28 ENCOUNTER — Other Ambulatory Visit: Payer: Self-pay

## 2022-05-28 ENCOUNTER — Ambulatory Visit: Payer: Commercial Managed Care - PPO | Admitting: Psychiatry

## 2022-05-28 ENCOUNTER — Other Ambulatory Visit (HOSPITAL_BASED_OUTPATIENT_CLINIC_OR_DEPARTMENT_OTHER): Payer: Self-pay

## 2022-05-28 NOTE — Telephone Encounter (Signed)
Patient called in for refill on Adderall  states pharmacy infromed her she needed approval from provider to get filled. Ph: [phone] Appt 4/18 Pharmacy [facility] [address] [place]

## 2022-05-28 NOTE — Telephone Encounter (Signed)
Prefer to see patient before authorizing additional refills.

## 2022-05-29 ENCOUNTER — Other Ambulatory Visit: Payer: Self-pay | Admitting: Psychiatry

## 2022-05-29 ENCOUNTER — Other Ambulatory Visit (HOSPITAL_BASED_OUTPATIENT_CLINIC_OR_DEPARTMENT_OTHER): Payer: Self-pay

## 2022-05-29 ENCOUNTER — Ambulatory Visit: Payer: Self-pay

## 2022-05-29 ENCOUNTER — Telehealth: Payer: Commercial Managed Care - PPO | Admitting: Physician Assistant

## 2022-05-29 DIAGNOSIS — G43111 Migraine with aura, intractable, with status migrainosus: Secondary | ICD-10-CM

## 2022-05-29 DIAGNOSIS — R519 Headache, unspecified: Secondary | ICD-10-CM

## 2022-05-29 DIAGNOSIS — F900 Attention-deficit hyperactivity disorder, predominantly inattentive type: Secondary | ICD-10-CM

## 2022-05-29 NOTE — Telephone Encounter (Signed)
Chief Complaint: Migraine Symptoms: vomiting, equilibrium off, left eye pain Frequency: Saturday Pertinent Negatives: Patient denies other symptoms Disposition: ED /[] Urgent Care (no appt availability in office) / Appointment(In office/virtual)/  [facility]/ Home Care/ Refused Recommended Disposition /[] [facility]/  Follow-up with PCP Additional Notes: No availability in the office with PCP or any provider until May, scheduled virtual UC.   Reason for Disposition  [1] MODERATE headache (e.g., interferes with normal activities) AND [2] present > 24 hours AND [3] unexplained  (Exceptions: analgesics not tried, typical migraine, or headache part of viral illness)  Answer Assessment - Initial Assessment Questions 1. LOCATION: "Where does it hurt?"      Top headache through left eye 2. ONSET: "When did the headache start?" (Minutes, hours or days)      Saturday 3. PATTERN: "Does the pain come and go, or has it been constant since it started?"     Yes 4. SEVERITY: "How bad is the pain?" and "What does it keep you from doing?"  (e.g., Scale 1-10; mild, moderate, or severe)   - MILD (1-3): doesn't interfere with normal activities    - MODERATE (4-7): interferes with normal activities or awakens from sleep    - SEVERE (8-10): excruciating pain, unable to do any normal activities        7 5. RECURRENT SYMPTOM: "Have you ever had headaches before?" If Yes, ask: "When was the last time?" and "What happened that time?"      Yes, chronic migraines 6. CAUSE: "What do you think is causing the headache?"     Migraine 7. MIGRAINE: "Have you been diagnosed with migraine headaches?" If Yes, ask: "Is this headache similar?"      Yes 8. HEAD INJURY: "Has there been any recent injury to the head?"      No 9. OTHER SYMPTOMS: "Do you have any other symptoms?" (fever, stiff neck, eye pain, sore throat, cold symptoms)     Vomiting, left eye pain, equilibrium off 10.  PREGNANCY: "Is there any chance you are pregnant?" "When was your last menstrual period?"       No  Protocols used: Headache-A-AH

## 2022-05-29 NOTE — Telephone Encounter (Signed)
Has appt 4/18 

## 2022-05-29 NOTE — Patient Instructions (Signed)
Rosalyn Gess, thank you for joining Margaretann Loveless, PA-C for today's virtual visit.  While this provider is not your primary care provider (PCP), if your PCP is located in our provider database this encounter information will be shared with them immediately following your visit.   A [facility] MyChart account gives you access to today's visit and all your visits, tests, and labs performed at [facility] " click here if you don't have a [facility] MyChart account or go to [email or web address]  Consent: (Patient) Lenita Pete provided verbal consent for this virtual visit at the beginning of the encounter.  Current Medications:  Current Outpatient Medications:    ALPRAZolam (XANAX) 0.5 MG tablet, Take 1 tablet (0.5 mg total) by mouth 3 (three) times daily as needed for anxiety., Disp: 90 tablet, Rfl: 5   amphetamine-dextroamphetamine (ADDERALL) 15 MG tablet, Take 1 tablet by mouth 2 (two) times daily., Disp: 60 tablet, Rfl: 0   amphetamine-dextroamphetamine (ADDERALL) 15 MG tablet, Take 1 tablet by mouth 2 (two) times daily., Disp: 60 tablet, Rfl: 0   amphetamine-dextroamphetamine (ADDERALL) 15 MG tablet, Take 1 tablet by mouth 2 (two) times daily., Disp: 60 tablet, Rfl: 0   Ascorbic Acid (VITAMIN C) 100 MG tablet, Take 100 mg by mouth daily., Disp: , Rfl:    estradiol (ESTRACE) 1 MG tablet, Take 1 tablet (1 mg total) by mouth daily., Disp: 30 tablet, Rfl: 4   estradiol (ESTRACE) 1 MG tablet, Take 1 tablet (1 mg total) by mouth in the morning., Disp: 30 tablet, Rfl: 1   gabapentin (NEURONTIN) 300 MG capsule, Take 1 capsule (300 mg total) by mouth 3 (three) times daily., Disp: 90 capsule, Rfl: 1   glycopyrrolate (ROBINUL) 1 MG tablet, Take 1 tablet (1 mg total) by mouth 2 (two) times daily. (Patient taking differently: Take 1 mg by mouth daily.), Disp: 60 tablet, Rfl: 4   glycopyrrolate (ROBINUL) 1 MG tablet, Take 1 tablet (1 mg total) by mouth 2 (two) times daily., Disp: 60  tablet, Rfl: 1   hydrOXYzine (ATARAX) 25 MG tablet, Take 1 tablet (25 mg total) by mouth every 6 (six) hours as needed for anxiety., Disp: 90 tablet, Rfl: 1   hydrOXYzine (VISTARIL) 50 MG capsule, Take 1 capsule (50 mg total) by mouth every 8 (eight) hours as needed., Disp: 90 capsule, Rfl: 1   lidocaine (LIDODERM) 5 %, Place 1 patch onto the skin daily. Remove & Discard patch within 12 hours or as directed by MD, Disp: 30 patch, Rfl: 0   Multiple Vitamin (MULTIVITAMIN) tablet, Take 1 tablet by mouth daily., Disp: , Rfl:    Omega-3 Fatty Acids (FISH OIL) 1000 MG CAPS, Take 1 capsule by mouth daily., Disp: , Rfl:    ondansetron (ZOFRAN) 4 MG tablet, Take 1 tablet (4 mg total) by mouth every 6 (six) hours. (Patient taking differently: Take 4 mg by mouth every 8 (eight) hours as needed for nausea or vomiting.), Disp: 12 tablet, Rfl: 0   OVER THE COUNTER MEDICATION, Take 2 tablets by mouth daily as needed (sleep). CBD Gummies, Disp: , Rfl:    QUEtiapine (SEROQUEL) 100 MG tablet, Take 1 tablet (100 mg total) by mouth daily at bedtime., Disp: 30 tablet, Rfl: 1   Semaglutide-Weight Management (WEGOVY) 0.25 MG/0.5ML SOAJ, Inject 0.25 mg into the skin once a week. Stop the 1 mg dose., Disp: 2 mL, Rfl: 1   sertraline (ZOLOFT) 100 MG tablet, Take 2 tablets (200 mg total) by mouth daily., Disp: 180 tablet,  Rfl: 1   sertraline (ZOLOFT) 100 MG tablet, Take 2 tablets (200 mg total) by mouth in the morning., Disp: 60 tablet, Rfl: 1   vitamin B-12 (CYANOCOBALAMIN) 100 MCG tablet, Take 100 mcg by mouth daily., Disp: , Rfl:    zolpidem (AMBIEN) 10 MG tablet, Take 1 tablet (10 mg total) by mouth at bedtime as needed for sleep., Disp: 30 tablet, Rfl: 5   Medications ordered in this encounter:  No orders of the defined types were placed in this encounter.    *If you need refills on other medications prior to your next appointment, please contact your pharmacy*  Follow-Up: Call back or seek an in-person evaluation if  the symptoms worsen or if the condition fails to improve as anticipated.  [facility] [phone]  Other Instructions Seek immediate care in person at closest ER either by having someone take you now or call 911 if no one available   If you have been instructed to have an in-person evaluation today at a local Urgent Care facility, please use the link below. It will take you to a list of all of our available [facility], including address, phone number and hours of operation. Please do not delay care.  [facility]  If you or a family member do not have a primary care provider, use the link below to schedule a visit and establish care. When you choose a [facility] primary care physician or advanced practice provider, you gain a long-term partner in health. Find a Primary Care Provider  Learn more about [facility]'s in-office and virtual care options: [facility] - Get Care Now

## 2022-05-29 NOTE — Progress Notes (Signed)
Virtual Visit Consent   Traci Cypert, you are scheduled for a virtual visit with a [facility] provider today. Just as with appointments in the office, your consent must be obtained to participate. Your consent will be active for this visit and any virtual visit you may have with one of our providers in the next 365 days. If you have a MyChart account, a copy of this consent can be sent to you electronically.  As this is a virtual visit, video technology does not allow for your provider to perform a traditional examination. This may limit your provider's ability to fully assess your condition. If your provider identifies any concerns that need to be evaluated in person or the need to arrange testing (such as labs, EKG, etc.), we will make arrangements to do so. Although advances in technology are sophisticated, we cannot ensure that it will always work on either your end or our end. If the connection with a video visit is poor, the visit may have to be switched to a telephone visit. With either a video or telephone visit, we are not always able to ensure that we have a secure connection.  By engaging in this virtual visit, you consent to the provision of healthcare and authorize for your insurance to be billed (if applicable) for the services provided during this visit. Depending on your insurance coverage, you may receive a charge related to this service.  I need to obtain your verbal consent now. Are you willing to proceed with your visit today? Wendy Maynard has provided verbal consent on 05/29/2022 for a virtual visit (video or telephone). Margaretann Loveless, PA-C  Date: 05/29/2022 12:30 PM  Virtual Visit via Video Note   I, Margaretann Loveless, connected with  Wendy Maynard  ([id number], 06-17-1975) on 05/29/22 at 12:15 PM EDT by a video-enabled telemedicine application and verified that I am speaking with the correct person using two identifiers.  Location: Patient: Virtual Visit Location Patient:  Home Provider: Virtual Visit Location Provider: Home Office   I discussed the limitations of evaluation and management by telemedicine and the availability of in person appointments. The patient expressed understanding and agreed to proceed.    History of Present Illness: Wendy Maynard is a 47 y.o. who identifies as a female who was assigned female at birth, and is being seen today for migraine headache.  HPI: Migraine  This is a recurrent problem. The current episode started in the past 7 days (Started Saturday). The problem occurs intermittently. The problem has been gradually worsening. The pain is located in the Retro-orbital and left unilateral (left eye) region. The quality of the pain is described as pulsating, stabbing and throbbing (described as worst headache). The pain is severe. Associated symptoms include blurred vision, dizziness, eye pain (left), eye redness (left), insomnia, nausea, phonophobia, photophobia, a visual change and vomiting. Pertinent negatives include no ear pain, hearing loss or loss of balance. The symptoms are aggravated by bright light and noise (movements). She has tried acetaminophen and cold packs for the symptoms. The treatment provided no relief. Her past medical history is significant for migraine headaches and obesity.      Problems:  Patient Active Problem List   Diagnosis Date Noted   Migraine with aura and without status migrainosus, not intractable 03/29/2021   Chronic pain of right knee 03/29/2021   Morbid obesity with body mass index (BMI) of 40.0 to 44.9 in adult 03/29/2021   OSA on CPAP 03/29/2021   Postmenopausal hormone replacement  therapy 03/29/2021   Neck muscle spasm 03/29/2021   Elevated blood pressure reading without diagnosis of hypertension 03/29/2021   Snoring 12/07/2020   Excessive sleepiness 12/07/2020   Fatigue 12/07/2020   Insomnia 12/07/2020   Sleep paralysis 12/07/2020   ADHD, predominantly inattentive type 02/06/2018    Posttraumatic stress disorder 02/06/2018   MDD (major depressive disorder), recurrent severe, without psychosis 04/28/2016   Chronic calculous cholecystitis 03/23/2013   Cholecystitis 02/26/2013    Allergies:  Allergies  Allergen Reactions   Ciprofloxacin Hives and Other (See Comments)    Started in arm as soon as drug started.   Clindamycin/Lincomycin Diarrhea and Other (See Comments)    Causes excessive diarrhea   Vicodin [Hydrocodone-Acetaminophen] Itching and Nausea And Vomiting   Penicillins Rash and Other (See Comments)    Occurred at 47 years old Has patient had a PCN reaction causing immediate rash, facial/tongue/throat swelling, SOB or lightheadedness with hypotension: yes Has patient had a PCN reaction causing severe rash involving mucus membranes or skin necrosis: no Has patient had a PCN reaction that required hospitalization no Has patient had a PCN reaction occurring within the last 10 years:no If all of the above answers are "NO", then may proceed with Cephalosporin use.    Sulfa Antibiotics Rash   Medications:  Current Outpatient Medications:    ALPRAZolam (XANAX) 0.5 MG tablet, Take 1 tablet (0.5 mg total) by mouth 3 (three) times daily as needed for anxiety., Disp: 90 tablet, Rfl: 5   amphetamine-dextroamphetamine (ADDERALL) 15 MG tablet, Take 1 tablet by mouth 2 (two) times daily., Disp: 60 tablet, Rfl: 0   amphetamine-dextroamphetamine (ADDERALL) 15 MG tablet, Take 1 tablet by mouth 2 (two) times daily., Disp: 60 tablet, Rfl: 0   amphetamine-dextroamphetamine (ADDERALL) 15 MG tablet, Take 1 tablet by mouth 2 (two) times daily., Disp: 60 tablet, Rfl: 0   Ascorbic Acid (VITAMIN C) 100 MG tablet, Take 100 mg by mouth daily., Disp: , Rfl:    estradiol (ESTRACE) 1 MG tablet, Take 1 tablet (1 mg total) by mouth daily., Disp: 30 tablet, Rfl: 4   estradiol (ESTRACE) 1 MG tablet, Take 1 tablet (1 mg total) by mouth in the morning., Disp: 30 tablet, Rfl: 1   gabapentin  (NEURONTIN) 300 MG capsule, Take 1 capsule (300 mg total) by mouth 3 (three) times daily., Disp: 90 capsule, Rfl: 1   glycopyrrolate (ROBINUL) 1 MG tablet, Take 1 tablet (1 mg total) by mouth 2 (two) times daily. (Patient taking differently: Take 1 mg by mouth daily.), Disp: 60 tablet, Rfl: 4   glycopyrrolate (ROBINUL) 1 MG tablet, Take 1 tablet (1 mg total) by mouth 2 (two) times daily., Disp: 60 tablet, Rfl: 1   hydrOXYzine (ATARAX) 25 MG tablet, Take 1 tablet (25 mg total) by mouth every 6 (six) hours as needed for anxiety., Disp: 90 tablet, Rfl: 1   hydrOXYzine (VISTARIL) 50 MG capsule, Take 1 capsule (50 mg total) by mouth every 8 (eight) hours as needed., Disp: 90 capsule, Rfl: 1   lidocaine (LIDODERM) 5 %, Place 1 patch onto the skin daily. Remove & Discard patch within 12 hours or as directed by MD, Disp: 30 patch, Rfl: 0   Multiple Vitamin (MULTIVITAMIN) tablet, Take 1 tablet by mouth daily., Disp: , Rfl:    Omega-3 Fatty Acids (FISH OIL) 1000 MG CAPS, Take 1 capsule by mouth daily., Disp: , Rfl:    ondansetron (ZOFRAN) 4 MG tablet, Take 1 tablet (4 mg total) by mouth every 6 (six)  hours. (Patient taking differently: Take 4 mg by mouth every 8 (eight) hours as needed for nausea or vomiting.), Disp: 12 tablet, Rfl: 0   OVER THE COUNTER MEDICATION, Take 2 tablets by mouth daily as needed (sleep). CBD Gummies, Disp: , Rfl:    QUEtiapine (SEROQUEL) 100 MG tablet, Take 1 tablet (100 mg total) by mouth daily at bedtime., Disp: 30 tablet, Rfl: 1   Semaglutide-Weight Management (WEGOVY) 0.25 MG/0.5ML SOAJ, Inject 0.25 mg into the skin once a week. Stop the 1 mg dose., Disp: 2 mL, Rfl: 1   sertraline (ZOLOFT) 100 MG tablet, Take 2 tablets (200 mg total) by mouth daily., Disp: 180 tablet, Rfl: 1   sertraline (ZOLOFT) 100 MG tablet, Take 2 tablets (200 mg total) by mouth in the morning., Disp: 60 tablet, Rfl: 1   vitamin B-12 (CYANOCOBALAMIN) 100 MCG tablet, Take 100 mcg by mouth daily., Disp: , Rfl:     zolpidem (AMBIEN) 10 MG tablet, Take 1 tablet (10 mg total) by mouth at bedtime as needed for sleep., Disp: 30 tablet, Rfl: 5  Observations/Objective: Patient is well-developed, well-nourished in no acute distress.  Resting comfortably at home.  Head is normocephalic, atraumatic.  No labored breathing.  Speech is clear and coherent with logical content.  Patient is alert and oriented at baseline.    Assessment and Plan: 1. Intractable migraine with aura with status migrainosus  2. Worst headache of life  - Since Saturday - Worsening with dizziness and intractable vomiting - Described as worst headache of life - Advised to seek immediate care in person at closest ER either to have someone take her or call 9-1-1 since it is worst of life   Follow Up Instructions: I discussed the assessment and treatment plan with the patient. The patient was provided an opportunity to ask questions and all were answered. The patient agreed with the plan and demonstrated an understanding of the instructions.  A copy of instructions were sent to the patient via MyChart unless otherwise noted below.    The patient was advised to call back or seek an in-person evaluation if the symptoms worsen or if the condition fails to improve as anticipated.  Time:  I spent 10 minutes with the patient via telehealth technology discussing the above problems/concerns.    Margaretann Loveless, PA-C

## 2022-05-30 ENCOUNTER — Telehealth: Payer: Commercial Managed Care - PPO | Admitting: Psychiatry

## 2022-05-30 ENCOUNTER — Other Ambulatory Visit (HOSPITAL_BASED_OUTPATIENT_CLINIC_OR_DEPARTMENT_OTHER): Payer: Self-pay

## 2022-05-30 ENCOUNTER — Encounter (HOSPITAL_BASED_OUTPATIENT_CLINIC_OR_DEPARTMENT_OTHER): Payer: Self-pay | Admitting: Pharmacist

## 2022-05-30 NOTE — Telephone Encounter (Signed)
LVM to call for follow up apt 

## 2022-06-02 NOTE — Telephone Encounter (Signed)
Please call to schedule an appt with Shanda Bumps. No show/canceled last visit.

## 2022-06-03 ENCOUNTER — Other Ambulatory Visit (HOSPITAL_BASED_OUTPATIENT_CLINIC_OR_DEPARTMENT_OTHER): Payer: Self-pay

## 2022-06-03 NOTE — Telephone Encounter (Signed)
Lvm for patient to call and schedule 

## 2022-06-06 ENCOUNTER — Ambulatory Visit: Payer: Self-pay

## 2022-06-06 ENCOUNTER — Ambulatory Visit
Admission: RE | Admit: 2022-06-06 | Discharge: 2022-06-06 | Disposition: A | Discharge status: Home / Self Care | Payer: Commercial Managed Care - PPO | Source: Ambulatory Visit | Attending: Nurse Practitioner | Admitting: Nurse Practitioner

## 2022-06-06 VITALS — BP 155/83 | HR 96 | Temp 98.0°F | Resp 18

## 2022-06-06 DIAGNOSIS — G43819 Other migraine, intractable, without status migrainosus: Secondary | ICD-10-CM | POA: Diagnosis not present

## 2022-06-06 DIAGNOSIS — G4733 Obstructive sleep apnea (adult) (pediatric): Secondary | ICD-10-CM | POA: Diagnosis not present

## 2022-06-06 MED ORDER — ELETRIPTAN HYDROBROMIDE 20 MG PO TABS: 20.0000 mg | ORAL_TABLET | Freq: Once | ORAL | 0 refills | Status: DC

## 2022-06-06 MED ORDER — KETOROLAC TROMETHAMINE 30 MG/ML IJ SOLN
30.0000 mg | Freq: Once | INTRAMUSCULAR | Status: AC
  Administered 2022-06-06: 30 mg via INTRAMUSCULAR

## 2022-06-06 MED ORDER — ONDANSETRON 4 MG PO TBDP
4.0000 mg | ORAL_TABLET | Freq: Once | ORAL | Status: AC
  Administered 2022-06-06: 4 mg via ORAL

## 2022-06-06 NOTE — Telephone Encounter (Signed)
  Chief Complaint: migraine  Symptoms: migraine top of L head, L eye pain, vomiting, aura with migraine Frequency: 2 weeks almost Pertinent Negatives: NA Disposition: ED /[x] Urgent Care (no appt availability in office) / Appointment(In office/virtual)/  [facility]/ Home Care/ Refused Recommended Disposition /[] [facility]/  Follow-up with PCP Additional Notes: pt has been triaged previously on 05/29/22 and had virtual UC but they didn't assist her besides advising ED. Pt states she aint going back to ED d/t they dont help her so scheduled UC appt today at 1345 d/t no office appts until 06/26/22.   Reason for Disposition  [1] SEVERE headache (e.g., excruciating) AND [2] not improved after 2 hours of pain medicine  Answer Assessment - Initial Assessment Questions 1. LOCATION: "Where does it hurt?"  Top headache through left eye 2. ONSET: "When did the headache start?" (Minutes, hours or days)  Saturday before last almost 2 weeks  3. PATTERN: "Does the pain come and go, or has it been constant since it started?" Yes constant  4. SEVERITY: "How bad is the pain?" and "What does it keep you from doing?" (e.g., Scale 1-10; mild, moderate, or severe) - MILD (1-3): doesn't interfere with normal activities  - MODERATE (4-7): interferes with normal activities or awakens from sleep  - SEVERE (8-10): excruciating pain, unable to do any normal activities  7 6. CAUSE: "What do you think is causing the headache?" Migraine 7. MIGRAINE: "Have you been diagnosed with migraine headaches?" If Yes, ask: "Is this headache similar?"  Yes 9. OTHER SYMPTOMS: "Do you have any other symptoms?" (fever, stiff neck, eye pain, sore throat, cold symptoms) Vomiting, left eye pain, equilibrium off  Protocols used: Headache-A-AH

## 2022-06-06 NOTE — ED Provider Notes (Signed)
[facility]    CSN: [id number] Arrival date & time: 06/06/22  1344      History   Chief Complaint Chief Complaint  Patient presents with   Headache    migraine x 2 weeks//  [employer] // [id number]// PEC NT - Entered by patient    HPI Wendy Maynard is a 47 y.o. female.  Presents for evaluation of a headache.  Patient reports history of migraines.  She reports over the past 2 weeks she has been having intermittent left-sided migraines that seem to be worsening.  She states last night it was a 10 out of 10 and she did have some vomiting but she did not want to go to the ER.  She reports this morning it is improved and rates it as a 4 out of 10.  She endorses initial auras but also has some peripheral visual changes which she states is not uncommon for her.  She also endorses some dizziness, photosensitivity, and nausea.  Denies any syncope.  States she used to be on Relpax but got off of it after hysterectomy because her migraines improved.  She has been taking Excedrin Migraine OTC which has helped some.  She did do a virtual visit on 4/17 for her headache.  Per the note she rated her headache as the worst headache of her life and was advised to go to the ER.  Patient states she has been to the ER multiple times in the past for headache and did not feel it was necessary so she did not go.  She reports her headache currently at time of evaluation is not the worst headache of her life.  She has an appointment with neurology in June.  No other concerns at this time.   Headache Associated symptoms: dizziness, nausea, photophobia and vomiting     Past Medical History:  Diagnosis Date   Abdominal pain in female patient 11/2012   Anxiety    Depression    Diabetes mellitus without complication    Migraines    Polycystic ovarian disease     Patient Active Problem List   Diagnosis Date Noted   Migraine with aura and without status migrainosus, not intractable 03/29/2021   Chronic pain  of right knee 03/29/2021   Morbid obesity with body mass index (BMI) of 40.0 to 44.9 in adult 03/29/2021   OSA on CPAP 03/29/2021   Postmenopausal hormone replacement therapy 03/29/2021   Neck muscle spasm 03/29/2021   Elevated blood pressure reading without diagnosis of hypertension 03/29/2021   Snoring 12/07/2020   Excessive sleepiness 12/07/2020   Fatigue 12/07/2020   Insomnia 12/07/2020   Sleep paralysis 12/07/2020   ADHD, predominantly inattentive type 02/06/2018   Posttraumatic stress disorder 02/06/2018   MDD (major depressive disorder), recurrent severe, without psychosis 04/28/2016   Chronic calculous cholecystitis 03/23/2013   Cholecystitis 02/26/2013    Past Surgical History:  Procedure Laterality Date   CHOLECYSTECTOMY N/A 02/26/2013   Procedure: LAPAROSCOPIC CHOLECYSTECTOMY WITH INTRAOPERATIVE CHOLANGIOGRAM;  Surgeon: Adolph Pollack, MD;  Location: [facility];  Service: General;  Laterality: N/A;   HYSTERECTOMY ABDOMINAL WITH SALPINGECTOMY     WISDOM TOOTH EXTRACTION      OB History   No obstetric history on file.      Home Medications    Prior to Admission medications   Medication Sig Start Date End Date Taking? Authorizing Provider  ALPRAZolam Prudy Feeler) 0.5 MG tablet Take 1 tablet (0.5 mg total) by mouth 3 (three) times daily as needed  for anxiety. 12/25/21   Corie Chiquito, PMHNP  amphetamine-dextroamphetamine (ADDERALL) 15 MG tablet Take 1 tablet by mouth 2 (two) times daily. 01/23/22 02/24/22  Corie Chiquito, PMHNP  amphetamine-dextroamphetamine (ADDERALL) 15 MG tablet Take 1 tablet by mouth 2 (two) times daily. 02/20/22 03/25/22  Corie Chiquito, PMHNP  amphetamine-dextroamphetamine (ADDERALL) 15 MG tablet Take 1 tablet by mouth 2 (two) times daily. 03/20/22 04/25/22  Corie Chiquito, PMHNP  Ascorbic Acid (VITAMIN C) 100 MG tablet Take 100 mg by mouth daily.    [provider]  eletriptan (RELPAX) 20 MG tablet Take 1 tablet (20 mg total) by mouth once for  1 dose. May repeat in 2 hours if headache persists or recurs.  No more than 2 doses in 24 hours. 06/06/22 06/06/22 Yes Radford Pax, NP  estradiol (ESTRACE) 1 MG tablet Take 1 tablet (1 mg total) by mouth daily. 11/26/21   Marcine Matar, MD  estradiol (ESTRACE) 1 MG tablet Take 1 tablet (1 mg total) by mouth in the morning. 05/01/22     gabapentin (NEURONTIN) 300 MG capsule Take 1 capsule (300 mg total) by mouth 3 (three) times daily. 05/01/22     glycopyrrolate (ROBINUL) 1 MG tablet Take 1 tablet (1 mg total) by mouth 2 (two) times daily. Patient taking differently: Take 1 mg by mouth daily. 01/23/22   Marcine Matar, MD  glycopyrrolate (ROBINUL) 1 MG tablet Take 1 tablet (1 mg total) by mouth 2 (two) times daily. 05/01/22     hydrOXYzine (ATARAX) 25 MG tablet Take 1 tablet (25 mg total) by mouth every 6 (six) hours as needed for anxiety. 12/19/21   Corie Chiquito, PMHNP  hydrOXYzine (VISTARIL) 50 MG capsule Take 1 capsule (50 mg total) by mouth every 8 (eight) hours as needed. 05/01/22     lidocaine (LIDODERM) 5 % Place 1 patch onto the skin daily. Remove & Discard patch within 12 hours or as directed by MD 11/02/21   Hoy Register, MD  Multiple Vitamin (MULTIVITAMIN) tablet Take 1 tablet by mouth daily.    [provider]  Omega-3 Fatty Acids (FISH OIL) 1000 MG CAPS Take 1 capsule by mouth daily.    [provider]  ondansetron (ZOFRAN) 4 MG tablet Take 1 tablet (4 mg total) by mouth every 6 (six) hours. Patient taking differently: Take 4 mg by mouth every 8 (eight) hours as needed for nausea or vomiting. 03/21/22   Roemhildt, Lorin T, PA-C  OVER THE COUNTER MEDICATION Take 2 tablets by mouth daily as needed (sleep). CBD Gummies    [provider]  QUEtiapine (SEROQUEL) 100 MG tablet Take 1 tablet (100 mg total) by mouth daily at bedtime. 05/01/22     Semaglutide-Weight Management (WEGOVY) 0.25 MG/0.5ML SOAJ Inject 0.25 mg into the skin once a week. Stop the 1 mg dose.  11/06/21   Marcine Matar, MD  sertraline (ZOLOFT) 100 MG tablet Take 2 tablets (200 mg total) by mouth daily. 12/05/21 09/01/22  Corie Chiquito, PMHNP  sertraline (ZOLOFT) 100 MG tablet Take 2 tablets (200 mg total) by mouth in the morning. 05/01/22     vitamin B-12 (CYANOCOBALAMIN) 100 MCG tablet Take 100 mcg by mouth daily.    [provider]  zolpidem (AMBIEN) 10 MG tablet Take 1 tablet (10 mg total) by mouth at bedtime as needed for sleep. 12/25/21   Corie Chiquito, PMHNP    Family History Family History  Problem Relation Age of Onset   Depression Mother  in response to grief/loss of husband   Hypertension Father        Died sudenly at age 33. Reports father may have had Aspergers   Depression Sister    Anxiety disorder Sister    ADD / ADHD Sister    ADD / ADHD Cousin    Asperger's syndrome Cousin     Social History Social History   Tobacco Use   Smoking status: Former    Types: Cigarettes    Quit date: 01/2021    Years since quitting: 1.4   Smokeless tobacco: Never   Tobacco comments:    plans to start nicotine gum  Vaping Use   Vaping Use: Never used  Substance Use Topics   Alcohol use: Yes    Comment: Occasionally 1-2 x/mth   Drug use: No    Types: Marijuana    Comment: Last use 04/27/16     Allergies   Ciprofloxacin, Clindamycin/lincomycin, Vicodin [hydrocodone-acetaminophen], Penicillins, and Sulfa antibiotics   Review of Systems Review of Systems  Eyes:  Positive for photophobia and visual disturbance.  Gastrointestinal:  Positive for nausea and vomiting.  Neurological:  Positive for dizziness and headaches.     Physical Exam Triage Vital Signs ED Triage Vitals [06/06/22 1359]  Enc Vitals Group     BP (!) 155/83     Pulse Rate 96     Resp 18     Temp 98 F (36.7 C)     Temp Source Oral     SpO2 94 %     Weight      Height      Head Circumference      Peak Flow      Pain Score 4     Pain Loc      Pain Edu?      Excl.  in GC?    No data found.  Updated Vital Signs BP (!) 155/83 (BP Location: Right Arm)   Pulse 96   Temp 98 F (36.7 C) (Oral)   Resp 18   LMP  (LMP Unknown)   SpO2 94%   Visual Acuity Right Eye Distance:   Left Eye Distance:   Bilateral Distance:    Right Eye Near:   Left Eye Near:    Bilateral Near:     Physical Exam Vitals and nursing note reviewed.  Constitutional:      General: She is not in acute distress.    Appearance: Normal appearance. She is not ill-appearing, toxic-appearing or diaphoretic.  HENT:     Head: Normocephalic and atraumatic.     Right Ear: Tympanic membrane and ear canal normal.     Left Ear: Tympanic membrane and ear canal normal.     Nose: Nose normal.  Eyes:     General: No scleral icterus.       Right eye: No discharge.        Left eye: No discharge.     Conjunctiva/sclera: Conjunctivae normal.     Pupils: Pupils are equal, round, and reactive to light.  Cardiovascular:     Rate and Rhythm: Normal rate and regular rhythm.     Heart sounds: Normal heart sounds.  Pulmonary:     Effort: Pulmonary effort is normal.     Breath sounds: Normal breath sounds.  Skin:    General: Skin is warm and dry.  Neurological:     General: No focal deficit present.     Mental Status: She is alert and oriented to person, place,  and time.     GCS: GCS eye subscore is 4. GCS verbal subscore is 5. GCS motor subscore is 6.     Cranial Nerves: No facial asymmetry.     Motor: No weakness.     Coordination: Romberg sign negative. Finger-Nose-Finger Test normal.     Gait: Gait is intact.  Psychiatric:        Mood and Affect: Mood normal.        Behavior: Behavior normal.      UC Treatments / Results  Labs (all labs ordered are listed, but only abnormal results are displayed) Labs Reviewed - No data to display  EKG   Radiology No results found.  Procedures Procedures (including critical care time)  Medications Ordered in UC Medications   ketorolac (TORADOL) 30 MG/ML injection 30 mg (30 mg Intramuscular Given 06/06/22 1451)  ondansetron (ZOFRAN-ODT) disintegrating tablet 4 mg (4 mg Oral Given 06/06/22 1451)    Initial Impression / Assessment and Plan / UC Course  I have reviewed the triage vital signs and the nursing notes.  Pertinent labs & imaging results that were available during my care of the patient were reviewed by me and considered in my medical decision making (see chart for details).     Reviewed recent telemedicine encounter for her migraines as well as labs from February 2024. Patient given Toradol injection in clinic.  She has had this in the past with improvement of her migraines.  She was monitored for 10 minutes after injection with no reaction noted and tolerated well.  She was informed no NSAIDs for 24 hours and she verbalized understanding she was also given Zofran for nausea. Rx for Relpax sent to pharmacy.  She has been on this in the past and tolerated well Advised to keep a headache diary and take to her neurologist appointment in June Follow-up with your PCP in 2 days for recheck Strict ER precautions reviewed and patient verbalized understanding. Final Clinical Impressions(s) / UC Diagnoses   Final diagnoses:  Other migraine without status migrainosus, intractable     Discharge Instructions      You were given a Toradol injection in clinic today. Do not take any over the counter NSAID's such as Advil, ibuprofen, Aleve, or naproxen for 24 hours.  You may take tylenol if needed I refilled your prescription for Relpax.  Do not take more than 2 in a 24-hour period Please keep headache diary and take to your neurologist appointment in June Please follow-up with your PCP in 2 days for recheck Please go to the emergency room if you develop any worsening symptoms     ED Prescriptions     Medication Sig Dispense Auth. Provider   eletriptan (RELPAX) 20 MG tablet Take 1 tablet (20 mg total) by  mouth once for 1 dose. May repeat in 2 hours if headache persists or recurs.  No more than 2 doses in 24 hours. 10 tablet Radford Pax, NP      PDMP not reviewed this encounter.   Radford Pax, NP 06/06/22 [phone]

## 2022-06-06 NOTE — Telephone Encounter (Signed)
Noted  

## 2022-06-06 NOTE — ED Triage Notes (Signed)
Pt c/o migraine headaches for past 2 months and worse in past week. States having vomiting and sensitive to light. States has a appt with Neuro in June. Taking OTC meds with no relief.

## 2022-06-06 NOTE — Discharge Instructions (Signed)
You were given a Toradol injection in clinic today. Do not take any over the counter NSAID's such as Advil, ibuprofen, Aleve, or naproxen for 24 hours.  You may take tylenol if needed I refilled your prescription for Relpax.  Do not take more than 2 in a 24-hour period Please keep headache diary and take to your neurologist appointment in June Please follow-up with your PCP in 2 days for recheck Please go to the emergency room if you develop any worsening symptoms

## 2022-06-07 ENCOUNTER — Other Ambulatory Visit (HOSPITAL_BASED_OUTPATIENT_CLINIC_OR_DEPARTMENT_OTHER): Payer: Self-pay

## 2022-06-07 ENCOUNTER — Encounter (HOSPITAL_BASED_OUTPATIENT_CLINIC_OR_DEPARTMENT_OTHER): Payer: Self-pay | Admitting: Pharmacist

## 2022-06-11 ENCOUNTER — Ambulatory Visit: Payer: Commercial Managed Care - PPO | Admitting: Psychiatry

## 2022-06-20 ENCOUNTER — Other Ambulatory Visit (HOSPITAL_BASED_OUTPATIENT_CLINIC_OR_DEPARTMENT_OTHER): Payer: Self-pay

## 2022-06-20 ENCOUNTER — Telehealth (INDEPENDENT_AMBULATORY_CARE_PROVIDER_SITE_OTHER): Payer: Commercial Managed Care - PPO | Admitting: Psychiatry

## 2022-06-20 ENCOUNTER — Encounter: Payer: Self-pay | Admitting: Psychiatry

## 2022-06-20 DIAGNOSIS — F3342 Major depressive disorder, recurrent, in full remission: Secondary | ICD-10-CM | POA: Diagnosis not present

## 2022-06-20 DIAGNOSIS — F4310 Post-traumatic stress disorder, unspecified: Secondary | ICD-10-CM

## 2022-06-20 DIAGNOSIS — F5101 Primary insomnia: Secondary | ICD-10-CM

## 2022-06-20 MED ORDER — ZOLPIDEM TARTRATE 10 MG PO TABS
10.0000 mg | ORAL_TABLET | Freq: Every evening | ORAL | 2 refills | Status: DC | PRN
  Filled 2022-06-20 – 2022-06-22 (×3): qty 15, 15d supply, fill #0
  Filled 2022-07-22: qty 15, 15d supply, fill #1
  Filled 2022-08-20: qty 15, 15d supply, fill #2

## 2022-06-20 MED ORDER — GABAPENTIN 300 MG PO CAPS
300.0000 mg | ORAL_CAPSULE | Freq: Three times a day (TID) | ORAL | 2 refills | Status: DC
  Filled 2022-07-19: qty 90, 30d supply, fill #0
  Filled 2022-09-19 (×2): qty 90, 30d supply, fill #1
  Filled 2022-10-21 (×2): qty 90, 30d supply, fill #2

## 2022-06-20 MED ORDER — QUETIAPINE FUMARATE 100 MG PO TABS
100.0000 mg | ORAL_TABLET | Freq: Every day | ORAL | 0 refills | Status: DC
  Filled 2022-06-20 – 2022-06-21 (×2): qty 90, 90d supply, fill #0
  Filled 2022-06-27: qty 60, 60d supply, fill #0
  Filled 2022-06-27: qty 30, 30d supply, fill #0

## 2022-06-20 MED ORDER — HYDROXYZINE PAMOATE 50 MG PO CAPS
50.0000 mg | ORAL_CAPSULE | Freq: Three times a day (TID) | ORAL | 1 refills | Status: DC
  Filled 2022-06-20 – 2022-06-27 (×3): qty 90, 30d supply, fill #0

## 2022-06-20 MED ORDER — SERTRALINE HCL 100 MG PO TABS
200.0000 mg | ORAL_TABLET | Freq: Every day | ORAL | 1 refills | Status: DC
  Filled 2022-06-20 – 2022-10-10 (×2): qty 180, 90d supply, fill #0

## 2022-06-20 NOTE — Progress Notes (Signed)
Wendy Maynard [id number] Jun 21, 1975 47 y.o.  Virtual Visit via Video Note  I connected with pt @ on 06/20/22 at 10:00 AM EDT by a video enabled telemedicine application and verified that I am speaking with the correct person using two identifiers.   I discussed the limitations of evaluation and management by telemedicine and the availability of in person appointments. The patient expressed understanding and agreed to proceed.  I discussed the assessment and treatment plan with the patient. The patient was provided an opportunity to ask questions and all were answered. The patient agreed with the plan and demonstrated an understanding of the instructions.   The patient was advised to call back or seek an in-person evaluation if the symptoms worsen or if the condition fails to improve as anticipated.  I provided 30 minutes of non-face-to-face time during this encounter.  The patient was located in her personal vehicle in [place].  The provider was located at [facility].   Corie Chiquito, PMHNP   Subjective:   Patient ID:  Wendy Maynard is a 47 y.o. (DOB Jun 30, 1975) female.  Chief Complaint:  Chief Complaint  Patient presents with   Follow-up    Anxiety, depression, ADHD, and insomnia    HPI Wendy Maynard presents for follow-up of anxiety, depression, insomnia, and ADHD. She reports that she went to rehab in [place] for 30 days for substance abuse. She reports that she took some medication "from someone I didn't know" in an effort to relieve migraines and that she had an adverse reaction to medication. She reports using marijuana off and on in her 30's and again in the last year. She reports that she was also taking Delta 8. She reports, "I think the trauma is why I started using again." She reports that substance use seemed to be triggered by PTSD symptoms and migraines.   She reports that during rehab she was starting on Seroquel and Gabapentin. She reports that Alprazolam was  discontinued during rehab. She reports that she had Alprazolam filled on 05/24/22 to help with panic attacks. She reports that she is getting a few panic attacks a week. She reports that she has had increased intrusive memories of past traumatic events. She has not been taking Adderall and returned to work about a month ago and has not felt like she needs it. She denies any h/o abusing Adderall. She notices she is less irritable and anxious off Adderall. "I'm still anxious, but not as anxious as I was." She reports that she is no longer taking Ambien nightly and estimates taking it 2-3 times a week. She reports that her sleep and mood have been improved with Seroquel."My depression has gotten better. My anxiety has gotten better." Denies current depression. She reports that her energy and motivation have been "pretty good." Denies SI.   Denies h/o misusing Alprazolam or Ambien.   She denies any cravings for marijuana. "I probably will not use it again."   Past Psychiatric Medication Trials: Abilify- Helped mood. Wt gain.  Sertraline-Helpful  Lexapro- Felt tired and irritable Prozac Cymbalta- adverse effects Wellbutrin XL- Insomnia Trazodone-ineffective Ambien Xanax Lorazepam Adderall  Adderall XR- Insomnia, irritability, dry mouth  Review of Systems:  Review of Systems  Musculoskeletal:  Negative for gait problem.  Neurological:  Positive for headaches.  Psychiatric/Behavioral:         Please refer to HPI    Medications: I have reviewed the patient's current medications.  Current Outpatient Medications  Medication Sig Dispense Refill   Ascorbic Acid (  VITAMIN C) 100 MG tablet Take 100 mg by mouth daily.     CALCIUM PO Take by mouth.     CHELATED MAGNESIUM PO Take by mouth.     estradiol (ESTRACE) 1 MG tablet Take 1 tablet (1 mg total) by mouth daily. 30 tablet 4   estradiol (ESTRACE) 1 MG tablet Take 1 tablet (1 mg total) by mouth in the morning. 30 tablet 1   glycopyrrolate  (ROBINUL) 1 MG tablet Take 1 tablet (1 mg total) by mouth 2 (two) times daily. 60 tablet 1   Multiple Vitamin (MULTIVITAMIN) tablet Take 1 tablet by mouth daily.     Omega-3 Fatty Acids (FISH OIL ADULT GUMMIES PO) Take by mouth.     vitamin B-12 (CYANOCOBALAMIN) 100 MCG tablet Take 100 mcg by mouth daily.     eletriptan (RELPAX) 20 MG tablet Take 1 tablet (20 mg total) by mouth once for 1 dose. May repeat in 2 hours if headache persists or recurs.  No more than 2 doses in 24 hours. 10 tablet 0   [START ON 07/01/2022] gabapentin (NEURONTIN) 300 MG capsule Take 1 capsule (300 mg total) by mouth 3 (three) times daily. 90 capsule 2   glycopyrrolate (ROBINUL) 1 MG tablet Take 1 tablet (1 mg total) by mouth 2 (two) times daily. (Patient taking differently: Take 1 mg by mouth daily.) 60 tablet 4   hydrOXYzine (VISTARIL) 50 MG capsule Take 1 capsule (50 mg total) by mouth every 8 (eight) hours as needed. 90 capsule 1   lidocaine (LIDODERM) 5 % Place 1 patch onto the skin daily. Remove & Discard patch within 12 hours or as directed by MD 30 patch 0   ondansetron (ZOFRAN) 4 MG tablet Take 1 tablet (4 mg total) by mouth every 6 (six) hours. (Patient not taking: Reported on 06/20/2022) 12 tablet 0   QUEtiapine (SEROQUEL) 100 MG tablet Take 1 tablet (100 mg total) by mouth daily at bedtime. 90 tablet 0   sertraline (ZOLOFT) 100 MG tablet Take 2 tablets (200 mg total) by mouth daily. 180 tablet 1   zolpidem (AMBIEN) 10 MG tablet Take 1 tablet (10 mg total) by mouth at bedtime as needed. 15 tablet 2   No current facility-administered medications for this visit.    Medication Side Effects: Other: Some weight gain with Seroquel  Allergies:  Allergies  Allergen Reactions   Ciprofloxacin Hives and Other (See Comments)    Started in arm as soon as drug started.   Clindamycin/Lincomycin Diarrhea and Other (See Comments)    Causes excessive diarrhea   Vicodin [Hydrocodone-Acetaminophen] Itching and Nausea And  Vomiting   Penicillins Rash and Other (See Comments)    Occurred at 47 years old Has patient had a PCN reaction causing immediate rash, facial/tongue/throat swelling, SOB or lightheadedness with hypotension: yes Has patient had a PCN reaction causing severe rash involving mucus membranes or skin necrosis: no Has patient had a PCN reaction that required hospitalization no Has patient had a PCN reaction occurring within the last 10 years:no If all of the above answers are "NO", then may proceed with Cephalosporin use.    Sulfa Antibiotics Rash    Past Medical History:  Diagnosis Date   Abdominal pain in female patient 11/2012   Anxiety    Depression    Diabetes mellitus without complication (HCC)    Migraines    Polycystic ovarian disease     Family History  Problem Relation Age of Onset   Depression Mother  in response to grief/loss of husband   Hypertension Father        Died sudenly at age 74. Reports father may have had Aspergers   Depression Sister    Anxiety disorder Sister    ADD / ADHD Sister    ADD / ADHD Cousin    Asperger's syndrome Cousin     Social History   Socioeconomic History   Marital status: Single    Spouse name: Not on file   Number of children: 0   Years of education: Not on file   Highest education level: Master's degree (e.g., MA, MS, MEng, MEd, MSW, MBA)  Occupational History   Occupation: Retail buyer at [employer]  Tobacco Use   Smoking status: Former    Types: Cigarettes    Quit date: 01/2021    Years since quitting: 1.4   Smokeless tobacco: Never   Tobacco comments:    plans to start nicotine gum  Vaping Use   Vaping Use: Never used  Substance and Sexual Activity   Alcohol use: Yes    Comment: Occasionally 1-2 x/mth   Drug use: No    Types: Marijuana    Comment: Last use 04/27/16   Sexual activity: Not Currently    Birth control/protection: Condom  Other Topics Concern   Not on file  Social History Narrative   Not on file    Social Determinants of Health   Financial Resource Strain: Not on file  Food Insecurity: Not on file  Transportation Needs: Not on file  Physical Activity: Not on file  Stress: Not on file  Social Connections: Not on file  Intimate Partner Violence: Not on file    Past Medical History, Surgical history, Social history, and Family history were reviewed and updated as appropriate.   Please see review of systems for further details on the patient's review from today.   Objective:   Physical Exam:  LMP  (LMP Unknown)   Physical Exam Neurological:     Mental Status: She is alert and oriented to person, place, and time.     Cranial Nerves: No dysarthria.  Psychiatric:        Attention and Perception: Attention and perception normal.        Mood and Affect: Mood is not depressed.        Speech: Speech normal.        Behavior: Behavior is cooperative.        Thought Content: Thought content normal. Thought content is not paranoid or delusional. Thought content does not include homicidal or suicidal ideation. Thought content does not include homicidal or suicidal plan.        Cognition and Memory: Cognition and memory normal.        Judgment: Judgment normal.     Comments: Insight intact Some anxiety in response to past traumatic events     Lab Review:     Component Value Date/Time   NA 139 03/29/2022 2347   NA 141 03/29/2021 1514   K 3.2 (L) 03/29/2022 2347   CL 99 03/29/2022 2347   CO2 27 03/29/2022 2347   GLUCOSE 177 (H) 03/29/2022 2347   BUN 15 03/29/2022 2347   BUN 13 03/29/2021 1514   CREATININE 0.91 03/29/2022 2347   CALCIUM 8.9 03/29/2022 2347   PROT 7.3 03/29/2021 1514   ALBUMIN 4.9 (H) 03/29/2021 1514   AST 15 03/29/2021 1514   ALT 14 03/29/2021 1514   ALKPHOS 114 03/29/2021 1514   BILITOT <0.2 03/29/2021  1514   GFRNONAA >60 03/29/2022 2347   GFRAA >60 04/28/2016 1836       Component Value Date/Time   WBC 18.6 (H) 03/29/2022 2347   RBC 5.29 (H)  03/29/2022 2347   HGB 12.8 03/29/2022 2347   HGB 12.1 03/29/2021 1514   HCT 41.6 03/29/2022 2347   HCT 37.9 03/29/2021 1514   PLT 258 03/29/2022 2347   PLT 263 03/29/2021 1514   MCV 78.6 (L) 03/29/2022 2347   MCV 74 (L) 03/29/2021 1514   MCH 24.2 (L) 03/29/2022 2347   MCHC 30.8 03/29/2022 2347   RDW 14.9 03/29/2022 2347   RDW 15.4 03/29/2021 1514   LYMPHSABS 1.6 03/29/2022 2347   MONOABS 0.6 03/29/2022 2347   EOSABS 0.2 03/29/2022 2347   BASOSABS 0.1 03/29/2022 2347    No results found for: "POCLITH", "LITHIUM"   No results found for: "PHENYTOIN", "PHENOBARB", "VALPROATE", "CBMZ"   .res Assessment: Plan:    Discussed recent substance abuse and rehab treatment. Reviewed medication changes and discussed prescribed controlled substances and trying to minimize use of medications with higher abuse potential. Pt reports that she would like to stop Alprazolam prn to determine if she can manage anxiety without medication and she questions if it may be preventing her from processing trauma. She reports that she would like to process past traumatic events and would be interested in trauma therapy. She reports that she and her therapist have previously discussed her possibly seeing a trauma therapist in the future, and she is interested in pursuing this. Encouraged pt to discuss this further with her therapist during apt scheduled for next week.  She reports that she has not been taking Adderall and feels that she does not need it.  She reports that she is taking Ambien prn insomnia periodically and requests to have a limited number of Ambien to take periodically. Will change Ambien to 10 mg po QHS prn insomnia.  Continue Sertraline 200 mg po qd for anxiety and depression.  Continue Seroquel 100 mg po QHS for depression, anxiety, and insomnia.  Continue Hydroxyzine 50 mg po q 8 hours prn anxiety.  Continue Gabapentin 300 mg po TID for anxiety.  Agree with plan to see Neurologist/Headache  Specialist since headaches have contributed to substance use in the past.  Recommend continuing psychotherapy.  Pt to follow-up with this provider in 4 weeks or sooner if clinically indicated.  Patient advised to contact office with any questions, adverse effects, or acute worsening in signs and symptoms.  I spent 30 minutes dedicated to the care of this patient on the date of this  encounter to include pre-visit review of records, face-to-face time with the patient discussing recent rehab treatment, ordering of medication, and post visit documentation.   Wendy Maynard was seen today for follow-up.  Diagnoses and all orders for this visit:  Posttraumatic stress disorder -     sertraline (ZOLOFT) 100 MG tablet; Take 2 tablets (200 mg total) by mouth daily.  Primary insomnia -     zolpidem (AMBIEN) 10 MG tablet; Take 1 tablet (10 mg total) by mouth at bedtime as needed.  Recurrent major depressive disorder, in full remission (HCC) -     sertraline (ZOLOFT) 100 MG tablet; Take 2 tablets (200 mg total) by mouth daily.  Other orders -     QUEtiapine (SEROQUEL) 100 MG tablet; Take 1 tablet (100 mg total) by mouth daily at bedtime. -     hydrOXYzine (VISTARIL) 50 MG capsule; Take 1 capsule (50  mg total) by mouth every 8 (eight) hours as needed. -     gabapentin (NEURONTIN) 300 MG capsule; Take 1 capsule (300 mg total) by mouth 3 (three) times daily.     Please see After Visit Summary for patient specific instructions.  Future Appointments  Date Time Provider Department Center  06/27/2022  5:00 PM Mathis Fare, LCSW [facility] None  07/17/2022  3:30 PM Anson Fret, MD [facility] None    No orders of the defined types were placed in this encounter.     -------------------------------

## 2022-06-21 ENCOUNTER — Other Ambulatory Visit (HOSPITAL_BASED_OUTPATIENT_CLINIC_OR_DEPARTMENT_OTHER): Payer: Self-pay

## 2022-06-22 ENCOUNTER — Other Ambulatory Visit (HOSPITAL_BASED_OUTPATIENT_CLINIC_OR_DEPARTMENT_OTHER): Payer: Self-pay

## 2022-06-27 ENCOUNTER — Ambulatory Visit (INDEPENDENT_AMBULATORY_CARE_PROVIDER_SITE_OTHER): Payer: Commercial Managed Care - PPO | Admitting: Psychiatry

## 2022-06-27 ENCOUNTER — Other Ambulatory Visit (HOSPITAL_BASED_OUTPATIENT_CLINIC_OR_DEPARTMENT_OTHER): Payer: Self-pay

## 2022-06-27 DIAGNOSIS — F4310 Post-traumatic stress disorder, unspecified: Secondary | ICD-10-CM | POA: Diagnosis not present

## 2022-06-27 NOTE — Progress Notes (Signed)
[facility] Counselor/Therapist Progress Note  Patient ID: Wendy Maynard, MRN: [id number],    Date: 06/27/2022  Time Spent: 50 minutes   Treatment Type: Individual Therapy  Reported Symptoms: anxiety, bad dreams more about past (people and incidents) and "I realized this while in rehab and realized I have shoved things down"), denies depression and "I really feel more peace after being in rehab"  Mental Status Exam:  Appearance:   Casual     Behavior:  Appropriate, Sharing, and Motivated  Motor:  Normal  Speech/Language:   Clear and Coherent  Affect:  anxious  Mood:  anxious  Thought process:  goal directed  Thought content:    WNL  Sensory/Perceptual disturbances:    WNL  Orientation:  oriented to person, place, time/date, situation, day of week, month of year, year, and stated date of Jun 27, 2022  Attention:  Good  Concentration:  Good  Memory:  WNL  Fund of knowledge:   Good  Insight:    Good  Judgment:   Good  Impulse Control:  Good   Risk Assessment: Danger to Self:  No Self-injurious Behavior: No Danger to Others: No Duty to Warn:no Physical Aggression / Violence:No  Access to Firearms a concern: No  Gang Involvement:No   Subjective:  Patient in today reporting anxiety, denies any depression, denies any of alcohol nor drug, attending Narcotics Anonymous, attends Codependent Anonymous group online. Showing good motivation and more openness today. Feeling better about her progress after being in Rehab and eventually "realized I wasn't beyond all my past trauma as much as I thought I had been". Stated I really thought earlier I wasn't as affected by the past and could work hard now and move forward. Ended up recently after overdose ("not intended to end her life"), but realized later and continues to realize now she does still have past trauma to work through in order to eventually move forward. Agreed and totally support patient in this. Is going to see a therapist in  our practice who is trained in trauma-focused therapy and her first appointment with that therapist is scheduled for 07/11/2022.  Patient motivated and seems to be at a good place emotionally to take this neck step.  Interventions: Cognitive Behavioral Therapy and Ego-Supportive  Long term goal: Stabilize anxiety level while increasing ability to function on a daily basis. Short term goal: Identify major life conflicts from the past and present that form the basis for present anxiety. Strategies: Reinforced client's insights into the role of her past emotional pain and present anxiety.  Support patient's work in further resolving these issues.   Diagnosis:   ICD-10-CM   1. Posttraumatic stress disorder  F43.10      Plan:   Patient in today showing good participation in session as she worked on some anxiety, although denied any depression.  Also denies any use of alcohol or drugs and is currently attending Narcotics Anonymous, and also Codependents Anonymous online.  Showed good motivation and participation in session today and as noted above will be following up in some therapy with a different therapist in our practice who has specialized training and trauma focused therapy and her appointment with that therapist is scheduled for Jul 11, 2022. Encouraged patient to continue her journaling and bring it in with her to sessions, and practice positive behaviors as discussed in session including: Getting outside as she is able and walking as she is able, making good decisions especially in regards to boundary issues,  positive self talk, interacting more with coworkers in positive ways, believing in herself more and her ability to make positive changes now and into the future, continue to move further away from her past and more into the present/future with goal-directed behaviors, intentionally look for more positives versus negatives each day, search for more the positives within herself and be able to  name them, allow for good sleep patterns, and recognize the strengths she shows working with goal-directed behaviors to move in a direction that supports her improved emotional health and overall wellbeing.  Goal review and progress/challenges noted with patient.  Next appointment within??   Possible transfer to another therapist for trauma therapy.   Mathis Fare, LCSW

## 2022-07-06 DIAGNOSIS — G4733 Obstructive sleep apnea (adult) (pediatric): Secondary | ICD-10-CM | POA: Diagnosis not present

## 2022-07-11 ENCOUNTER — Ambulatory Visit: Payer: Commercial Managed Care - PPO | Admitting: Psychiatry

## 2022-07-17 ENCOUNTER — Ambulatory Visit: Payer: Commercial Managed Care - PPO | Admitting: Neurology

## 2022-07-17 ENCOUNTER — Other Ambulatory Visit (HOSPITAL_BASED_OUTPATIENT_CLINIC_OR_DEPARTMENT_OTHER): Payer: Self-pay

## 2022-07-17 ENCOUNTER — Encounter: Payer: Self-pay | Admitting: Neurology

## 2022-07-17 VITALS — BP 143/92 | HR 84 | Ht 67.0 in | Wt 263.6 lb

## 2022-07-17 DIAGNOSIS — G43709 Chronic migraine without aura, not intractable, without status migrainosus: Secondary | ICD-10-CM | POA: Insufficient documentation

## 2022-07-17 MED ORDER — FREMANEZUMAB-VFRM 225 MG/1.5ML ~~LOC~~ SOSY: 225.0000 mg | PREFILLED_SYRINGE | Freq: Once | SUBCUTANEOUS | Status: AC

## 2022-07-17 MED ORDER — AJOVY 225 MG/1.5ML ~~LOC~~ SOAJ: 225.0000 mg | SUBCUTANEOUS | 11 refills | Status: DC

## 2022-07-17 MED ORDER — SUMATRIPTAN SUCCINATE 100 MG PO TABS
100.0000 mg | ORAL_TABLET | Freq: Once | ORAL | 12 refills | Status: DC | PRN
  Filled 2022-07-17: qty 9, 30d supply, fill #0
  Filled 2023-05-08: qty 9, 30d supply, fill #1
  Filled 2023-06-03: qty 9, 30d supply, fill #2

## 2022-07-17 MED ORDER — TOPIRAMATE 50 MG PO TABS
ORAL_TABLET | ORAL | 11 refills | Status: DC
  Filled 2022-07-17: qty 60, 30d supply, fill #0
  Filled 2022-08-20: qty 60, 30d supply, fill #1
  Filled 2022-09-19 (×2): qty 60, 30d supply, fill #2

## 2022-07-17 NOTE — Patient Instructions (Addendum)
Preventative: Start Topiramate 50mg  then increase to 100mg  at bedtime Preventative: Start [name] samples (emgality?) Acute(as needed): Relpax and try Sumatriptan. If these fail Ubrelvy, Nurtec or Zavzpret Low threshold for MRI brain  w/wo contrast  Meds ordered this encounter  Medications   topiramate (TOPAMAX) 50 MG tablet    Sig: Take 1 tablet by mouth at bedtime. In 1-2 weeks, may increase to 2 tablets at bedtime    Dispense:  60 tablet    Refill:  11   Fremanezumab-vfrm SOSY 225 mg   Fremanezumab-vfrm ([name]) 225 MG/1.5ML SOAJ    Sig: Inject 225 mg into the skin every 30 (thirty) days.    Dispense:  1.5 mL    Refill:  11   SUMAtriptan (IMITREX) 100 MG tablet    Sig: Take 1 tablet (100 mg total) by mouth once as needed for up to 1 dose. May repeat in 2 hours if headache persists or recurs.    Dispense:  10 tablet    Refill:  12    "There is increased risk for stroke in women with migraine with aura and a contraindication for the combined contraceptive pill for use by women who have migraine with aura. The risk for women with migraine without aura is lower. However other risk factors like smoking are far more likely to increase stroke risk than migraine. There is a recommendation for no smoking and for the use of OCPs without estrogen such as progestogen only pills particularly for women with migraine with aura.Marland Kitchen People who have migraine headaches with auras may be 3 times more likely to have a stroke caused by a blood clot, compared to migraine patients who don't see auras. Women who take hormone-replacement therapy may be 30 percent more likely to suffer a clot-based stroke than women not taking medication containing estrogen. Other risk factors like smoking and high blood pressure may be  much more important."  Fremanezumab Injection What is this medication? FREMANEZUMAB (fre ma NEZ ue mab) prevents migraines. It works by blocking a substance in the body that causes migraines. It is a  monoclonal antibody. This medicine may be used for other purposes; ask your health care provider or pharmacist if you have questions. COMMON BRAND NAME(S): [name] What should I tell my care team before I take this medication? They need to know if you have any of these conditions: An unusual or allergic reaction to fremanezumab, other medications, foods, dyes, or preservatives Pregnant or trying to get pregnant Breast-feeding How should I use this medication? This medication is injected under the skin. You will be taught how to prepare and give it. Take it as directed on the prescription label. Keep taking it unless your care team tells you to stop. It is important that you put your used needles and syringes in a special sharps container. Do not put them in a trash can. If you do not have a sharps container, call your pharmacist or care team to get one. Talk to your care team about the use of this medication in children. Special care may be needed. Overdosage: If you think you have taken too much of this medicine contact a poison control center or emergency room at once. NOTE: This medicine is only for you. Do not share this medicine with others. What if I miss a dose? If you miss a dose, take it as soon as you can. If it is almost time for your next dose, take only that dose. Do not take double or extra  doses. What may interact with this medication? Interactions are not expected. This list may not describe all possible interactions. Give your health care provider a list of all the medicines, herbs, non-prescription drugs, or dietary supplements you use. Also tell them if you smoke, drink alcohol, or use illegal drugs. Some items may interact with your medicine. What should I watch for while using this medication? Tell your care team if your symptoms do not start to get better or if they get worse. What side effects may I notice from receiving this medication? Side effects that you should report  to your care team as soon as possible: Allergic reactions or angioedema--skin rash, itching or hives, swelling of the face, eyes, lips, tongue, arms, or legs, trouble swallowing or breathing Side effects that usually do not require medical attention (report to your care team if they continue or are bothersome): Pain, redness, or irritation at injection site This list may not describe all possible side effects. Call your doctor for medical advice about side effects. You may report side effects to FDA at 1-800-FDA-1088. Where should I keep my medication? Keep out of the reach of children and pets. Store in a refrigerator or at room temperature between 20 and 25 degrees C (68 and 77 degrees F). Refrigeration (preferred): Store in the refrigerator. Do not freeze. Keep in the original container until you are ready to take it. Remove the dose from the carton about 30 minutes before it is time for you to use it. If the dose is not used, it may be stored in the original container at room temperature for 7 days. Get rid of any unused medication after the expiration date. Room Temperature: This medication may be stored at room temperature for up to 7 days. Keep it in the original container. Protect from light until time of use. If it is stored at room temperature, get rid of any unused medication after 7 days or after it expires, whichever is first. To get rid of medications that are no longer needed or have expired: Take the medication to a medication take-back program. Check with your pharmacy or law enforcement to find a location. If you cannot return the medication, ask your pharmacist or care team how to get rid of this medication safely. NOTE: This sheet is a summary. It may not cover all possible information. If you have questions about this medicine, talk to your doctor, pharmacist, or health care provider.  2024 [employer]vier/Gold Standard (2021-03-23 00:00:00) Topiramate Tablets What is this  medication? TOPIRAMATE (toe PYRE a mate) prevents and controls seizures in people with epilepsy. It may also be used to prevent migraine headaches. It works by calming overactive nerves in your body. This medicine may be used for other purposes; ask your health care provider or pharmacist if you have questions. COMMON BRAND NAME(S): Topamax, Topiragen What should I tell my care team before I take this medication? They need to know if you have any of these conditions: Bleeding disorder Kidney disease Lung disease Suicidal thoughts, plans, or attempt by you or a family member An unusual or allergic reaction to topiramate, other medications, foods, dyes, or preservatives Pregnant or trying to get pregnant Breast-feeding How should I use this medication? Take this medication by mouth with water. Take it as directed on the prescription label at the same time every day. Do not cut, crush or chew this medicine. Swallow the tablets whole. You can take it with or without food. If it upsets your stomach,  take it with food. Keep taking it unless your care team tells you to stop. A special MedGuide will be given to you by the pharmacist with each prescription and refill. Be sure to read this information carefully each time. Talk to your care team about the use of this medication in children. While it may be prescribed for children as young as 2 years for selected conditions, precautions do apply. Overdosage: If you think you have taken too much of this medicine contact a poison control center or emergency room at once. NOTE: This medicine is only for you. Do not share this medicine with others. What if I miss a dose? If you miss a dose, take it as soon as you can unless it is within 6 hours of the next dose. If it is within 6 hours of the next dose, skip the missed dose. Take the next dose at the normal time. Do not take double or extra doses. What may interact with this  medication? Acetazolamide Alcohol Antihistamines for allergy, cough, and cold Aspirin and aspirin-like medications Atropine Certain medications for anxiety or sleep Certain medications for bladder problems, such as oxybutynin, tolterodine Certain medications for depression, such as amitriptyline, fluoxetine, sertraline Certain medications for Parkinson disease, such as benztropine, trihexyphenidyl Certain medications for seizures, such as carbamazepine, lamotrigine, phenobarbital, phenytoin, primidone, valproic acid, zonisamide Certain medications for stomach problems, such as dicyclomine, hyoscyamine Certain medications for travel sickness, such as scopolamine Certain medications that treat or prevent blood clots, such as warfarin, enoxaparin, dalteparin, apixaban, dabigatran, rivaroxaban Digoxin Diltiazem Estrogen and progestin hormones General anesthetics, such as halothane, isoflurane, methoxyflurane, propofol Glyburide Hydrochlorothiazide Ipratropium Lithium Medications that relax muscles Metformin NSAIDs, medications for pain and inflammation, such as ibuprofen or naproxen Opioid medications for pain Phenothiazines, such as chlorpromazine, mesoridazine, prochlorperazine, thioridazine Pioglitazone This list may not describe all possible interactions. Give your health care provider a list of all the medicines, herbs, non-prescription drugs, or dietary supplements you use. Also tell them if you smoke, drink alcohol, or use illegal drugs. Some items may interact with your medicine. What should I watch for while using this medication? Visit your care team for regular checks on your progress. Tell your care team if your symptoms do not start to get better or if they get worse. Do not suddenly stop taking this medication. You may develop a severe reaction. Your care team will tell you how much medication to take. If your care team wants you to stop the medication, the dose may be slowly  lowered over time to avoid any side effects. Wear a medical ID bracelet or chain. Carry a card that describes your condition. List the medications and doses you take on the card. This medication may affect your coordination, reaction time, or judgment. Do not drive or operate machinery until you know how this medication affects you. Sit up or stand slowly to reduce the risk of dizzy or fainting spells. Drinking alcohol with this medication can increase the risk of these side effects. This medication may cause serious skin reactions. They can happen weeks to months after starting the medication. Contact your care team right away if you notice fevers or flu-like symptoms with a rash. The rash may be red or purple and then turn into blisters or peeling of the skin. You may also notice a red rash with swelling of the face, lips, or lymph nodes in your neck or under your arms. This medication may cause thoughts of suicide or depression. This includes  sudden changes in mood, behaviors, or thoughts. These changes can happen at any time but are more common in the beginning of treatment or after a change in dose. Call your care team right away if you experience these thoughts or worsening depression. This medication may slow your child's growth if it is taken for a long time at high doses. Your child's care team will monitor your child's growth. Using this medication for a long time may weaken your bones. The risk of bone fractures may be increased. Talk to your care team about your bone health. Discuss this medication with your care team if you may be pregnant. Serious birth defects can occur if you take this medication during pregnancy. There are benefits and risks to taking medications during pregnancy. Your care team can help you find the option that works for you. Contraception is recommended while taking this medication. Estrogen and progestin hormones may not work as well while you are taking this medication.  Your care team can help you find the option that works for you. Talk to your care team before breastfeeding. Changes to your treatment plan may be needed. What side effects may I notice from receiving this medication? Side effects that you should report to your care team as soon as possible: Allergic reactions--skin rash, itching, hives, swelling of the face, lips, tongue, or throat High acid level--trouble breathing, unusual weakness or fatigue, confusion, headache, fast or irregular heartbeat, nausea, vomiting High ammonia level--unusual weakness or fatigue, confusion, loss of appetite, nausea, vomiting, seizures Fever that does not go away, decrease in sweat Kidney stones--blood in the urine, pain or trouble passing urine, pain in the lower back or sides Redness, blistering, peeling or loosening of the skin, including inside the mouth Sudden eye pain or change in vision such as blurry vision, seeing halos around lights, vision loss Thoughts of suicide or self-harm, worsening mood, feelings of depression Side effects that usually do not require medical attention (report to your care team if they continue or are bothersome): Burning or tingling sensation in hands or feet Difficulty with paying attention, memory, or speech Dizziness Drowsiness Fatigue Loss of appetite with weight loss Slow or sluggish movements of the body This list may not describe all possible side effects. Call your doctor for medical advice about side effects. You may report side effects to FDA at 1-800-FDA-1088. Where should I keep my medication? Keep out of the reach of children and pets. Store between 15 and 30 degrees C (59 and 86 degrees F). Protect from moisture. Keep the container tightly closed. Get rid of any unused medication after the expiration date. To get rid of medications that are no longer needed or have expired: Take the medication to a medication take-back program. Check with your pharmacy or law  enforcement to find a location. If you cannot return the medication, check the label or package insert to see if the medication should be thrown out in the garbage or flushed down the toilet. If you are not sure, ask your care team. If it is safe to put it in the trash, empty the medication out of the container. Mix the medication with cat litter, dirt, coffee grounds, or other unwanted substance. Seal the mixture in a bag or container. Put it in the trash. NOTE: This sheet is a summary. It may not cover all possible information. If you have questions about this medicine, talk to your doctor, pharmacist, or health care provider.  2024 [employer]vier/Gold Standard (2021-06-21 00:00:00)

## 2022-07-17 NOTE — Progress Notes (Unsigned)
[facility]    Provider:  Dr Lucia Gaskins Requesting Provider: Jeanella Flattery Primary Care Provider:  Marcine Matar, MD  CC:  migraines  HPI:  Wendy Maynard is a 47 y.o. female here as requested by Roemhildt, Lorin T, PA-C for migraine. has Cholecystitis; Chronic calculous cholecystitis; MDD (major depressive disorder), recurrent severe, without psychosis (HCC); ADHD, predominantly inattentive type; Posttraumatic stress disorder; Snoring; Excessive sleepiness; Fatigue; Insomnia; Sleep paralysis; Migraine with aura and without status migrainosus, not intractable; Chronic pain of right knee; Morbid obesity with body mass index (BMI) of 40.0 to 44.9 in adult [facility]); OSA on CPAP; Postmenopausal hormone replacement therapy; Neck muscle spasm; Elevated blood pressure reading without diagnosis of hypertension; and Chronic migraine without aura without status migrainosus, not intractable on their problem list.  I reviewed emergency room notes.  Patient recently presented at the end of April for history of migraines especially the prior 2 weeks with intermittent left-sided migraines that seem to be worsening and up to 10 out of 10 in pain with some vomiting.  She had some peripheral vision changes and endorsed auras which is not uncommon for her.  She also endorsed some dizziness, photosensitivity and nausea, used to be on Relpax but stopped it because her migraines improved after hysterectomy.  She has tried Excedrin Migraine.  She did do a virtual visit on 417 for headaches as well and she rated her headache as the worst headache of her life and is was advised to go to the ER and she has been to the ER multiple times in the past for headache.  Toradol, Zofran helped with her headache.  Relpax was sent to pharmacy.  She has a FHX in dad and sister. Started in college. Had a histerectomy at 40 and improved. Would get one a month or none for a few years. Started worsening 2020 1-2x a  month, then progressively worsening.  Patient is here and reports that in February her migraines became more frequent, but they had been progressing since the pandemic.  She had an ER visit and was treated with a migraine cocktail.  Not sure how many migraines but this month increased from a possible 1 a month to a few a week, pain is always on the left side behind the eye sometimes extending all the way through the left side of the head, nausea but do not always vomit, photophobia, phonophobia and a little bit of osmophobia.  In March she documented that she had blurry vision at 130 and at 2 PM the pain started on the left side nausea took Excedrin drink a Coke lasted until 4 PM, exhausted after a trigger was that she did not sleep the night before and she ate sausage biscuit peaches breakfast lunch.  In March the same thing blurry vision, pain, vomiting, exhaustion, took Excedrin, can last a minimum of 4 hours upwards of a day or 2, she had a sandwich for lunch, she also had 1 on March 7 she took Tylenol felt very weak with photophobia and phonophobia, the next migraine similar blurry vision eye twitching, blurry vision appears to be a recurring theme before the migraines, in March she had at least 11 migraine days and in April she had at least 8 migraine days.  Has daily headaches.  She has had over 8 migraine days and daily headaches for more than 6 months.  Sleep helps.  She tries to drink water.  She has tried Tylenol and Excedrin and drinking Cokes and  eating crackers.  Sleep helps, going into a dark room helps, from her journal it appears as though she has more migraines when she eats foods with preservatives like sandwiches or sausage but that is unclear.  She always appears to have nausea, photophobia, phonophobia, pulsating pounding throbbing, unilateral, moderate to severe pain, also has pain in her head and neck radiates to the back of the head and in the lower back and it is always exhausting  afterwards on occasion she has lots of vomiting.  No aura.  No medication overuse.  She has had look at the emergency room where she has been multiple times with migraine cocktails with Toradol and Zofran.  The emergency room gave her Relpax which has worked in the past.  She also sees boxes prior to her migraines which may be in aura however not all the time.  And also she has ocular migraines as well without the pain.No other focal neurologic deficits, associated symptoms, inciting events or modifiable factors.   Reviewed notes, labs and imaging from outside physicians, which showed:  Labs in the past included in February 2024 urine drug screen with positive benzos, amphetamines and THC, ethanol negative, BMP with low potassium 3.2 elevated glucose 177 BUN 15 and creatinine 0.91, CBC showed leukocytosis 18.6, slight critically decreased MCV 78.6 with a predominance of neutrophils.  TSH plus free T4 plus T3 free in October 2023 was normal.  Hemoglobin A1c in February 2023 was 6, prediabetes.  Lipids were very elevated cholesterol was 224, LDL was 140 and triglycerides were 173 in February 2023.  I do not see any brain imaging in epic or "Care Everywhere".  From a thorough review of records, Meds tried that can be used in migraine and headache management include Tylenol, Xanax, Abilify, Flexeril, Voltaren tabs, Benadryl, Cymbalta, Relpax, Prozac, gabapentin, hydroxyzine, ketorolac injections, Mobic tablets, Robaxin, Reglan injections, Zofran oral and injections, oxycodone, Compazine injections, Phenergan injections, propranolol, Seroquel, Zoloft, amitriptyline and nortriptyline contraindicated because she is already on Zoloft and this can cause serotonin syndrome, tramadol, trazodone, imitrex, aimovig contraindicated due to constipation  Review of Systems: Patient complains of symptoms per HPI as well as the following symptoms migraines. Pertinent negatives and positives per HPI. All others  negative.   Social History   Socioeconomic History   Marital status: Single    Spouse name: Not on file   Number of children: 0   Years of education: Not on file   Highest education level: Master's degree (e.g., MA, MS, MEng, MEd, MSW, MBA)  Occupational History   Occupation: Retail buyer at [employer]  Tobacco Use   Smoking status: Every Day    Packs/day: .5    Types: Cigarettes    Last attempt to quit: 01/2021    Years since quitting: 1.5   Smokeless tobacco: Never   Tobacco comments:    plans to start nicotine gum  Vaping Use   Vaping Use: Never used  Substance and Sexual Activity   Alcohol use: Not Currently    Comment: Occasionally 1-2 x/mth   Drug use: No    Types: Marijuana    Comment: Last use 04/27/16   Sexual activity: Not Currently    Birth control/protection: Condom  Other Topics Concern   Not on file  Social History Narrative   Not on file   Social Determinants of Health   Financial Resource Strain: Not on file  Food Insecurity: Not on file  Transportation Needs: Not on file  Physical Activity: Not on file  Stress: Not on file  Social Connections: Not on file  Intimate Partner Violence: Not on file    Family History  Problem Relation Age of Onset   Depression Mother        in response to grief/loss of husband   Hypertension Father        Died sudenly at age 45. Reports father may have had Aspergers   Migraines Father    Depression Sister    Anxiety disorder Sister    ADD / ADHD Sister    Migraines Sister    ADD / ADHD Cousin    Asperger's syndrome Cousin     Past Medical History:  Diagnosis Date   Abdominal pain in female patient 11/2012   Anxiety    Depression    Diabetes mellitus without complication (HCC)    Migraines    Polycystic ovarian disease     Patient Active Problem List   Diagnosis Date Noted   Chronic migraine without aura without status migrainosus, not intractable 07/17/2022   Migraine with aura and without status  migrainosus, not intractable 03/29/2021   Chronic pain of right knee 03/29/2021   Morbid obesity with body mass index (BMI) of 40.0 to 44.9 in adult (HCC) 03/29/2021   OSA on CPAP 03/29/2021   Postmenopausal hormone replacement therapy 03/29/2021   Neck muscle spasm 03/29/2021   Elevated blood pressure reading without diagnosis of hypertension 03/29/2021   Snoring 12/07/2020   Excessive sleepiness 12/07/2020   Fatigue 12/07/2020   Insomnia 12/07/2020   Sleep paralysis 12/07/2020   ADHD, predominantly inattentive type 02/06/2018   Posttraumatic stress disorder 02/06/2018   MDD (major depressive disorder), recurrent severe, without psychosis (HCC) 04/28/2016   Chronic calculous cholecystitis 03/23/2013   Cholecystitis 02/26/2013    Past Surgical History:  Procedure Laterality Date   CHOLECYSTECTOMY N/A 02/26/2013   Procedure: LAPAROSCOPIC CHOLECYSTECTOMY WITH INTRAOPERATIVE CHOLANGIOGRAM;  Surgeon: Adolph Pollack, MD;  Location: [facility];  Service: General;  Laterality: N/A;   HYSTERECTOMY ABDOMINAL WITH SALPINGECTOMY     WISDOM TOOTH EXTRACTION      Current Outpatient Medications  Medication Sig Dispense Refill   Ascorbic Acid (VITAMIN C) 100 MG tablet Take 100 mg by mouth daily.     CALCIUM PO Take by mouth.     CHELATED MAGNESIUM PO Take by mouth.     estradiol (ESTRACE) 1 MG tablet Take 1 tablet (1 mg total) by mouth daily. 30 tablet 4   estradiol (ESTRACE) 1 MG tablet Take 1 tablet (1 mg total) by mouth in the morning. 30 tablet 1   Fremanezumab-vfrm ([name]) 225 MG/1.5ML SOAJ Inject 225 mg into the skin every 30 (thirty) days. 1.5 mL 11   gabapentin (NEURONTIN) 300 MG capsule Take 1 capsule (300 mg total) by mouth 3 (three) times daily. 90 capsule 2   glycopyrrolate (ROBINUL) 1 MG tablet Take 1 tablet (1 mg total) by mouth 2 (two) times daily. (Patient taking differently: Take 1 mg by mouth daily.) 60 tablet 4   glycopyrrolate (ROBINUL) 1 MG tablet Take 1 tablet (1 mg total)  by mouth 2 (two) times daily. 60 tablet 1   hydrOXYzine (VISTARIL) 50 MG capsule Take 1 capsule (50 mg total) by mouth every 8 (eight) hours as needed. 90 capsule 1   lidocaine (LIDODERM) 5 % Place 1 patch onto the skin daily. Remove & Discard patch within 12 hours or as directed by MD 30 patch 0   Multiple Vitamin (MULTIVITAMIN) tablet Take 1 tablet by mouth  daily.     Omega-3 Fatty Acids (FISH OIL ADULT GUMMIES PO) Take by mouth.     ondansetron (ZOFRAN) 4 MG tablet Take 1 tablet (4 mg total) by mouth every 6 (six) hours. 12 tablet 0   QUEtiapine (SEROQUEL) 100 MG tablet Take 1 tablet (100 mg total) by mouth daily at bedtime. 90 tablet 0   sertraline (ZOLOFT) 100 MG tablet Take 2 tablets (200 mg total) by mouth daily. 180 tablet 1   SUMAtriptan (IMITREX) 100 MG tablet Take 1 tablet (100 mg total) by mouth once as needed for up to 1 dose. May repeat in 2 hours if headache persists or recurs. 10 tablet 12   topiramate (TOPAMAX) 50 MG tablet Take 1 tablet by mouth at bedtime. In 1-2 weeks, may increase to 2 tablets at bedtime 60 tablet 11   vitamin B-12 (CYANOCOBALAMIN) 100 MCG tablet Take 100 mcg by mouth daily.     zolpidem (AMBIEN) 10 MG tablet Take 1 tablet (10 mg total) by mouth at bedtime as needed. 15 tablet 2   eletriptan (RELPAX) 20 MG tablet Take 1 tablet (20 mg total) by mouth once for 1 dose. May repeat in 2 hours if headache persists or recurs.  No more than 2 doses in 24 hours. 10 tablet 0   Current Facility-Administered Medications  Medication Dose Route Frequency Provider Last Rate Last Admin   Fremanezumab-vfrm SOSY 225 mg  225 mg Subcutaneous Once Anson Fret, MD        Allergies as of 07/17/2022 - Review Complete 07/17/2022  Allergen Reaction Noted   Ciprofloxacin Hives and Other (See Comments) 02/26/2013   Clindamycin/lincomycin Diarrhea and Other (See Comments) 04/29/2016   Vicodin [hydrocodone-acetaminophen] Itching and Nausea And Vomiting 07/18/2011   Penicillins  Rash and Other (See Comments) 07/18/2011   Sulfa antibiotics Rash 07/18/2011    Vitals: BP (!) 143/92   Pulse 84   Ht 5\' 7"  (1.702 m)   Wt 263 lb 9.6 oz (119.6 kg)   LMP  (LMP Unknown)   BMI 41.29 kg/m  Last Weight:  Wt Readings from Last 1 Encounters:  07/17/22 263 lb 9.6 oz (119.6 kg)   Last Height:   Ht Readings from Last 1 Encounters:  07/17/22 5\' 7"  (1.702 m)     Physical exam: Exam: Gen: NAD, conversant, well nourised, obese, well groomed                     CV: RRR, no MRG. No Carotid Bruits. No peripheral edema, warm, nontender Eyes: Conjunctivae clear without exudates or hemorrhage  Neuro: Detailed Neurologic Exam  Speech:    Speech is normal; fluent and spontaneous with normal comprehension.  Cognition:    The patient is oriented to person, place, and time;     recent and remote memory intact;     language fluent;     normal attention, concentration,     fund of knowledge Cranial Nerves:    The pupils are equal, round, and reactive to light. The fundi are normal and spontaneous venous pulsations are present. Visual fields are full to finger confrontation. Extraocular movements are intact. Trigeminal sensation is intact and the muscles of mastication are normal. The face is symmetric. The palate elevates in the midline. Hearing intact. Voice is normal. Shoulder shrug is normal. The tongue has normal motion without fasciculations.   Coordination:    Normal finger to nose and heel to shin. Normal rapid alternating movements.   Gait:  Heel-toe and tandem gait are normal.   Motor Observation:    No asymmetry, no atrophy, and no involuntary movements noted. Tone:    Normal muscle tone.    Posture:    Posture is normal. normal erect    Strength:    Strength is V/V in the upper and lower limbs.      Sensation: intact to LT     Reflex Exam:  DTR's:    Deep tendon reflexes in the upper and lower extremities are normal bilaterally.   Toes:    The  toes are downgoing bilaterally.   Clonus:    Clonus is absent.    Assessment/Plan:  Patient with chronic migraines  Preventative: Start Topiramate 50mg  then increase to 100mg  at bedtime Preventative: Start [name] samples (emgality?) Acute(as needed): Relpax and try Sumatriptan. If these fail Ubrelvy, Nurtec or Zavzpret Low threshold for MRI brain  w/wo contrast, she declined at this time  Discussed: "There is increased risk for stroke in women with migraine with aura and a contraindication for the combined contraceptive pill for use by women who have migraine with aura. The risk for women with migraine without aura is lower. However other risk factors like smoking are far more likely to increase stroke risk than migraine. There is a recommendation for no smoking and for the use of OCPs without estrogen such as progestogen only pills particularly for women with migraine with aura.Marland Kitchen People who have migraine headaches with auras may be 3 times more likely to have a stroke caused by a blood clot, compared to migraine patients who don't see auras. Women who take hormone-replacement therapy may be 30 percent more likely to suffer a clot-based stroke than women not taking medication containing estrogen. Other risk factors like smoking and high blood pressure may be  much more important."  No orders of the defined types were placed in this encounter.  Meds ordered this encounter  Medications   topiramate (TOPAMAX) 50 MG tablet    Sig: Take 1 tablet by mouth at bedtime. In 1-2 weeks, may increase to 2 tablets at bedtime    Dispense:  60 tablet    Refill:  11   Fremanezumab-vfrm SOSY 225 mg   Fremanezumab-vfrm ([name]) 225 MG/1.5ML SOAJ    Sig: Inject 225 mg into the skin every 30 (thirty) days.    Dispense:  1.5 mL    Refill:  11   SUMAtriptan (IMITREX) 100 MG tablet    Sig: Take 1 tablet (100 mg total) by mouth once as needed for up to 1 dose. May repeat in 2 hours if headache persists or recurs.     Dispense:  10 tablet    Refill:  12    Cc: Roemhildt, Lorin T, PA-C,  Marcine Matar, MD  Naomie Dean, MD  [facility] [address] [place], [place] [address]  Phone [phone] Fax [phone]

## 2022-07-18 ENCOUNTER — Telehealth: Payer: Self-pay | Admitting: *Deleted

## 2022-07-18 ENCOUNTER — Encounter: Payer: Self-pay | Admitting: *Deleted

## 2022-07-18 ENCOUNTER — Encounter: Payer: Self-pay | Admitting: Neurology

## 2022-07-18 NOTE — Telephone Encounter (Signed)
Per Dr. Lucia Gaskins  Patient had to move back in with her mother due to severe medical condition. She has a lease but she canntot live alone. Please write a letter to thsi effect and that medically I recommend she moves in with mother who will care for her and please give all consideration to allowing patient to break her lease.   Can you write something to this effect?     Letter wrote ,signed and sent to Medical records to mail

## 2022-07-19 ENCOUNTER — Other Ambulatory Visit (HOSPITAL_BASED_OUTPATIENT_CLINIC_OR_DEPARTMENT_OTHER): Payer: Self-pay

## 2022-07-22 ENCOUNTER — Other Ambulatory Visit (HOSPITAL_BASED_OUTPATIENT_CLINIC_OR_DEPARTMENT_OTHER): Payer: Self-pay

## 2022-07-23 ENCOUNTER — Ambulatory Visit: Payer: Commercial Managed Care - PPO | Admitting: Psychiatry

## 2022-07-29 ENCOUNTER — Encounter: Payer: Self-pay | Admitting: Neurology

## 2022-07-30 ENCOUNTER — Encounter: Payer: Self-pay | Admitting: *Deleted

## 2022-08-05 DIAGNOSIS — G4733 Obstructive sleep apnea (adult) (pediatric): Secondary | ICD-10-CM | POA: Diagnosis not present

## 2022-08-06 ENCOUNTER — Ambulatory Visit: Payer: Commercial Managed Care - PPO | Admitting: Psychiatry

## 2022-08-08 ENCOUNTER — Other Ambulatory Visit (HOSPITAL_BASED_OUTPATIENT_CLINIC_OR_DEPARTMENT_OTHER): Payer: Self-pay

## 2022-08-09 ENCOUNTER — Other Ambulatory Visit (HOSPITAL_BASED_OUTPATIENT_CLINIC_OR_DEPARTMENT_OTHER): Payer: Self-pay

## 2022-08-20 ENCOUNTER — Other Ambulatory Visit (HOSPITAL_BASED_OUTPATIENT_CLINIC_OR_DEPARTMENT_OTHER): Payer: Self-pay

## 2022-08-22 ENCOUNTER — Other Ambulatory Visit: Payer: Self-pay | Admitting: Oncology

## 2022-08-22 DIAGNOSIS — Z006 Encounter for examination for normal comparison and control in clinical research program: Secondary | ICD-10-CM

## 2022-08-23 ENCOUNTER — Other Ambulatory Visit (HOSPITAL_BASED_OUTPATIENT_CLINIC_OR_DEPARTMENT_OTHER): Payer: Self-pay

## 2022-08-23 ENCOUNTER — Telehealth: Payer: Self-pay | Admitting: Psychiatry

## 2022-08-23 DIAGNOSIS — F332 Major depressive disorder, recurrent severe without psychotic features: Secondary | ICD-10-CM

## 2022-08-23 DIAGNOSIS — F411 Generalized anxiety disorder: Secondary | ICD-10-CM

## 2022-08-23 DIAGNOSIS — F4310 Post-traumatic stress disorder, unspecified: Secondary | ICD-10-CM

## 2022-08-23 MED ORDER — QUETIAPINE FUMARATE 300 MG PO TABS
300.0000 mg | ORAL_TABLET | Freq: Every day | ORAL | 0 refills | Status: DC
  Filled 2022-08-23: qty 30, 30d supply, fill #0

## 2022-08-23 NOTE — Telephone Encounter (Signed)
Patient reporting sadness, increased depression, crying, panic attacks, and not sleeping.  She has had a lot of migraines lately as well. She reports the sleeping issues are both getting to sleep and frequent awakenings.   Meds: Gabapentin 300 mg TID Hydroxyzine 50 mg q8h Seroquel 100 mg at bedtime Sertraline 200 mg  Ambien 10 mg   She has FU 7/29 and appt with [facility] in October. Has not been able to keep therapy appts due to having migraines and needing to save her time off.

## 2022-08-23 NOTE — Telephone Encounter (Signed)
Patient notified of Rx and recommendations.  

## 2022-08-23 NOTE — Telephone Encounter (Signed)
Recommend increasing Seroquel to 200 mg at bedtime for 2 nights, then increasing to 300 mg at bedtime to improve mood, anxiety, and sleep. Script has been sent to her pharmacy for 300 mg tabs.

## 2022-08-23 NOTE — Telephone Encounter (Signed)
Wendy Maynard called at 10:40 to make appt.  Scheduled for 7/29.  She is experiencing sadness and depression, crying a lot and having panic attacks.  Wondering if she needs a medication adjustment.  Please call to discuss

## 2022-08-27 ENCOUNTER — Ambulatory Visit: Payer: Commercial Managed Care - PPO | Admitting: Psychiatry

## 2022-08-27 ENCOUNTER — Other Ambulatory Visit (HOSPITAL_BASED_OUTPATIENT_CLINIC_OR_DEPARTMENT_OTHER): Payer: Self-pay

## 2022-09-02 ENCOUNTER — Telehealth: Payer: Self-pay | Admitting: Psychiatry

## 2022-09-02 NOTE — Telephone Encounter (Signed)
Next visit is 09/09/22. Breahna called and said that while taking her Xanax she is having panic attacks  and flashbacks from dramatic events. Could someone please call her regarding this at [phone].  [facility]   Phone: [phone]  Fax: [phone]

## 2022-09-03 ENCOUNTER — Other Ambulatory Visit (HOSPITAL_BASED_OUTPATIENT_CLINIC_OR_DEPARTMENT_OTHER): Payer: Self-pay

## 2022-09-03 ENCOUNTER — Other Ambulatory Visit: Payer: Self-pay

## 2022-09-03 MED ORDER — ALPRAZOLAM 0.5 MG PO TABS
0.5000 mg | ORAL_TABLET | Freq: Every day | ORAL | 0 refills | Status: DC | PRN
  Filled 2022-09-03: qty 30, 30d supply, fill #0

## 2022-09-03 NOTE — Telephone Encounter (Signed)
Pended Rx.

## 2022-09-04 DIAGNOSIS — G4733 Obstructive sleep apnea (adult) (pediatric): Secondary | ICD-10-CM | POA: Diagnosis not present

## 2022-09-09 ENCOUNTER — Ambulatory Visit: Payer: Commercial Managed Care - PPO | Admitting: Psychiatry

## 2022-09-09 ENCOUNTER — Other Ambulatory Visit (HOSPITAL_BASED_OUTPATIENT_CLINIC_OR_DEPARTMENT_OTHER): Payer: Self-pay

## 2022-09-09 ENCOUNTER — Ambulatory Visit (INDEPENDENT_AMBULATORY_CARE_PROVIDER_SITE_OTHER): Payer: Commercial Managed Care - PPO | Admitting: Psychiatry

## 2022-09-09 ENCOUNTER — Encounter: Payer: Self-pay | Admitting: Psychiatry

## 2022-09-09 DIAGNOSIS — F332 Major depressive disorder, recurrent severe without psychotic features: Secondary | ICD-10-CM

## 2022-09-09 DIAGNOSIS — F410 Panic disorder [episodic paroxysmal anxiety] without agoraphobia: Secondary | ICD-10-CM

## 2022-09-09 DIAGNOSIS — F4310 Post-traumatic stress disorder, unspecified: Secondary | ICD-10-CM

## 2022-09-09 DIAGNOSIS — F411 Generalized anxiety disorder: Secondary | ICD-10-CM

## 2022-09-09 DIAGNOSIS — F5101 Primary insomnia: Secondary | ICD-10-CM | POA: Diagnosis not present

## 2022-09-09 MED ORDER — ZOLPIDEM TARTRATE 10 MG PO TABS
10.0000 mg | ORAL_TABLET | Freq: Every evening | ORAL | 2 refills | Status: AC | PRN
Start: 2022-09-17 — End: ?
  Filled 2022-09-09: qty 15, 15d supply, fill #0
  Filled 2022-10-03: qty 15, 15d supply, fill #1
  Filled 2022-10-23: qty 15, 15d supply, fill #2

## 2022-09-09 MED ORDER — QUETIAPINE FUMARATE 300 MG PO TABS
300.0000 mg | ORAL_TABLET | Freq: Every day | ORAL | 0 refills | Status: AC
Start: 2022-09-09 — End: ?
  Filled 2022-09-09 – 2022-09-19 (×2): qty 90, 90d supply, fill #0

## 2022-09-09 MED ORDER — BUSPIRONE HCL 15 MG PO TABS
ORAL_TABLET | ORAL | 2 refills | Status: AC
Start: 2022-09-09 — End: ?
  Filled 2022-09-09: qty 60, 30d supply, fill #0
  Filled 2022-10-10: qty 60, 30d supply, fill #1

## 2022-09-09 MED ORDER — ALPRAZOLAM 0.5 MG PO TABS
0.5000 mg | ORAL_TABLET | Freq: Two times a day (BID) | ORAL | 2 refills | Status: AC | PRN
Start: 2022-09-21 — End: ?
  Filled 2022-09-09 – 2022-09-21 (×4): qty 60, 30d supply, fill #0
  Filled 2022-10-21 (×2): qty 60, 30d supply, fill #1

## 2022-09-09 NOTE — Progress Notes (Unsigned)
Wendy Maynard [id number] 1975-07-06 47 y.o.  Subjective:   Patient ID:  Wendy Maynard is a 47 y.o. (DOB 05/01/1975) female.  Chief Complaint:  Chief Complaint  Patient presents with   Anxiety    HPI Wendy Maynard presents to the office today for follow-up of depression, anxiety, and insomnia. She reports that she has been journaling often. She reports nightmares and flashbacks. She reports that she has been having intrusive memories of traumatic events- "almost can't stop the thoughts." She reports exaggerated startle response, such as when a coworker knocks loudly at her work space. She reports feeling nervous and anxious. Anxiety is most severe in the morning and she does not want to leave the house. She reports waking up feeling "terrified" after nightmares about traumatic experiences. She reports that she has panic attacks at least daily. She reports that at times she is fearful about leaving home.  She reports that Seroquel has helped some with sleep. She reports that she continues to have difficulty with sleep some nights.   She describes her mood as "flat unless I am scared or panicking." She reports, "it's not so much depressed, but sad." Denies irritability. She reports that her motivation has been low. Energy is "not great." She reports pushing herself to complete self-care. Appetite has been ok. She reports that Topamax has helped with her appetite. Diminished interest and enjoyment in things. Denies SI.   She reports that her focus is off. She reports that supervisor commented today that her productivity is good and that she and coworkers have noticed her focus has not been there. She reports that she is easily distracted in meetings.  She has not been able to wear her cPap and, "I feel like someone has their hand over my face."   She reports that she was dx'd with Continuous migraines by Dr. Lucia Gaskins. She reports improved migraines and continues to have 1-2 migraines a week. She has missed work  due to migraines.   She reports improved relationship with her mother and has moved back in with her mother. She reports that sister has h/o ETOH dependence. Her sister told her she would not have any contact with her for a year after her ER visit.   Reports having had some intensive therapy while she was away at rehab. She has been going to [facility]. She has quit using marijuana. She has cut back on cigarettes. Denies any current ETOH use.    She has been taking Xanax prn about once a day.   Xanax last filled 09/03/22. Ambien last filled 08/20/22 x 3.  Past Psychiatric Medication Trials: Abilify- Helped mood. Wt gain.  Sertraline-Helpful  Lexapro- Felt tired and irritable Prozac Cymbalta- adverse effects Wellbutrin XL- Insomnia Trazodone-ineffective Seroquel Gabapentin Ambien Xanax Lorazepam Hydroxyzine- Ineffective Adderall  Adderall XR- Insomnia, irritability, dry mouth  AIMS    Flowsheet Row Video Visit from 09/03/2021 in [facility]  AIMS Total Score 0      GAD-7    Flowsheet Row Office Visit from 11/27/2021 in [facility] Office Visit from 11/06/2021 in [facility] Office Visit from 10/05/2021 in [facility] Office Visit from 08/10/2021 in [facility] Office Visit from 07/04/2021 in [facility]  Total GAD-7 Score 8 8 7 6 9       PHQ2-9    Flowsheet Row Office  Visit from 11/27/2021 in [facility] Office Visit from 11/06/2021 in [facility] Office Visit from 10/05/2021 in [facility] Office Visit from 08/10/2021 in [facility] Office Visit from 07/04/2021 in [facility]  PHQ-2 Total Score 0 0  0 0 0  PHQ-9 Total Score 1 2 1 2  --      Flowsheet Row ED from 06/06/2022 in [facility] ED from 03/29/2022 in [facility] Emergency Department at [facility] ED from 03/21/2022 in [facility] Emergency Department at [facility]  C-SSRS RISK CATEGORY No Risk No Risk No Risk        Review of Systems:  Review of Systems  Musculoskeletal:  Negative for gait problem.  Neurological:  Positive for headaches.  Psychiatric/Behavioral:         Please refer to HPI    Medications: I have reviewed the patient's current medications.  Current Outpatient Medications  Medication Sig Dispense Refill   ALPRAZolam (XANAX) 0.5 MG tablet Take 1 tablet (0.5 mg total) by mouth daily as needed for anxiety. 30 tablet 0   Ascorbic Acid (VITAMIN C) 100 MG tablet Take 100 mg by mouth daily.     CALCIUM PO Take by mouth.     CHELATED MAGNESIUM PO Take by mouth.     eletriptan (RELPAX) 20 MG tablet Take 1 tablet (20 mg total) by mouth once for 1 dose. May repeat in 2 hours if headache persists or recurs.  No more than 2 doses in 24 hours. 10 tablet 0   estradiol (ESTRACE) 1 MG tablet Take 1 tablet (1 mg total) by mouth daily. 30 tablet 4   estradiol (ESTRACE) 1 MG tablet Take 1 tablet (1 mg total) by mouth in the morning. 30 tablet 1   Fremanezumab-vfrm ([name]) 225 MG/1.5ML SOAJ Inject 225 mg into the skin every 30 (thirty) days. 1.5 mL 11   gabapentin (NEURONTIN) 300 MG capsule Take 1 capsule (300 mg total) by mouth 3 (three) times daily. 90 capsule 2   glycopyrrolate (ROBINUL) 1 MG tablet Take 1 tablet (1 mg total) by mouth 2 (two) times daily. (Patient taking differently: Take 1 mg by mouth daily.) 60 tablet 4   glycopyrrolate (ROBINUL) 1 MG tablet Take 1 tablet (1 mg total) by mouth 2 (two) times daily. 60 tablet 1   hydrOXYzine (VISTARIL) 50 MG capsule Take 1 capsule (50 mg total) by mouth every 8 (eight) hours as needed. 90 capsule 1   lidocaine  (LIDODERM) 5 % Place 1 patch onto the skin daily. Remove & Discard patch within 12 hours or as directed by MD 30 patch 0   Multiple Vitamin (MULTIVITAMIN) tablet Take 1 tablet by mouth daily.     Omega-3 Fatty Acids (FISH OIL ADULT GUMMIES PO) Take by mouth.     ondansetron (ZOFRAN) 4 MG tablet Take 1 tablet (4 mg total) by mouth every 6 (six) hours. 12 tablet 0   QUEtiapine (SEROQUEL) 100 MG tablet Take 1 tablet (100 mg total) by mouth daily at bedtime. 90 tablet 0   QUEtiapine (SEROQUEL) 300 MG tablet Take 1 tablet (300 mg total) by mouth at bedtime. 30 tablet 0   sertraline (ZOLOFT) 100 MG tablet Take 2 tablets (200 mg total) by mouth daily. 180 tablet 1   SUMAtriptan (IMITREX) 100 MG tablet Take 1  tablet (100 mg total) by mouth once as needed for up to 1 dose. May repeat in 2 hours if headache persists or recurs. 10 tablet 12   topiramate (TOPAMAX) 50 MG tablet Take 1 tablet by mouth at bedtime. In 1-2 weeks, may increase to 2 tablets at bedtime 60 tablet 11   vitamin B-12 (CYANOCOBALAMIN) 100 MCG tablet Take 100 mcg by mouth daily.     zolpidem (AMBIEN) 10 MG tablet Take 1 tablet (10 mg total) by mouth at bedtime as needed. 15 tablet 2   Current Facility-Administered Medications  Medication Dose Route Frequency Provider Last Rate Last Admin   Fremanezumab-vfrm SOSY 225 mg  225 mg Subcutaneous Once Anson Fret, MD        Medication Side Effects: None  Allergies:  Allergies  Allergen Reactions   Ciprofloxacin Hives and Other (See Comments)    Started in arm as soon as drug started.   Clindamycin/Lincomycin Diarrhea and Other (See Comments)    Causes excessive diarrhea   Vicodin [Hydrocodone-Acetaminophen] Itching and Nausea And Vomiting   Penicillins Rash and Other (See Comments)    Occurred at 47 years old Has patient had a PCN reaction causing immediate rash, facial/tongue/throat swelling, SOB or lightheadedness with hypotension: yes Has patient had a PCN reaction causing  severe rash involving mucus membranes or skin necrosis: no Has patient had a PCN reaction that required hospitalization no Has patient had a PCN reaction occurring within the last 10 years:no If all of the above answers are "NO", then may proceed with Cephalosporin use.    Sulfa Antibiotics Rash    Past Medical History:  Diagnosis Date   Abdominal pain in female patient 11/2012   Anxiety    Depression    Diabetes mellitus without complication (HCC)    Migraines    Polycystic ovarian disease     Past Medical History, Surgical history, Social history, and Family history were reviewed and updated as appropriate.   Please see review of systems for further details on the patient's review from today.   Objective:   Physical Exam:  LMP  (LMP Unknown)   Physical Exam  Lab Review:     Component Value Date/Time   NA 139 03/29/2022 2347   NA 141 03/29/2021 1514   K 3.2 (L) 03/29/2022 2347   CL 99 03/29/2022 2347   CO2 27 03/29/2022 2347   GLUCOSE 177 (H) 03/29/2022 2347   BUN 15 03/29/2022 2347   BUN 13 03/29/2021 1514   CREATININE 0.91 03/29/2022 2347   CALCIUM 8.9 03/29/2022 2347   PROT 7.3 03/29/2021 1514   ALBUMIN 4.9 (H) 03/29/2021 1514   AST 15 03/29/2021 1514   ALT 14 03/29/2021 1514   ALKPHOS 114 03/29/2021 1514   BILITOT <0.2 03/29/2021 1514   GFRNONAA >60 03/29/2022 2347   GFRAA >60 04/28/2016 1836       Component Value Date/Time   WBC 18.6 (H) 03/29/2022 2347   RBC 5.29 (H) 03/29/2022 2347   HGB 12.8 03/29/2022 2347   HGB 12.1 03/29/2021 1514   HCT 41.6 03/29/2022 2347   HCT 37.9 03/29/2021 1514   PLT 258 03/29/2022 2347   PLT 263 03/29/2021 1514   MCV 78.6 (L) 03/29/2022 2347   MCV 74 (L) 03/29/2021 1514   MCH 24.2 (L) 03/29/2022 2347   MCHC 30.8 03/29/2022 2347   RDW 14.9 03/29/2022 2347   RDW 15.4 03/29/2021 1514   LYMPHSABS 1.6 03/29/2022 2347   MONOABS 0.6 03/29/2022 2347  EOSABS 0.2 03/29/2022 2347   BASOSABS 0.1 03/29/2022 2347    No  results found for: "POCLITH", "LITHIUM"   No results found for: "PHENYTOIN", "PHENOBARB", "VALPROATE", "CBMZ"   .res Assessment: Plan:    There are no diagnoses linked to this encounter.   Please see After Visit Summary for patient specific instructions.  Future Appointments  Date Time Provider Department Center  11/13/2022  5:00 PM Stevphen Meuse, [facility] None  12/03/2022  3:00 PM Anson Fret, MD [facility] None    No orders of the defined types were placed in this encounter.   -------------------------------

## 2022-09-19 ENCOUNTER — Other Ambulatory Visit (HOSPITAL_BASED_OUTPATIENT_CLINIC_OR_DEPARTMENT_OTHER): Payer: Self-pay

## 2022-09-21 ENCOUNTER — Other Ambulatory Visit (HOSPITAL_BASED_OUTPATIENT_CLINIC_OR_DEPARTMENT_OTHER): Payer: Self-pay

## 2022-09-23 ENCOUNTER — Other Ambulatory Visit (HOSPITAL_BASED_OUTPATIENT_CLINIC_OR_DEPARTMENT_OTHER): Payer: Self-pay

## 2022-09-24 ENCOUNTER — Ambulatory Visit: Payer: Commercial Managed Care - PPO | Admitting: Psychiatry

## 2022-10-03 ENCOUNTER — Other Ambulatory Visit: Payer: Self-pay

## 2022-10-05 DIAGNOSIS — G4733 Obstructive sleep apnea (adult) (pediatric): Secondary | ICD-10-CM | POA: Diagnosis not present

## 2022-10-08 ENCOUNTER — Ambulatory Visit: Payer: Commercial Managed Care - PPO | Admitting: Psychiatry

## 2022-10-10 ENCOUNTER — Other Ambulatory Visit (HOSPITAL_BASED_OUTPATIENT_CLINIC_OR_DEPARTMENT_OTHER): Payer: Self-pay

## 2022-10-10 ENCOUNTER — Other Ambulatory Visit: Payer: Self-pay

## 2022-10-21 ENCOUNTER — Other Ambulatory Visit: Payer: Self-pay

## 2022-10-21 ENCOUNTER — Other Ambulatory Visit (HOSPITAL_BASED_OUTPATIENT_CLINIC_OR_DEPARTMENT_OTHER): Payer: Self-pay

## 2022-10-23 ENCOUNTER — Encounter: Payer: Self-pay | Admitting: Neurology

## 2022-10-24 ENCOUNTER — Other Ambulatory Visit: Payer: Self-pay

## 2022-11-13 ENCOUNTER — Telehealth: Payer: Self-pay | Admitting: Psychiatry

## 2022-11-13 ENCOUNTER — Ambulatory Visit (INDEPENDENT_AMBULATORY_CARE_PROVIDER_SITE_OTHER): Payer: Commercial Managed Care - PPO | Admitting: Psychiatry

## 2022-11-13 DIAGNOSIS — F4310 Post-traumatic stress disorder, unspecified: Secondary | ICD-10-CM | POA: Diagnosis not present
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
## 2022-11-13 NOTE — Telephone Encounter (Signed)
I called patient again and left a message about her appt tomorrow in case she wasn't aware.

## 2022-11-13 NOTE — Telephone Encounter (Signed)
I tried to call patient and did not get an answer. She has an appt with Shanda Bumps tomorrow so I did not leave a message.

## 2022-11-13 NOTE — Telephone Encounter (Signed)
Patient came into office for appt with [facility] and asked if she could go back to Ambien 30mg . She sleeps better on the 30mg . Ph: [phone] Appt 10/3

## 2022-11-14 ENCOUNTER — Other Ambulatory Visit: Payer: Self-pay

## 2022-11-14 ENCOUNTER — Encounter: Payer: Self-pay | Admitting: Psychiatry

## 2022-11-14 ENCOUNTER — Ambulatory Visit (INDEPENDENT_AMBULATORY_CARE_PROVIDER_SITE_OTHER): Payer: Commercial Managed Care - PPO | Admitting: Psychiatry

## 2022-11-14 ENCOUNTER — Other Ambulatory Visit (HOSPITAL_BASED_OUTPATIENT_CLINIC_OR_DEPARTMENT_OTHER): Payer: Self-pay

## 2022-11-14 VITALS — BP 120/83 | HR 85

## 2022-11-14 DIAGNOSIS — F4310 Post-traumatic stress disorder, unspecified: Secondary | ICD-10-CM

## 2022-11-14 DIAGNOSIS — F332 Major depressive disorder, recurrent severe without psychotic features: Secondary | ICD-10-CM

## 2022-11-14 DIAGNOSIS — F5101 Primary insomnia: Secondary | ICD-10-CM | POA: Diagnosis not present

## 2022-11-14 DIAGNOSIS — F3342 Major depressive disorder, recurrent, in full remission: Secondary | ICD-10-CM | POA: Diagnosis not present

## 2022-11-14 DIAGNOSIS — F411 Generalized anxiety disorder: Secondary | ICD-10-CM | POA: Diagnosis not present

## 2022-11-14 DIAGNOSIS — F410 Panic disorder [episodic paroxysmal anxiety] without agoraphobia: Secondary | ICD-10-CM | POA: Diagnosis not present

## 2022-11-14 MED ORDER — QUETIAPINE FUMARATE 300 MG PO TABS
300.0000 mg | ORAL_TABLET | Freq: Every day | ORAL | 0 refills | Status: DC
Start: 2022-11-14 — End: 2023-02-14
  Filled 2022-11-14 – 2022-12-03 (×2): qty 90, 90d supply, fill #0

## 2022-11-14 MED ORDER — SERTRALINE HCL 100 MG PO TABS
200.0000 mg | ORAL_TABLET | Freq: Every day | ORAL | 1 refills | Status: DC
  Filled 2022-11-14 – 2023-01-16 (×2): qty 180, 90d supply, fill #0

## 2022-11-14 MED ORDER — ZOLPIDEM TARTRATE 10 MG PO TABS
10.0000 mg | ORAL_TABLET | Freq: Every evening | ORAL | 2 refills | Status: DC | PRN
Start: 2022-11-14 — End: 2023-02-07
  Filled 2022-11-14: qty 30, 30d supply, fill #0
  Filled 2022-12-10 – 2022-12-12 (×2): qty 30, 30d supply, fill #1
  Filled 2023-01-06 – 2023-01-10 (×2): qty 30, 30d supply, fill #2

## 2022-11-14 MED ORDER — GABAPENTIN 300 MG PO CAPS
300.0000 mg | ORAL_CAPSULE | Freq: Three times a day (TID) | ORAL | 2 refills | Status: DC
Start: 2022-11-18 — End: 2023-02-18
  Filled 2022-11-18: qty 90, 30d supply, fill #0
  Filled 2023-02-14: qty 90, 30d supply, fill #1

## 2022-11-14 MED ORDER — ALPRAZOLAM 0.5 MG PO TABS
0.5000 mg | ORAL_TABLET | Freq: Two times a day (BID) | ORAL | 2 refills | Status: DC | PRN
Start: 2022-12-16 — End: 2023-02-17
  Filled 2022-11-14 – 2022-11-20 (×2): qty 60, 30d supply, fill #0
  Filled 2022-12-20: qty 60, 30d supply, fill #1
  Filled 2023-01-16: qty 60, 30d supply, fill #2

## 2022-11-14 MED ORDER — BUSPIRONE HCL 15 MG PO TABS
15.0000 mg | ORAL_TABLET | Freq: Two times a day (BID) | ORAL | 1 refills | Status: DC
Start: 2022-11-14 — End: 2023-02-18
  Filled 2022-11-14: qty 180, 90d supply, fill #0

## 2022-11-14 NOTE — Progress Notes (Signed)
Wendy Maynard [id number] [birth year] 47 y.o.  Virtual Visit via Telephone Note  I connected with pt on 11/14/22 at  9:30 AM EDT by telephone and verified that I am speaking with the correct person using two identifiers.   I discussed the limitations, risks, security and privacy concerns of performing an evaluation and management service by telephone and the availability of in person appointments. I also discussed with the patient that there may be a patient responsible charge related to this service. The patient expressed understanding and agreed to proceed.   I discussed the assessment and treatment plan with the patient. The patient was provided an opportunity to ask questions and all were answered. The patient agreed with the plan and demonstrated an understanding of the instructions.   The patient was advised to call back or seek an in-person evaluation if the symptoms worsen or if the condition fails to improve as anticipated.  I provided 32 minutes of non-face-to-face time during this encounter.  The patient was located at work.  The provider was located at [facility].   Corie Chiquito, PMHNP   Subjective:   Patient ID:  Wendy Maynard is a 47 y.o. (DOB [birth year]) female.  Chief Complaint:  Chief Complaint  Patient presents with   Anxiety   Sleeping Problem    HPI Wendy Maynard presents for follow-up of anxiety, depression, ADHD, and insomnia.  She reports, "I'm doing well."  She reports poor sleep. She reports that she can sleep 8-9 hours when she takes Seroquel and Ambien. Sleeping only 3-4 hours if she does not take both medications. She reports that she has not been able to tolerate cPap with recovering from COVID. She reports that she has had "some" nightmares on occasion. She reports that she occasionally awakens sweating and feeling scared.   She reports several recent triggers for anxiety with recent natural disaster and world news. She started seeing trauma  therapist yesterday. "Sometimes I get so anxious, I'm just shaking." She reports, "work hasn't stressed me, home hasn't stressed me,but I am just stressed back." She reports flashbacks on occasions and intrusive memories. She reports exaggerated startle response has improved some. She reports hypervigilance. She reports having some panic attacks when she gets very hot or has an intrusive memories.   Denies recent depression and feels that Seroquel has been helpful for depression. She reports that strong emotions are more manageable since starting Seroquel. Energy and motivation have been "ok." She reports energy is lower on days that she has not had adequate sleep. She reports concentration is adequate overall. She reports that she has not been snacking often. She reports that she is not losing weight. She reports excessive appetite has resolved. Denies SI.  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   She has been going to [employer] at her church. She reports that she has become more involved in her church. She has been making friends at church and "starting to socialize again."   She reports that she has to take Xanax prn some mornings when she has anxiety about leaving the house. She reports difficulty transitioning from home to work and vice versa.   Plans to start keeping a journal.   Ambien last filled 10/24/22. Gabapentin last filled 10/21/22. Alprazolam last filled 10/21/22 x 2  Past Psychiatric Medication Trials: Abilify- Helped mood. Wt gain.  Sertraline-Helpful  Lexapro- Felt tired and irritable Prozac Cymbalta- adverse effects Wellbutrin XL- Insomnia Trazodone-ineffective Seroquel Gabapentin  Ambien Xanax Lorazepam Hydroxyzine- Ineffective Adderall  Adderall XR- Insomnia, irritability, dry mouth Topamax- Double vision  Review of Systems:  Review of Systems  Musculoskeletal:  Negative for gait problem.   Neurological:        Improved migraines with break in intense heat. She reports chronic tremor in the morning upon awakening that predates Seroquel  Psychiatric/Behavioral:         Please refer to HPI    Medications: I have reviewed the patient's current medications.  Current Outpatient Medications  Medication Sig Dispense Refill   Ascorbic Acid (VITAMIN C) 100 MG tablet Take 100 mg by mouth daily.     estradiol (ESTRACE) 1 MG tablet Take 1 tablet (1 mg total) by mouth in the morning. 30 tablet 1   Fremanezumab-vfrm ([name]) 225 MG/1.5ML SOAJ Inject 225 mg into the skin every 30 (thirty) days. 1.5 mL 11   lidocaine (LIDODERM) 5 % Place 1 patch onto the skin daily. Remove & Discard patch within 12 hours or as directed by MD 30 patch 0   Multiple Vitamin (MULTIVITAMIN) tablet Take 1 tablet by mouth daily.     Omega-3 Fatty Acids (FISH OIL ADULT GUMMIES PO) Take by mouth.     SUMAtriptan (IMITREX) 100 MG tablet Take 1 tablet (100 mg total) by mouth once as needed for up to 1 dose. May repeat in 2 hours if headache persists or recurs. 10 tablet 12   [START ON 12/16/2022] ALPRAZolam (XANAX) 0.5 MG tablet Take 1 tablet (0.5 mg total) by mouth 2 (two) times daily as needed for anxiety. 60 tablet 2   busPIRone (BUSPAR) 15 MG tablet Take 1 tablet (15 mg total) by mouth 2 (two) times daily. 180 tablet 1   CALCIUM PO Take by mouth. (Patient not taking: Reported on 11/14/2022)     CHELATED MAGNESIUM PO Take by mouth. (Patient not taking: Reported on 11/14/2022)     eletriptan (RELPAX) 20 MG tablet Take 1 tablet (20 mg total) by mouth once for 1 dose. May repeat in 2 hours if headache persists or recurs.  No more than 2 doses in 24 hours. (Patient not taking: Reported on 11/14/2022) 10 tablet 0   estradiol (ESTRACE) 1 MG tablet Take 1 tablet (1 mg total) by mouth daily. 30 tablet 4   [START ON 11/18/2022] gabapentin (NEURONTIN) 300 MG capsule Take 1 capsule (300 mg total) by mouth 3 (three) times daily. 90  capsule 2   glycopyrrolate (ROBINUL) 1 MG tablet Take 1 tablet (1 mg total) by mouth 2 (two) times daily. (Patient not taking: Reported on 11/14/2022) 60 tablet 4   glycopyrrolate (ROBINUL) 1 MG tablet Take 1 tablet (1 mg total) by mouth 2 (two) times daily. (Patient not taking: Reported on 09/09/2022) 60 tablet 1   ondansetron (ZOFRAN) 4 MG tablet Take 1 tablet (4 mg total) by mouth every 6 (six) hours. (Patient not taking: Reported on 09/09/2022) 12 tablet 0   QUEtiapine (SEROQUEL) 300 MG tablet Take 1 tablet (300 mg total) by mouth at bedtime. 90 tablet 0   sertraline (ZOLOFT) 100 MG tablet Take 2 tablets (200 mg total) by mouth daily. 180 tablet 1   topiramate (TOPAMAX) 50 MG tablet Take 1 tablet by mouth at bedtime. In 1-2 weeks, may increase to 2 tablets at bedtime (Patient not taking: Reported on 11/14/2022) 60 tablet 11   vitamin B-12 (CYANOCOBALAMIN) 100 MCG tablet Take 100 mcg by mouth daily. (Patient not taking: Reported on 11/14/2022)     zolpidem (  AMBIEN) 10 MG tablet Take 1 tablet (10 mg total) by mouth at bedtime as needed. 30 tablet 2   Current Facility-Administered Medications  Medication Dose Route Frequency Provider Last Rate Last Admin   Fremanezumab-vfrm SOSY 225 mg  225 mg Subcutaneous Once Anson Fret, MD        Medication Side Effects: None  Allergies:  Allergies  Allergen Reactions   Ciprofloxacin Hives and Other (See Comments)    Started in arm as soon as drug started.   Clindamycin/Lincomycin Diarrhea and Other (See Comments)    Causes excessive diarrhea   Vicodin [Hydrocodone-Acetaminophen] Itching and Nausea And Vomiting   Penicillins Rash and Other (See Comments)    Occurred at 47 years old Has patient had a PCN reaction causing immediate rash, facial/tongue/throat swelling, SOB or lightheadedness with hypotension: yes Has patient had a PCN reaction causing severe rash involving mucus membranes or skin necrosis: no Has patient had a PCN reaction that  required hospitalization no Has patient had a PCN reaction occurring within the last 10 years:no If all of the above answers are "NO", then may proceed with Cephalosporin use.    Sulfa Antibiotics Rash    Past Medical History:  Diagnosis Date   Abdominal pain in female patient 11/2012   Anxiety    Depression    Diabetes mellitus without complication (HCC)    Migraines    Polycystic ovarian disease     Family History  Problem Relation Age of Onset   Depression Mother        in response to grief/loss of husband   Hypertension Father        Died sudenly at age 41. Reports father may have had Aspergers   Migraines Father    Depression Sister    Anxiety disorder Sister    ADD / ADHD Sister    Migraines Sister    ADD / ADHD Cousin    Asperger's syndrome Cousin     Social History   Socioeconomic History   Marital status: Single    Spouse name: Not on file   Number of children: 0   Years of education: Not on file   Highest education level: Master's degree (e.g., MA, MS, MEng, MEd, MSW, MBA)  Occupational History   Occupation: Retail buyer at [employer]  Tobacco Use   Smoking status: Every Day    Current packs/day: 0.00    Types: Cigarettes    Last attempt to quit: 01/2021    Years since quitting: 1.8   Smokeless tobacco: Never   Tobacco comments:    plans to start nicotine gum  Vaping Use   Vaping status: Never Used  Substance and Sexual Activity   Alcohol use: Not Currently    Comment: Occasionally 1-2 x/mth   Drug use: No    Types: Marijuana    Comment: Last use 04/27/16   Sexual activity: Not Currently    Birth control/protection: Condom  Other Topics Concern   Not on file  Social History Narrative   Not on file   Social Determinants of Health   Financial Resource Strain: Not on file  Food Insecurity: Not on file  Transportation Needs: Not on file  Physical Activity: Not on file  Stress: Not on file  Social Connections: Not on file  Intimate Partner  Violence: Not on file    Past Medical History, Surgical history, Social history, and Family history were reviewed and updated as appropriate.   Please see review of systems for further  details on the patient's review from today.   Objective:   Physical Exam:  BP 120/83   Pulse 85   LMP  (LMP Unknown)   Physical Exam Neurological:     Mental Status: She is alert and oriented to person, place, and time.     Cranial Nerves: No dysarthria.  Psychiatric:        Attention and Perception: Attention and perception normal.        Mood and Affect: Mood is anxious. Mood is not depressed.        Speech: Speech normal.        Behavior: Behavior is cooperative.        Thought Content: Thought content normal. Thought content is not paranoid or delusional. Thought content does not include homicidal or suicidal ideation. Thought content does not include homicidal or suicidal plan.        Cognition and Memory: Cognition and memory normal.        Judgment: Judgment normal.     Comments: Insight intact     Lab Review:     Component Value Date/Time   NA 139 03/29/2022 2347   NA 141 03/29/2021 1514   K 3.2 (L) 03/29/2022 2347   CL 99 03/29/2022 2347   CO2 27 03/29/2022 2347   GLUCOSE 177 (H) 03/29/2022 2347   BUN 15 03/29/2022 2347   BUN 13 03/29/2021 1514   CREATININE 0.91 03/29/2022 2347   CALCIUM 8.9 03/29/2022 2347   PROT 7.3 03/29/2021 1514   ALBUMIN 4.9 (H) 03/29/2021 1514   AST 15 03/29/2021 1514   ALT 14 03/29/2021 1514   ALKPHOS 114 03/29/2021 1514   BILITOT <0.2 03/29/2021 1514   GFRNONAA >60 03/29/2022 2347   GFRAA >60 04/28/2016 1836       Component Value Date/Time   WBC 18.6 (H) 03/29/2022 2347   RBC 5.29 (H) 03/29/2022 2347   HGB 12.8 03/29/2022 2347   HGB 12.1 03/29/2021 1514   HCT 41.6 03/29/2022 2347   HCT 37.9 03/29/2021 1514   PLT 258 03/29/2022 2347   PLT 263 03/29/2021 1514   MCV 78.6 (L) 03/29/2022 2347   MCV 74 (L) 03/29/2021 1514   MCH 24.2 (L)  03/29/2022 2347   MCHC 30.8 03/29/2022 2347   RDW 14.9 03/29/2022 2347   RDW 15.4 03/29/2021 1514   LYMPHSABS 1.6 03/29/2022 2347   MONOABS 0.6 03/29/2022 2347   EOSABS 0.2 03/29/2022 2347   BASOSABS 0.1 03/29/2022 2347    No results found for: "POCLITH", "LITHIUM"   No results found for: "PHENYTOIN", "PHENOBARB", "VALPROATE", "CBMZ"   .res Assessment: Plan:    Will increase Ambien qty so that pt is able to take medication nightly since she is sleeping well the nights she takes Ambien and sleep is poor the nights she is unable to take Ambien.  Continue Sertraline 200 mg daily for depression and anxiety.  Continue Seroquel 300 mg at bedtime for mood, anxiety, and insomnia.  Continue Buspar 15 mg po BID for anxiety.  Continue Gabapentin 300 mg TID for anxiety.  Continue Alprazolam prn anxiety.  Recommend continuing therapy with Stevphen Meuse, [facility].  Patient advised to contact office with any questions, adverse effects, or acute worsening in signs and symptoms. Pt to follow-up in 3 months or sooner if clinically indicated.     Deandra was seen today for anxiety and sleeping problem.  Diagnoses and all orders for this visit:  Posttraumatic stress disorder -     gabapentin (NEURONTIN)  300 MG capsule; Take 1 capsule (300 mg total) by mouth 3 (three) times daily. -     QUEtiapine (SEROQUEL) 300 MG tablet; Take 1 tablet (300 mg total) by mouth at bedtime. -     sertraline (ZOLOFT) 100 MG tablet; Take 2 tablets (200 mg total) by mouth daily.  Primary insomnia -     zolpidem (AMBIEN) 10 MG tablet; Take 1 tablet (10 mg total) by mouth at bedtime as needed.  Generalized anxiety disorder -     busPIRone (BUSPAR) 15 MG tablet; Take 1 tablet (15 mg total) by mouth 2 (two) times daily. -     QUEtiapine (SEROQUEL) 300 MG tablet; Take 1 tablet (300 mg total) by mouth at bedtime.  Panic disorder -     ALPRAZolam (XANAX) 0.5 MG tablet; Take 1 tablet (0.5 mg total) by mouth 2 (two) times daily  as needed for anxiety.  Recurrent major depressive disorder, in full remission (HCC) -     QUEtiapine (SEROQUEL) 300 MG tablet; Take 1 tablet (300 mg total) by mouth at bedtime. -     sertraline (ZOLOFT) 100 MG tablet; Take 2 tablets (200 mg total) by mouth daily.    Please see After Visit Summary for patient specific instructions.  Future Appointments  Date Time Provider Department Center  12/03/2022  3:00 PM Anson Fret, MD [facility] None  01/02/2023  5:00 PM Stevphen Meuse, [facility] None  01/15/2023  5:00 PM Stevphen Meuse, [facility] CP-CP None  03/12/2023  5:00 PM Stevphen Meuse, [facility] CP-CP None  03/27/2023  5:00 PM Stevphen Meuse, [facility] CP-CP None  04/09/2023  5:00 PM Stevphen Meuse, [facility] CP[facility]CP None    No orders of the defined types were placed in this encounter.     -------------------------------

## 2022-11-18 ENCOUNTER — Other Ambulatory Visit (HOSPITAL_BASED_OUTPATIENT_CLINIC_OR_DEPARTMENT_OTHER): Payer: Self-pay

## 2022-11-20 ENCOUNTER — Other Ambulatory Visit (HOSPITAL_BASED_OUTPATIENT_CLINIC_OR_DEPARTMENT_OTHER): Payer: Self-pay

## 2022-11-20 ENCOUNTER — Other Ambulatory Visit (HOSPITAL_COMMUNITY): Payer: Self-pay

## 2022-11-20 ENCOUNTER — Other Ambulatory Visit: Payer: Self-pay

## 2022-11-20 ENCOUNTER — Telehealth: Payer: Self-pay | Admitting: Psychiatry

## 2022-11-20 NOTE — Telephone Encounter (Signed)
Called pharmacy to double check that pt has rf on file for October, she does. Notified pt.

## 2022-11-20 NOTE — Telephone Encounter (Signed)
Wendy Maynard called at 9:10 about her alpraxolam prescription.  It is dated at 12/16/22.  Was that a mistake.  She doesn't have a prescription for October.  Please call her to let her know what to do about medication for October.

## 2022-12-03 ENCOUNTER — Other Ambulatory Visit (HOSPITAL_BASED_OUTPATIENT_CLINIC_OR_DEPARTMENT_OTHER): Payer: Self-pay

## 2022-12-03 ENCOUNTER — Other Ambulatory Visit: Payer: Self-pay

## 2022-12-03 ENCOUNTER — Telehealth: Payer: Commercial Managed Care - PPO | Admitting: Neurology

## 2022-12-04 ENCOUNTER — Other Ambulatory Visit: Payer: Self-pay | Admitting: Internal Medicine

## 2022-12-04 ENCOUNTER — Other Ambulatory Visit (HOSPITAL_BASED_OUTPATIENT_CLINIC_OR_DEPARTMENT_OTHER): Payer: Self-pay

## 2022-12-06 NOTE — Telephone Encounter (Signed)
Requested medications are due for refill today.  yes  Requested medications are on the active medications list.  yes  Last refill. 05/01/2022 #30 1 rf  Future visit scheduled.   no  Notes to clinic.  Please review for refill. Per request pt is missing mammogram.    Requested Prescriptions  Pending Prescriptions Disp Refills   estradiol (ESTRACE) 1 MG tablet 30 tablet 1    Sig: Take 1 tablet (1 mg total) by mouth in the morning.     OB/GYN:  Estrogens Failed - 12/04/2022  1:45 PM      Failed - Mammogram is up-to-date per Health Maintenance      Failed - Valid encounter within last 12 months    Recent Outpatient Visits           9 months ago Migraine with aura and without status migrainosus, not intractable   [facility] Jonah Blue B, MD   1 year ago Class 2 severe obesity due to excess calories with serious comorbidity and body mass index (BMI) of 38.0 to 38.9 in adult [facility])   [facility] Marcine Matar, MD   1 year ago Contusion of rib on right side, initial encounter   [facility] Marcine Matar, MD   1 year ago Annual physical exam   [place] [facility] Jonah Blue B, MD   1 year ago Morbid obesity with body mass index (BMI) of 40.0 to 44.9 in adult [facility])   [facility] Marcine Matar, MD       Future Appointments             In 5 months Anson Fret, MD [facility]            Passed - Last BP in normal range    BP Readings from Last 1 Encounters:  07/17/22 (!) 143/92

## 2022-12-10 ENCOUNTER — Other Ambulatory Visit: Payer: Self-pay

## 2022-12-11 ENCOUNTER — Other Ambulatory Visit: Payer: Self-pay

## 2022-12-11 ENCOUNTER — Other Ambulatory Visit (HOSPITAL_BASED_OUTPATIENT_CLINIC_OR_DEPARTMENT_OTHER): Payer: Self-pay

## 2022-12-12 ENCOUNTER — Other Ambulatory Visit (HOSPITAL_BASED_OUTPATIENT_CLINIC_OR_DEPARTMENT_OTHER): Payer: Self-pay

## 2022-12-12 ENCOUNTER — Other Ambulatory Visit (HOSPITAL_COMMUNITY): Payer: Self-pay

## 2022-12-12 ENCOUNTER — Other Ambulatory Visit: Payer: Self-pay

## 2022-12-13 DIAGNOSIS — G4733 Obstructive sleep apnea (adult) (pediatric): Secondary | ICD-10-CM | POA: Diagnosis not present

## 2022-12-20 ENCOUNTER — Other Ambulatory Visit (HOSPITAL_COMMUNITY): Payer: Self-pay

## 2022-12-20 ENCOUNTER — Other Ambulatory Visit: Payer: Self-pay

## 2022-12-20 ENCOUNTER — Other Ambulatory Visit (HOSPITAL_BASED_OUTPATIENT_CLINIC_OR_DEPARTMENT_OTHER): Payer: Self-pay

## 2022-12-25 ENCOUNTER — Encounter: Payer: Self-pay | Admitting: Psychiatry

## 2023-01-02 ENCOUNTER — Ambulatory Visit: Payer: Commercial Managed Care - PPO | Admitting: Psychiatry

## 2023-01-02 DIAGNOSIS — F4310 Post-traumatic stress disorder, unspecified: Secondary | ICD-10-CM | POA: Diagnosis not present

## 2023-01-02 NOTE — Progress Notes (Signed)
      [facility] Counselor/Therapist Progress Note  Patient ID: Wendy Maynard, MRN: [id number],    Date: 01/02/2023  Time Spent: 50 minutes start time 5:02 PM end time 5:52 PM  Treatment Type: Individual Therapy  Reported Symptoms: anxiety, disconnected, panic, triggered responses, flashbacks, intrusive thoughts, sleep issues, fatigue, sadness, rumination  Mental Status Exam:  Appearance:   Casual and Neat     Behavior:  Appropriate  Motor:  Normal  Speech/Language:   Normal Rate  Affect:  Appropriate tearful  Mood:  anxious and sad  Thought process:  normal  Thought content:    WNL  Sensory/Perceptual disturbances:    WNL  Orientation:  oriented to person, place, time/date, and situation  Attention:  Good  Concentration:  Good  Memory:  WNL  Fund of knowledge:   Good  Insight:    Good  Judgment:   Good  Impulse Control:  Good   Risk Assessment: Danger to Self:  No Self-injurious Behavior: No Danger to Others: No Duty to Warn:no Physical Aggression / Violence:No  Access to Firearms a concern: No  Gang Involvement:No   Subjective: Patient was present for session. She shared she has started going to a new church and that is a good thing. She is doing breathing exercises and stretching at night and that is helping. In the last 6 weeks she has been to several outings which is progress.  Patient went on to explain she did not feel that she could jump into very deep trauma related issues at this time due to not feeling like herself.  Allowed her just to process the feeling of being stuck, suds level 6, negative cognition "I am stuck" felt sadness in her shoulders and eyes.  Patient was able to reduce suds level and to be able to see how brain spotting could be very helpful for her.  Discussed different ways that she could use some self spotting to help calm herself when she gets very triggered.  Also encouraged patient to continue journaling and working on recognizing when she gets  triggered so she can talk herself through that.  Patient is also to continue to try and keep her brain engaged in positive activity.  Interventions: Solution-Oriented/Positive Psychology, Insight-Oriented, and BS P  Diagnosis:   ICD-10-CM   1. Posttraumatic stress disorder  F43.10       Plan:  Patient is to work on using coping skills more regularly to try and stay grounded.  Patient is to practice self spotting exercise to calm herself.  Patient is to keep herself engaged in positive activities to decrease rumination on triggered responses.  Patient is to work on journaling to release negative emotions appropriately.  Patient is to work on using theta music to go to sleep at night.  Patient is to take medication as directed.   Stevphen Meuse, [facility]

## 2023-01-06 ENCOUNTER — Other Ambulatory Visit: Payer: Self-pay

## 2023-01-06 ENCOUNTER — Other Ambulatory Visit (HOSPITAL_BASED_OUTPATIENT_CLINIC_OR_DEPARTMENT_OTHER): Payer: Self-pay

## 2023-01-10 ENCOUNTER — Other Ambulatory Visit (HOSPITAL_BASED_OUTPATIENT_CLINIC_OR_DEPARTMENT_OTHER): Payer: Self-pay

## 2023-01-12 DIAGNOSIS — G4733 Obstructive sleep apnea (adult) (pediatric): Secondary | ICD-10-CM | POA: Diagnosis not present

## 2023-01-15 ENCOUNTER — Ambulatory Visit: Payer: Commercial Managed Care - PPO | Admitting: Psychiatry

## 2023-01-16 ENCOUNTER — Ambulatory Visit
Admission: RE | Admit: 2023-01-16 | Discharge: 2023-01-16 | Disposition: A | Discharge status: Home / Self Care | Payer: Commercial Managed Care - PPO | Source: Ambulatory Visit | Attending: Internal Medicine | Admitting: Internal Medicine

## 2023-01-16 ENCOUNTER — Inpatient Hospital Stay: Admission: RE | Admit: 2023-01-16 | Payer: Commercial Managed Care - PPO | Source: Ambulatory Visit

## 2023-01-16 ENCOUNTER — Other Ambulatory Visit (HOSPITAL_BASED_OUTPATIENT_CLINIC_OR_DEPARTMENT_OTHER): Payer: Self-pay

## 2023-01-16 ENCOUNTER — Ambulatory Visit: Payer: Commercial Managed Care - PPO

## 2023-01-16 VITALS — BP 124/89 | HR 84 | Temp 98.2°F | Resp 16

## 2023-01-16 DIAGNOSIS — M5441 Lumbago with sciatica, right side: Secondary | ICD-10-CM | POA: Diagnosis not present

## 2023-01-16 DIAGNOSIS — M5442 Lumbago with sciatica, left side: Secondary | ICD-10-CM | POA: Diagnosis not present

## 2023-01-16 MED ORDER — KETOROLAC TROMETHAMINE 30 MG/ML IJ SOLN
30.0000 mg | Freq: Once | INTRAMUSCULAR | Status: AC
  Administered 2023-01-16: 30 mg via INTRAMUSCULAR

## 2023-01-16 MED ORDER — LIDOCAINE 5 % EX PTCH: 1.0000 | MEDICATED_PATCH | CUTANEOUS | 0 refills | Status: AC

## 2023-01-16 MED ORDER — CYCLOBENZAPRINE HCL 10 MG PO TABS
10.0000 mg | ORAL_TABLET | Freq: Two times a day (BID) | ORAL | 0 refills | Status: DC | PRN
Start: 2023-01-16 — End: 2023-05-22

## 2023-01-16 NOTE — Discharge Instructions (Signed)
You were given a Toradol injection in clinic today. Do not take any over the counter NSAID's such as Advil, ibuprofen, Aleve, or naproxen for 24 hours. You may take tylenol if needed.  You may take Flexeril as needed.  Please note this medication will make you drowsy.  Do not drink alcohol or drive while on this medication.  Lidoderm patch as needed.  Please leave on your lower back for 12 hours and remove for 12 hours.  You may continue heating pad (not while the Lidoderm patches on your back) and rest.  Please follow-up with your PCP in 2 days for recheck.  Please go to the ER for any worsening symptoms.  Hope you feel better soon!

## 2023-01-16 NOTE — ED Triage Notes (Signed)
Pt presents to UC for c/o mid lower back pain x4 days. Denies falling or direct injury. Tylenol and aleve with some relief.

## 2023-01-16 NOTE — ED Provider Notes (Signed)
[facility]    CSN: [id number] Arrival date & time: 01/16/23  1250      History   Chief Complaint Chief Complaint  Patient presents with   Back Pain    I have had lower back pain since Sunday 12/1. My leg is starting to hurt too. - Entered by patient    HPI Wendy Maynard is a 47 y.o. female presents for evaluation of back pain.  Patient reports 4 days of a constant bilateral low back pain that radiates down into her left leg.  Denies any known injury or inciting event.  Denies numbness/tingling/weakness of her lower extremities, no bowel or bladder incontinence, no saddle paresthesia.  No history of back surgeries or fractures.  She has been taking Tylenol, using a heating pad, and Aleve with some improvement.  No other concerns at this time.   Back Pain   Past Medical History:  Diagnosis Date   Abdominal pain in female patient 11/2012   Anxiety    Depression    Diabetes mellitus without complication (HCC)    Migraines    Polycystic ovarian disease     Patient Active Problem List   Diagnosis Date Noted   Chronic migraine without aura without status migrainosus, not intractable 07/17/2022   Migraine with aura and without status migrainosus, not intractable 03/29/2021   Chronic pain of right knee 03/29/2021   Morbid obesity with body mass index (BMI) of 40.0 to 44.9 in adult (HCC) 03/29/2021   OSA on CPAP 03/29/2021   Postmenopausal hormone replacement therapy 03/29/2021   Neck muscle spasm 03/29/2021   Elevated blood pressure reading without diagnosis of hypertension 03/29/2021   Snoring 12/07/2020   Excessive sleepiness 12/07/2020   Fatigue 12/07/2020   Insomnia 12/07/2020   Sleep paralysis 12/07/2020   ADHD, predominantly inattentive type 02/06/2018   Posttraumatic stress disorder 02/06/2018   MDD (major depressive disorder), recurrent severe, without psychosis (HCC) 04/28/2016   Chronic calculous cholecystitis 03/23/2013   Cholecystitis 02/26/2013     Past Surgical History:  Procedure Laterality Date   CHOLECYSTECTOMY N/A 02/26/2013   Procedure: LAPAROSCOPIC CHOLECYSTECTOMY WITH INTRAOPERATIVE CHOLANGIOGRAM;  Surgeon: Adolph Pollack, MD;  Location: [facility];  Service: General;  Laterality: N/A;   HYSTERECTOMY ABDOMINAL WITH SALPINGECTOMY     WISDOM TOOTH EXTRACTION      OB History   No obstetric history on file.      Home Medications    Prior to Admission medications   Medication Sig Start Date End Date Taking? Authorizing Provider  cyclobenzaprine (FLEXERIL) 10 MG tablet Take 1 tablet (10 mg total) by mouth 2 (two) times daily as needed for muscle spasms. 01/16/23  Yes Radford Pax, NP  lidocaine (LIDODERM) 5 % Place 1 patch onto the skin daily. Keep patch on for 12 hours and remove for 12 hours 01/16/23  Yes Radford Pax, NP  ALPRAZolam Prudy Feeler) 0.5 MG tablet Take 1 tablet (0.5 mg total) by mouth 2 (two) times daily as needed for anxiety. 12/16/22   Corie Chiquito, PMHNP  Ascorbic Acid (VITAMIN C) 100 MG tablet Take 100 mg by mouth daily.    [provider]  busPIRone (BUSPAR) 15 MG tablet Take 1 tablet (15 mg total) by mouth 2 (two) times daily. 11/14/22   Corie Chiquito, PMHNP  CALCIUM PO Take by mouth. Patient not taking: Reported on 11/14/2022    [provider]  CHELATED MAGNESIUM PO Take by mouth. Patient not taking: Reported on 11/14/2022    [provider]  eletriptan (RELPAX) 20 MG tablet Take 1 tablet (20 mg total) by mouth once for 1 dose. May repeat in 2 hours if headache persists or recurs.  No more than 2 doses in 24 hours. Patient not taking: Reported on 11/14/2022 06/06/22 06/06/22  Radford Pax, NP  estradiol (ESTRACE) 1 MG tablet Take 1 tablet (1 mg total) by mouth daily. 11/26/21   Marcine Matar, MD  estradiol (ESTRACE) 1 MG tablet Take 1 tablet (1 mg total) by mouth in the morning. 05/01/22     Fremanezumab-vfrm ([name]) 225 MG/1.5ML SOAJ Inject 225 mg into the skin every 30  (thirty) days. 07/17/22   Anson Fret, MD  gabapentin (NEURONTIN) 300 MG capsule Take 1 capsule (300 mg total) by mouth 3 (three) times daily. 11/18/22   Corie Chiquito, PMHNP  glycopyrrolate (ROBINUL) 1 MG tablet Take 1 tablet (1 mg total) by mouth 2 (two) times daily. Patient not taking: Reported on 11/14/2022 01/23/22   Marcine Matar, MD  glycopyrrolate (ROBINUL) 1 MG tablet Take 1 tablet (1 mg total) by mouth 2 (two) times daily. Patient not taking: Reported on 09/09/2022 05/01/22     Multiple Vitamin (MULTIVITAMIN) tablet Take 1 tablet by mouth daily.    [provider]  Omega-3 Fatty Acids (FISH OIL ADULT GUMMIES PO) Take by mouth.    [provider]  ondansetron (ZOFRAN) 4 MG tablet Take 1 tablet (4 mg total) by mouth every 6 (six) hours. Patient not taking: Reported on 09/09/2022 03/21/22   Roemhildt, Lorin T, PA-C  QUEtiapine (SEROQUEL) 300 MG tablet Take 1 tablet (300 mg total) by mouth at bedtime. 11/14/22   Corie Chiquito, PMHNP  sertraline (ZOLOFT) 100 MG tablet Take 2 tablets (200 mg total) by mouth daily. 11/14/22 05/13/23  Corie Chiquito, PMHNP  SUMAtriptan (IMITREX) 100 MG tablet Take 1 tablet (100 mg total) by mouth once as needed for up to 1 dose. May repeat in 2 hours if headache persists or recurs. 07/17/22   Anson Fret, MD  topiramate (TOPAMAX) 50 MG tablet Take 1 tablet by mouth at bedtime. In 1-2 weeks, may increase to 2 tablets at bedtime Patient not taking: Reported on 11/14/2022 07/17/22   Anson Fret, MD  vitamin B-12 (CYANOCOBALAMIN) 100 MCG tablet Take 100 mcg by mouth daily. Patient not taking: Reported on 11/14/2022    [provider]  zolpidem (AMBIEN) 10 MG tablet Take 1 tablet (10 mg total) by mouth at bedtime as needed. 11/14/22   Corie Chiquito, PMHNP    Family History Family History  Problem Relation Age of Onset   Depression Mother        in response to grief/loss of husband   Hypertension Father        Died sudenly at  age 98. Reports father may have had Aspergers   Migraines Father    Depression Sister    Anxiety disorder Sister    ADD / ADHD Sister    Migraines Sister    ADD / ADHD Cousin    Asperger's syndrome Cousin     Social History Social History   Tobacco Use   Smoking status: Former    Current packs/day: 0.00    Types: Cigarettes    Quit date: 01/2021    Years since quitting: 2.0   Smokeless tobacco: Never   Tobacco comments:    plans to start nicotine gum  Vaping Use   Vaping status: Never Used  Substance Use Topics  Alcohol use: Not Currently    Comment: Occasionally 1-2 x/mth   Drug use: No    Types: Marijuana    Comment: Last use 04/27/16     Allergies   Ciprofloxacin, Clindamycin/lincomycin, Vicodin [hydrocodone-acetaminophen], Penicillins, and Sulfa antibiotics   Review of Systems Review of Systems  Musculoskeletal:  Positive for back pain.     Physical Exam Triage Vital Signs ED Triage Vitals  Encounter Vitals Group     BP 01/16/23 1428 124/89     Systolic BP Percentile --      Diastolic BP Percentile --      Pulse Rate 01/16/23 1428 84     Resp 01/16/23 1428 16     Temp 01/16/23 1428 98.2 F (36.8 C)     Temp Source 01/16/23 1428 Oral     SpO2 01/16/23 1428 94 %     Weight --      Height --      Head Circumference --      Peak Flow --      Pain Score 01/16/23 1426 6     Pain Loc --      Pain Education --      Exclude from Growth Chart --    No data found.  Updated Vital Signs BP 124/89 (BP Location: Left Arm)   Pulse 84   Temp 98.2 F (36.8 C) (Oral)   Resp 16   LMP  (LMP Unknown)   SpO2 94%   Visual Acuity Right Eye Distance:   Left Eye Distance:   Bilateral Distance:    Right Eye Near:   Left Eye Near:    Bilateral Near:     Physical Exam Vitals and nursing note reviewed.  Constitutional:      General: She is not in acute distress.    Appearance: Normal appearance. She is obese. She is not ill-appearing.  HENT:     Head:  Normocephalic and atraumatic.  Eyes:     Pupils: Pupils are equal, round, and reactive to light.  Cardiovascular:     Rate and Rhythm: Normal rate.  Pulmonary:     Effort: Pulmonary effort is normal.  Musculoskeletal:     Thoracic back: No bony tenderness.     Lumbar back: Spasms and tenderness present. No swelling, edema, deformity, signs of trauma, lacerations or bony tenderness. Normal range of motion. Positive right straight leg raise test and positive left straight leg raise test. No scoliosis.       Back:     Comments: Strength 5 out of 5 bilateral lower extremities  Skin:    General: Skin is warm and dry.  Neurological:     General: No focal deficit present.     Mental Status: She is alert and oriented to person, place, and time.  Psychiatric:        Mood and Affect: Mood normal.        Behavior: Behavior normal.      UC Treatments / Results  Labs (all labs ordered are listed, but only abnormal results are displayed) Labs Reviewed - No data to display  EKG   Radiology No results found.  Procedures Procedures (including critical care time)  Medications Ordered in UC Medications  ketorolac (TORADOL) 30 MG/ML injection 30 mg (30 mg Intramuscular Given 01/16/23 1609)    Initial Impression / Assessment and Plan / UC Course  I have reviewed the triage vital signs and the nursing notes.  Pertinent labs & imaging results that were available  during my care of the patient were reviewed by me and considered in my medical decision making (see chart for details).     Reviewed exam and symptoms with patient.  No red flags.  Patient given Toradol injection in clinic.  Monitored for 10 minutes after injection with no reaction noted and tolerated well.  Was instructed no NSAIDs for 24 hours and verbalized understanding.  She declines steroid injection/oral prednisone due to intolerance.  Will do trial of Flexeril, side effect profile reviewed.  Lidoderm patch as prescribed.   Continue heat and rest.  PCP follow-up if symptoms do not improve.  ER precautions reviewed. Final Clinical Impressions(s) / UC Diagnoses   Final diagnoses:  Acute bilateral low back pain with bilateral sciatica     Discharge Instructions      You were given a Toradol injection in clinic today. Do not take any over the counter NSAID's such as Advil, ibuprofen, Aleve, or naproxen for 24 hours. You may take tylenol if needed.  You may take Flexeril as needed.  Please note this medication will make you drowsy.  Do not drink alcohol or drive while on this medication.  Lidoderm patch as needed.  Please leave on your lower back for 12 hours and remove for 12 hours.  You may continue heating pad (not while the Lidoderm patches on your back) and rest.  Please follow-up with your PCP in 2 days for recheck.  Please go to the ER for any worsening symptoms.  Hope you feel better soon!      ED Prescriptions     Medication Sig Dispense Auth. Provider   cyclobenzaprine (FLEXERIL) 10 MG tablet Take 1 tablet (10 mg total) by mouth 2 (two) times daily as needed for muscle spasms. 10 tablet Radford Pax, NP   lidocaine (LIDODERM) 5 % Place 1 patch onto the skin daily. Keep patch on for 12 hours and remove for 12 hours 10 patch Radford Pax, NP      PDMP not reviewed this encounter.   Radford Pax, NP 01/16/23 [phone]

## 2023-01-24 ENCOUNTER — Encounter: Payer: Self-pay | Admitting: Neurology

## 2023-02-07 ENCOUNTER — Telehealth: Payer: Self-pay | Admitting: Psychiatry

## 2023-02-07 ENCOUNTER — Other Ambulatory Visit (HOSPITAL_BASED_OUTPATIENT_CLINIC_OR_DEPARTMENT_OTHER): Payer: Self-pay

## 2023-02-07 ENCOUNTER — Other Ambulatory Visit: Payer: Self-pay | Admitting: Psychiatry

## 2023-02-07 ENCOUNTER — Other Ambulatory Visit: Payer: Self-pay

## 2023-02-07 DIAGNOSIS — F5101 Primary insomnia: Secondary | ICD-10-CM

## 2023-02-07 MED ORDER — ZOLPIDEM TARTRATE 10 MG PO TABS
10.0000 mg | ORAL_TABLET | Freq: Every evening | ORAL | 2 refills | Status: DC | PRN
  Filled 2023-02-07: qty 30, 30d supply, fill #0
  Filled 2023-02-18: qty 30, 30d supply, fill #1
  Filled 2023-03-06 – 2023-03-17 (×4): qty 30, 30d supply, fill #2
  Filled ????-??-??: fill #1

## 2023-02-07 NOTE — Telephone Encounter (Signed)
Next visit is 02/18/23. Requesting refill on Ambien 10 mg called to:  [facility]   Phone: [phone]  Fax: [phone]

## 2023-02-07 NOTE — Telephone Encounter (Signed)
Pended Ambien to [facility]

## 2023-02-12 DIAGNOSIS — G4733 Obstructive sleep apnea (adult) (pediatric): Secondary | ICD-10-CM | POA: Diagnosis not present

## 2023-02-14 ENCOUNTER — Other Ambulatory Visit (HOSPITAL_BASED_OUTPATIENT_CLINIC_OR_DEPARTMENT_OTHER): Payer: Self-pay

## 2023-02-14 ENCOUNTER — Other Ambulatory Visit: Payer: Self-pay

## 2023-02-14 ENCOUNTER — Telehealth: Payer: Self-pay | Admitting: Psychiatry

## 2023-02-14 DIAGNOSIS — F3342 Major depressive disorder, recurrent, in full remission: Secondary | ICD-10-CM

## 2023-02-14 DIAGNOSIS — F4310 Post-traumatic stress disorder, unspecified: Secondary | ICD-10-CM

## 2023-02-14 DIAGNOSIS — F411 Generalized anxiety disorder: Secondary | ICD-10-CM

## 2023-02-14 MED ORDER — QUETIAPINE FUMARATE 300 MG PO TABS
300.0000 mg | ORAL_TABLET | Freq: Every day | ORAL | 0 refills | Status: DC
Start: 2023-02-14 — End: 2023-02-18
  Filled 2023-02-14 (×2): qty 90, 90d supply, fill #0
  Filled 2023-02-15: qty 30, 30d supply, fill #0

## 2023-02-14 NOTE — Telephone Encounter (Signed)
 Wendy Maynard called at 9:13 to request refill of her Seroquel.  Apt 02/18/23 but only has 1 pill left.  Please send to [facility]

## 2023-02-14 NOTE — Telephone Encounter (Signed)
 Rx for Seroquel 300 sent to [facility].

## 2023-02-15 ENCOUNTER — Other Ambulatory Visit (HOSPITAL_BASED_OUTPATIENT_CLINIC_OR_DEPARTMENT_OTHER): Payer: Self-pay

## 2023-02-16 ENCOUNTER — Other Ambulatory Visit: Payer: Self-pay

## 2023-02-17 ENCOUNTER — Other Ambulatory Visit: Payer: Self-pay | Admitting: Psychiatry

## 2023-02-17 ENCOUNTER — Other Ambulatory Visit (HOSPITAL_BASED_OUTPATIENT_CLINIC_OR_DEPARTMENT_OTHER): Payer: Self-pay

## 2023-02-17 DIAGNOSIS — F410 Panic disorder [episodic paroxysmal anxiety] without agoraphobia: Secondary | ICD-10-CM

## 2023-02-17 MED ORDER — ALPRAZOLAM 0.5 MG PO TABS
0.5000 mg | ORAL_TABLET | Freq: Two times a day (BID) | ORAL | 2 refills | Status: DC | PRN
  Filled 2023-02-17: qty 60, 30d supply, fill #0
  Filled 2023-02-27 – 2023-03-17 (×5): qty 60, 30d supply, fill #1
  Filled 2023-04-09 – 2023-04-14 (×2): qty 60, 30d supply, fill #2

## 2023-02-18 ENCOUNTER — Encounter (HOSPITAL_BASED_OUTPATIENT_CLINIC_OR_DEPARTMENT_OTHER): Payer: Self-pay

## 2023-02-18 ENCOUNTER — Ambulatory Visit (INDEPENDENT_AMBULATORY_CARE_PROVIDER_SITE_OTHER): Payer: Commercial Managed Care - PPO | Admitting: Psychiatry

## 2023-02-18 ENCOUNTER — Other Ambulatory Visit (HOSPITAL_BASED_OUTPATIENT_CLINIC_OR_DEPARTMENT_OTHER): Payer: Self-pay

## 2023-02-18 ENCOUNTER — Encounter: Payer: Self-pay | Admitting: Psychiatry

## 2023-02-18 VITALS — BP 120/80 | HR 85

## 2023-02-18 DIAGNOSIS — F332 Major depressive disorder, recurrent severe without psychotic features: Secondary | ICD-10-CM

## 2023-02-18 DIAGNOSIS — F3342 Major depressive disorder, recurrent, in full remission: Secondary | ICD-10-CM

## 2023-02-18 DIAGNOSIS — F411 Generalized anxiety disorder: Secondary | ICD-10-CM | POA: Diagnosis not present

## 2023-02-18 DIAGNOSIS — G4700 Insomnia, unspecified: Secondary | ICD-10-CM | POA: Diagnosis not present

## 2023-02-18 DIAGNOSIS — F4310 Post-traumatic stress disorder, unspecified: Secondary | ICD-10-CM | POA: Diagnosis not present

## 2023-02-18 DIAGNOSIS — F410 Panic disorder [episodic paroxysmal anxiety] without agoraphobia: Secondary | ICD-10-CM | POA: Diagnosis not present

## 2023-02-18 DIAGNOSIS — F900 Attention-deficit hyperactivity disorder, predominantly inattentive type: Secondary | ICD-10-CM | POA: Diagnosis not present

## 2023-02-18 MED ORDER — QUETIAPINE FUMARATE 300 MG PO TABS
300.0000 mg | ORAL_TABLET | Freq: Every day | ORAL | 1 refills | Status: DC
  Filled 2023-02-18 – 2023-03-06 (×2): qty 90, 90d supply, fill #0
  Filled 2023-05-19 – 2023-06-03 (×4): qty 90, 90d supply, fill #1

## 2023-02-18 MED ORDER — SERTRALINE HCL 100 MG PO TABS
200.0000 mg | ORAL_TABLET | Freq: Every day | ORAL | 1 refills | Status: DC
  Filled 2023-02-18 – 2023-04-14 (×4): qty 180, 90d supply, fill #0

## 2023-02-18 MED ORDER — AMPHETAMINE-DEXTROAMPHETAMINE 15 MG PO TABS
15.0000 mg | ORAL_TABLET | Freq: Every day | ORAL | 0 refills | Status: DC
  Filled 2023-04-15: qty 60, 60d supply, fill #0

## 2023-02-18 MED ORDER — BUSPIRONE HCL 15 MG PO TABS
15.0000 mg | ORAL_TABLET | Freq: Two times a day (BID) | ORAL | 1 refills | Status: DC
  Filled 2023-02-18: qty 180, 90d supply, fill #0

## 2023-02-18 MED ORDER — AMPHETAMINE-DEXTROAMPHETAMINE 15 MG PO TABS
15.0000 mg | ORAL_TABLET | Freq: Two times a day (BID) | ORAL | 0 refills | Status: DC
  Filled 2023-02-18: qty 60, 30d supply, fill #0

## 2023-02-18 MED ORDER — AMPHETAMINE-DEXTROAMPHETAMINE 15 MG PO TABS
15.0000 mg | ORAL_TABLET | Freq: Two times a day (BID) | ORAL | 0 refills | Status: DC
  Filled 2023-03-18: qty 60, 30d supply, fill #0

## 2023-02-18 MED ORDER — ALPRAZOLAM 0.5 MG PO TABS
0.5000 mg | ORAL_TABLET | Freq: Every evening | ORAL | 2 refills | Status: DC | PRN
  Filled 2023-02-18: qty 60, 60d supply, fill #0
  Filled 2023-05-12: qty 30, 30d supply, fill #0
  Filled 2023-05-19: qty 30, 30d supply, fill #1

## 2023-02-18 MED ORDER — ZOLPIDEM TARTRATE 10 MG PO TABS
10.0000 mg | ORAL_TABLET | Freq: Every evening | ORAL | 2 refills | Status: DC | PRN
  Filled 2023-02-18 – 2023-04-14 (×2): qty 30, 30d supply, fill #0
  Filled 2023-05-02 – 2023-05-08 (×2): qty 30, 30d supply, fill #1
  Filled 2023-05-19 (×7): qty 30, 30d supply, fill #2

## 2023-02-18 MED ORDER — GABAPENTIN 300 MG PO CAPS
300.0000 mg | ORAL_CAPSULE | Freq: Three times a day (TID) | ORAL | 5 refills | Status: DC
  Filled 2023-03-31 – 2023-04-14 (×3): qty 90, 30d supply, fill #0
  Filled 2023-05-19 (×2): qty 90, 30d supply, fill #1

## 2023-02-18 NOTE — Progress Notes (Signed)
 Wendy Maynard [id number] [birth year] 48 y.o.  Virtual Visit via Telephone Note  I connected with pt on 02/18/23  at  1:15 PM EST by telephone and verified that I am speaking with the correct person //using two identifiers.   I discussed the limitations, risks, security and privacy concerns of performing an evaluation and management service by telephone and the availability of in person appointments. I also discussed with the patient that there may be a patient responsible charge related to this service. The patient expressed understanding and agreed to proceed.   I discussed the assessment and treatment plan with the patient. The patient was provided an opportunity to ask questions and all were answered. The patient agreed with the plan and demonstrated an understanding of the instructions.   The patient was advised to call back or seek an in-person evaluation if the symptoms worsen or if the condition fails to improve as anticipated.  I provided 32 minutes of non-face-to-face time during this encounter.  The patient was located at work.  The provider was located at [facility].   Harlene Pepper, PMHNP   Subjective:   Patient ID:  Wendy Maynard is a 48 y.o. (DOB 03-09-1975) female.  Chief Complaint:  Chief Complaint  Patient presents with   ADHD   Follow-up    Anxiety, depression    HPI [name] presents for follow-up of anxiety, depression, and insomnia. She reports, everything is going ok. She reports that she notices lower energy and motivation. She reports that she attributes this to ADHD and had a lot of moving parts. She reports difficulty with executive dysfunction. She reports that she is doing ok at work. She reports that her mind wanders some at work and she is easily distracted. She reports that her productivity is a little bit slower. She reports that she has not ben doing her projects because I'm so scattered. She has had some nightmares, panic attacks,  intrusive memories, and flashbacks. She attributes this to doing more trauma work. She reports that she has been having to use coping strategies to manage anxiety. Reports panic attacks a few times a week. She reports some worry and generalized anxiety.   Mood has been fine. She reports that she had some sadness over the holidays with sister being estranged and thinking about the loss of her father. She reports that she has had some sleep disturbance and that this may be hormone related. She has been waking up in the middle of the night. Sleep qty can be 4-5 hours a couple nights a week and good nights are 7-8 hours a night. She reports that her appetite has been ok. She has been eating less during the day. Denies anhedonia. Looking forward to trip to [facility] in March. Denies SI.   Noticed that she felt down when she missed Seroquel  for a few days.   Alprazolam  last filled 02/17/23 Gabapentin  last filled 02/14/23 x2.  Ambien  last filled 02/07/23  Past Psychiatric Medication Trials: Abilify - Helped mood. Wt gain.  Sertraline -Helpful  Lexapro- Felt tired and irritable Prozac Cymbalta - adverse effects Wellbutrin XL- Insomnia Trazodone -ineffective Seroquel  Gabapentin  Ambien  Xanax  Lorazepam  Hydroxyzine - Ineffective Adderall  Adderall XR- Insomnia, irritability, dry mouth Vyvanse- insomnia Topamax - Double vision    Review of Systems:  Review of Systems  Constitutional:        Hot flashes  Musculoskeletal:  Negative for gait problem.  Neurological:        She reports improved migraines  Psychiatric/Behavioral:  Please refer to HPI    Medications: I have reviewed the patient's current medications.  Current Outpatient Medications  Medication Sig Dispense Refill   ALPRAZolam  (XANAX ) 0.5 MG tablet Take 1 tablet (0.5 mg total) by mouth 2 (two) times daily as needed for anxiety. 60 tablet 2   [START ON 05/12/2023] ALPRAZolam  (XANAX ) 0.5 MG tablet Take 1 tablet (0.5 mg total) by  mouth at bedtime as needed for anxiety. 60 tablet 2   [START ON 03/18/2023] amphetamine -dextroamphetamine  (ADDERALL) 15 MG tablet Take 1 tablet by mouth 2 (two) times daily. 60 tablet 0   [START ON 04/15/2023] amphetamine -dextroamphetamine  (ADDERALL) 15 MG tablet Take 1 tablet by mouth daily. 60 tablet 0   Ascorbic Acid (VITAMIN C) 100 MG tablet Take 100 mg by mouth daily.     CHELATED MAGNESIUM  PO Take by mouth.     Fremanezumab -vfrm ([name] ) 225 MG/1.5ML SOAJ Inject 225 mg into the skin every 30 (thirty) days. 1.5 mL 11   lidocaine  (LIDODERM ) 5 % Place 1 patch onto the skin daily. Keep patch on for 12 hours and remove for 12 hours 10 patch 0   Multiple Vitamin (MULTIVITAMIN) tablet Take 1 tablet by mouth daily.     Omega-3 Fatty Acids (FISH OIL ADULT GUMMIES PO) Take by mouth.     SUMAtriptan  (IMITREX ) 100 MG tablet Take 1 tablet (100 mg total) by mouth once as needed for up to 1 dose. May repeat in 2 hours if headache persists or recurs. 10 tablet 12   zolpidem  (AMBIEN ) 10 MG tablet Take 1 tablet (10 mg total) by mouth at bedtime as needed. 30 tablet 2   [START ON 05/02/2023] zolpidem  (AMBIEN ) 10 MG tablet Take 1 tablet (10 mg total) by mouth at bedtime as needed for sleep. 30 tablet 2   amphetamine -dextroamphetamine  (ADDERALL) 15 MG tablet Take 1 tablet by mouth 2 (two) times daily. 60 tablet 0   busPIRone  (BUSPAR ) 15 MG tablet Take 1 tablet (15 mg total) by mouth 2 (two) times daily. 180 tablet 1   CALCIUM PO Take by mouth. (Patient not taking: Reported on 02/18/2023)     cyclobenzaprine  (FLEXERIL ) 10 MG tablet Take 1 tablet (10 mg total) by mouth 2 (two) times daily as needed for muscle spasms. (Patient not taking: Reported on 02/18/2023) 10 tablet 0   eletriptan  (RELPAX ) 20 MG tablet Take 1 tablet (20 mg total) by mouth once for 1 dose. May repeat in 2 hours if headache persists or recurs.  No more than 2 doses in 24 hours. (Patient not taking: Reported on 11/14/2022) 10 tablet 0   estradiol  (ESTRACE ) 1  MG tablet Take 1 tablet (1 mg total) by mouth daily. (Patient not taking: Reported on 02/18/2023) 30 tablet 4   estradiol  (ESTRACE ) 1 MG tablet Take 1 tablet (1 mg total) by mouth in the morning. (Patient not taking: Reported on 02/18/2023) 30 tablet 1   [START ON 03/17/2023] gabapentin  (NEURONTIN ) 300 MG capsule Take 1 capsule (300 mg total) by mouth 3 (three) times daily. 90 capsule 5   glycopyrrolate  (ROBINUL ) 1 MG tablet Take 1 tablet (1 mg total) by mouth 2 (two) times daily. (Patient not taking: Reported on 11/14/2022) 60 tablet 4   glycopyrrolate  (ROBINUL ) 1 MG tablet Take 1 tablet (1 mg total) by mouth 2 (two) times daily. (Patient not taking: Reported on 02/18/2023) 60 tablet 1   ondansetron  (ZOFRAN ) 4 MG tablet Take 1 tablet (4 mg total) by mouth every 6 (six) hours. (Patient not taking: Reported  on 09/09/2022) 12 tablet 0   QUEtiapine  (SEROQUEL ) 300 MG tablet Take 1 tablet (300 mg total) by mouth at bedtime. 90 tablet 1   sertraline  (ZOLOFT ) 100 MG tablet Take 2 tablets (200 mg total) by mouth daily. 180 tablet 1   topiramate  (TOPAMAX ) 50 MG tablet Take 1 tablet by mouth at bedtime. In 1-2 weeks, may increase to 2 tablets at bedtime (Patient not taking: Reported on 02/18/2023) 60 tablet 11   vitamin B-12 (CYANOCOBALAMIN ) 100 MCG tablet Take 100 mcg by mouth daily. (Patient not taking: Reported on 11/14/2022)     Current Facility-Administered Medications  Medication Dose Route Frequency Provider Last Rate Last Admin   Fremanezumab -vfrm SOSY 225 mg  225 mg Subcutaneous Once Ahern, Antonia B, MD        Medication Side Effects: None. Pt denies any involuntary movements.   Allergies:  Allergies  Allergen Reactions   Ciprofloxacin  Hives and Other (See Comments)    Started in arm as soon as drug started.   Clindamycin /Lincomycin Diarrhea and Other (See Comments)    Causes excessive diarrhea   Vicodin [Hydrocodone -Acetaminophen ] Itching and Nausea And Vomiting   Penicillins Rash and Other (See  Comments)    Occurred at 48 years old Has patient had a PCN reaction causing immediate rash, facial/tongue/throat swelling, SOB or lightheadedness with hypotension: yes Has patient had a PCN reaction causing severe rash involving mucus membranes or skin necrosis: no Has patient had a PCN reaction that required hospitalization no Has patient had a PCN reaction occurring within the last 10 years:no If all of the above answers are NO, then may proceed with Cephalosporin use.    Sulfa Antibiotics Rash    Past Medical History:  Diagnosis Date   Abdominal pain in female patient 11/2012   Anxiety    Depression    Diabetes mellitus without complication (HCC)    Migraines    Polycystic ovarian disease     Family History  Problem Relation Age of Onset   Depression Mother        in response to grief/loss of husband   Hypertension Father        Died sudenly at age 8. Reports father may have had Aspergers   Migraines Father    Depression Sister    Anxiety disorder Sister    ADD / ADHD Sister    Migraines Sister    ADD / ADHD Cousin    Asperger's syndrome Cousin     Social History   Socioeconomic History   Marital status: Single    Spouse name: Not on file   Number of children: 0   Years of education: Not on file   Highest education level: Master's degree (e.g., MA, MS, MEng, MEd, MSW, MBA)  Occupational History   Occupation: Retail Buyer at [employer]  Tobacco Use   Smoking status: Former    Current packs/day: 0.00    Types: Cigarettes    Quit date: 01/2021    Years since quitting: 2.1   Smokeless tobacco: Never   Tobacco comments:    plans to start nicotine  gum  Vaping Use   Vaping status: Never Used  Substance and Sexual Activity   Alcohol use: Not Currently    Comment: Occasionally 1-2 x/mth   Drug use: No    Types: Marijuana    Comment: Last use 04/27/16   Sexual activity: Not Currently    Birth control/protection: Condom  Other Topics Concern   Not on file   Social History Narrative  Not on file   Social Drivers of Health   Financial Resource Strain: Not on file  Food Insecurity: Not on file  Transportation Needs: Not on file  Physical Activity: Not on file  Stress: Not on file  Social Connections: Not on file  Intimate Partner Violence: Not on file    Past Medical History, Surgical history, Social history, and Family history were reviewed and updated as appropriate.   Please see review of systems for further details on the patient's review from today.   Objective:   Physical Exam:  BP 120/80   Pulse 85   LMP  (LMP Unknown)   Physical Exam Neurological:     Mental Status: She is alert and oriented to person, place, and time.     Cranial Nerves: No dysarthria.  Psychiatric:        Attention and Perception: Attention and perception normal.        Mood and Affect: Mood is anxious.        Speech: Speech normal.        Behavior: Behavior is cooperative.        Thought Content: Thought content normal. Thought content is not paranoid or delusional. Thought content does not include homicidal or suicidal ideation. Thought content does not include homicidal or suicidal plan.        Cognition and Memory: Cognition and memory normal.        Judgment: Judgment normal.     Comments: Insight intact     Lab Review:     Component Value Date/Time   NA 139 03/29/2022 2347   NA 141 03/29/2021 1514   K 3.2 (L) 03/29/2022 2347   CL 99 03/29/2022 2347   CO2 27 03/29/2022 2347   GLUCOSE 177 (H) 03/29/2022 2347   BUN 15 03/29/2022 2347   BUN 13 03/29/2021 1514   CREATININE 0.91 03/29/2022 2347   CALCIUM 8.9 03/29/2022 2347   PROT 7.3 03/29/2021 1514   ALBUMIN 4.9 (H) 03/29/2021 1514   AST 15 03/29/2021 1514   ALT 14 03/29/2021 1514   ALKPHOS 114 03/29/2021 1514   BILITOT <0.2 03/29/2021 1514   GFRNONAA >60 03/29/2022 2347   GFRAA >60 04/28/2016 1836       Component Value Date/Time   WBC 18.6 (H) 03/29/2022 2347   RBC 5.29 (H)  03/29/2022 2347   HGB 12.8 03/29/2022 2347   HGB 12.1 03/29/2021 1514   HCT 41.6 03/29/2022 2347   HCT 37.9 03/29/2021 1514   PLT 258 03/29/2022 2347   PLT 263 03/29/2021 1514   MCV 78.6 (L) 03/29/2022 2347   MCV 74 (L) 03/29/2021 1514   MCH 24.2 (L) 03/29/2022 2347   MCHC 30.8 03/29/2022 2347   RDW 14.9 03/29/2022 2347   RDW 15.4 03/29/2021 1514   LYMPHSABS 1.6 03/29/2022 2347   MONOABS 0.6 03/29/2022 2347   EOSABS 0.2 03/29/2022 2347   BASOSABS 0.1 03/29/2022 2347    No results found for: POCLITH, LITHIUM   No results found for: PHENYTOIN, PHENOBARB, VALPROATE, CBMZ   .res Assessment: Plan:    33 minutes spent dedicated to the care of this patient on the date of this encounter to include pre-visit review of records, ordering of medication, post visit documentation, and face-to-face time with the patient discussing recent worsening ADHD symptoms and re-starting Adderall for ADHD. Will re-start previous dose of Adderall 15 mg twice daily for ADHD.  Will continue Sertraline  200 mg daily for anxiety and depression.  Continue Seroquel  300  mg at bedtime for augmentation of depression and off-label indications of anxiety and insomnia.  Continue Alprazolam  0.5 mg at bedtime as needed for anxiety. Continue Buspar  15 mg twice daily for anxiety.  Continue Gabapentin  300 mg three times daily for anxiety. She reports that Gabapentin  has been helpful for her pain as well.  Continue Ambien  10 mg at bedtime as needed for insomnia.  Recommend continuing therapy with Holly Ingram, [facility].  Pt to follow-up in 3 months or sooner if clinically indicated.  Patient advised to contact office with any questions, adverse effects, or acute worsening in signs and symptoms.    Evi was seen today for adhd and follow-up.  Diagnoses and all orders for this visit:  Attention deficit hyperactivity disorder (ADHD), predominantly inattentive type -     amphetamine -dextroamphetamine  (ADDERALL) 15  MG tablet; Take 1 tablet by mouth 2 (two) times daily. -     amphetamine -dextroamphetamine  (ADDERALL) 15 MG tablet; Take 1 tablet by mouth 2 (two) times daily. -     amphetamine -dextroamphetamine  (ADDERALL) 15 MG tablet; Take 1 tablet by mouth daily.  Generalized anxiety disorder -     busPIRone  (BUSPAR ) 15 MG tablet; Take 1 tablet (15 mg total) by mouth 2 (two) times daily. -     QUEtiapine  (SEROQUEL ) 300 MG tablet; Take 1 tablet (300 mg total) by mouth at bedtime.  Panic disorder -     ALPRAZolam  (XANAX ) 0.5 MG tablet; Take 1 tablet (0.5 mg total) by mouth at bedtime as needed for anxiety.  Posttraumatic stress disorder -     ALPRAZolam  (XANAX ) 0.5 MG tablet; Take 1 tablet (0.5 mg total) by mouth at bedtime as needed for anxiety. -     sertraline  (ZOLOFT ) 100 MG tablet; Take 2 tablets (200 mg total) by mouth daily. -     QUEtiapine  (SEROQUEL ) 300 MG tablet; Take 1 tablet (300 mg total) by mouth at bedtime. -     gabapentin  (NEURONTIN ) 300 MG capsule; Take 1 capsule (300 mg total) by mouth 3 (three) times daily.  Recurrent major depressive disorder, in full remission (HCC) -     sertraline  (ZOLOFT ) 100 MG tablet; Take 2 tablets (200 mg total) by mouth daily. -     QUEtiapine  (SEROQUEL ) 300 MG tablet; Take 1 tablet (300 mg total) by mouth at bedtime.  Insomnia, unspecified type -     zolpidem  (AMBIEN ) 10 MG tablet; Take 1 tablet (10 mg total) by mouth at bedtime as needed for sleep.  MDD (major depressive disorder), recurrent severe, without psychosis (HCC)    Please see After Visit Summary for patient specific instructions.  Future Appointments  Date Time Provider Department Center  03/12/2023  5:00 PM Gail Castilla, [facility] CP-CP None  04/09/2023  5:00 PM Gail Castilla, [facility] None  04/24/2023  5:00 PM Gail Castilla, [facility] CP-CP None  05/06/2023  3:00 PM Ines Onetha NOVAK, MD [facility] None  05/07/2023  5:00 PM Gail Castilla, [facility] CP[facility] None    No orders of the defined types were  placed in this encounter.     -------------------------------

## 2023-02-21 ENCOUNTER — Other Ambulatory Visit (HOSPITAL_BASED_OUTPATIENT_CLINIC_OR_DEPARTMENT_OTHER): Payer: Self-pay

## 2023-02-26 ENCOUNTER — Other Ambulatory Visit (HOSPITAL_BASED_OUTPATIENT_CLINIC_OR_DEPARTMENT_OTHER): Payer: Self-pay

## 2023-02-27 ENCOUNTER — Other Ambulatory Visit (HOSPITAL_BASED_OUTPATIENT_CLINIC_OR_DEPARTMENT_OTHER): Payer: Self-pay

## 2023-03-04 ENCOUNTER — Other Ambulatory Visit (HOSPITAL_BASED_OUTPATIENT_CLINIC_OR_DEPARTMENT_OTHER): Payer: Self-pay

## 2023-03-06 ENCOUNTER — Other Ambulatory Visit: Payer: Self-pay

## 2023-03-06 ENCOUNTER — Other Ambulatory Visit (HOSPITAL_BASED_OUTPATIENT_CLINIC_OR_DEPARTMENT_OTHER): Payer: Self-pay

## 2023-03-10 ENCOUNTER — Other Ambulatory Visit (HOSPITAL_BASED_OUTPATIENT_CLINIC_OR_DEPARTMENT_OTHER): Payer: Self-pay

## 2023-03-12 ENCOUNTER — Ambulatory Visit: Payer: Commercial Managed Care - PPO | Admitting: Psychiatry

## 2023-03-12 ENCOUNTER — Other Ambulatory Visit: Payer: Self-pay

## 2023-03-13 NOTE — Progress Notes (Signed)
Patient left a message stating she had a migraine and could not attend session.

## 2023-03-17 ENCOUNTER — Other Ambulatory Visit (HOSPITAL_BASED_OUTPATIENT_CLINIC_OR_DEPARTMENT_OTHER): Payer: Self-pay

## 2023-03-17 ENCOUNTER — Other Ambulatory Visit: Payer: Self-pay | Admitting: Psychiatry

## 2023-03-17 ENCOUNTER — Other Ambulatory Visit: Payer: Self-pay

## 2023-03-17 DIAGNOSIS — F900 Attention-deficit hyperactivity disorder, predominantly inattentive type: Secondary | ICD-10-CM

## 2023-03-17 NOTE — Telephone Encounter (Signed)
Patient is not sure if she will continue to follow with CR or will follow with Shanda Bumps. She is not due for FU until April. She is reporting she left a message for an appt with [facility] last week and was unable to keep it. I don't know the time frame, but it showed on MyChart as a no show.ad

## 2023-03-18 ENCOUNTER — Other Ambulatory Visit (HOSPITAL_BASED_OUTPATIENT_CLINIC_OR_DEPARTMENT_OTHER): Payer: Self-pay

## 2023-03-18 ENCOUNTER — Other Ambulatory Visit: Payer: Self-pay

## 2023-03-18 NOTE — Telephone Encounter (Signed)
 Pt called back at 11:02a to state she is going to try to follow Harlene, but hasn't called her yet.  She has two problems with her scripts.    The Adderall script dated for March 4 is wrong.  It should be 60 pills for 2 times a day, not once a day.  The Xanax  script for March 31 is also wrong.  It should be 60 pills for 2 times a day, not once a day.

## 2023-03-20 ENCOUNTER — Telehealth: Payer: Self-pay | Admitting: Neurology

## 2023-03-20 NOTE — Telephone Encounter (Signed)
 Have not seen patient since 07/2022. She needs to follow up with her primary care for the loss of consciousness, it could be many things not just neurologic. In the case of an unbearable migraine the best route is the emergency room it could be many things again, it coul dbe neurologic in the brain or something totally different. I do not have anything earlier.  She should see her pcp for the loss of consciousness and go to the ED if still having the unbearable migraine. If an NP has anything and her migraine is improved and wants sooner follow up for migraine she can see an NP. If she is still having a bad headache needs emergency room thank you Thank you

## 2023-03-20 NOTE — Telephone Encounter (Signed)
 Pt said, had a migraine started Monday lasted through Tuesday. Took SUMAtriptan  (IMITREX ) 100 MG tablet. I do not know if I took too medication but, my mother could not wake me up. When finally woke up the migraine was unbearable. Pt requested to change MyChart visit to an office visit on 05/06/23. Asking if there is a earlier appointment.  Would like call back.

## 2023-03-20 NOTE — Telephone Encounter (Signed)
 Called and spoke to pt and relayed Dr. Harding Li recommendations. Pt stated that the migraine went away and will keep current appt with Dr. Tresia Fruit

## 2023-03-27 ENCOUNTER — Ambulatory Visit: Payer: Commercial Managed Care - PPO | Admitting: Psychiatry

## 2023-03-27 ENCOUNTER — Other Ambulatory Visit (HOSPITAL_BASED_OUTPATIENT_CLINIC_OR_DEPARTMENT_OTHER): Payer: Self-pay

## 2023-03-27 DIAGNOSIS — N951 Menopausal and female climacteric states: Secondary | ICD-10-CM | POA: Diagnosis not present

## 2023-03-27 DIAGNOSIS — R61 Generalized hyperhidrosis: Secondary | ICD-10-CM | POA: Diagnosis not present

## 2023-03-27 MED ORDER — ESTRADIOL 0.1 MG/24HR TD PTTW
1.0000 | MEDICATED_PATCH | TRANSDERMAL | 3 refills | Status: DC
  Filled 2023-03-27 – 2023-04-14 (×3): qty 24, 84d supply, fill #0
  Filled 2023-06-16 – 2023-07-02 (×2): qty 24, 84d supply, fill #1
  Filled 2023-09-15: qty 24, 84d supply, fill #2
  Filled 2023-12-15: qty 24, 84d supply, fill #3

## 2023-03-31 ENCOUNTER — Other Ambulatory Visit (HOSPITAL_BASED_OUTPATIENT_CLINIC_OR_DEPARTMENT_OTHER): Payer: Self-pay

## 2023-04-07 ENCOUNTER — Other Ambulatory Visit (HOSPITAL_BASED_OUTPATIENT_CLINIC_OR_DEPARTMENT_OTHER): Payer: Self-pay

## 2023-04-09 ENCOUNTER — Ambulatory Visit: Payer: Commercial Managed Care - PPO | Admitting: Psychiatry

## 2023-04-09 ENCOUNTER — Telehealth: Payer: Self-pay | Admitting: Psychiatry

## 2023-04-09 ENCOUNTER — Other Ambulatory Visit (HOSPITAL_BASED_OUTPATIENT_CLINIC_OR_DEPARTMENT_OTHER): Payer: Self-pay

## 2023-04-09 NOTE — Telephone Encounter (Signed)
 LF 2/4, due 3/4. Will send corrected Rx when due. Pt notified.

## 2023-04-09 NOTE — Telephone Encounter (Signed)
 Pt called and said that the adderall script that Shanda Bumps wrote for march is wrong. She takes 2 pills a day  not one. Please fix the script

## 2023-04-09 NOTE — Telephone Encounter (Signed)
 LVM to [facility]. Patient does not have an appt with a [facility] provider. Is she going to follow with Shanda Bumps and if so does she have an appt?

## 2023-04-14 ENCOUNTER — Other Ambulatory Visit: Payer: Self-pay

## 2023-04-14 ENCOUNTER — Other Ambulatory Visit (HOSPITAL_BASED_OUTPATIENT_CLINIC_OR_DEPARTMENT_OTHER): Payer: Self-pay

## 2023-04-15 ENCOUNTER — Other Ambulatory Visit (HOSPITAL_BASED_OUTPATIENT_CLINIC_OR_DEPARTMENT_OTHER): Payer: Self-pay

## 2023-04-17 ENCOUNTER — Other Ambulatory Visit (HOSPITAL_BASED_OUTPATIENT_CLINIC_OR_DEPARTMENT_OTHER): Payer: Self-pay

## 2023-04-17 NOTE — Telephone Encounter (Signed)
 LVM to [facility]

## 2023-04-18 NOTE — Telephone Encounter (Signed)
 Pt has not returned call but on checking the database she filled on 3/4.

## 2023-04-24 ENCOUNTER — Ambulatory Visit: Payer: Commercial Managed Care - PPO | Admitting: Psychiatry

## 2023-04-24 ENCOUNTER — Ambulatory Visit: Payer: Self-pay | Admitting: Internal Medicine

## 2023-04-24 DIAGNOSIS — E119 Type 2 diabetes mellitus without complications: Secondary | ICD-10-CM | POA: Insufficient documentation

## 2023-04-24 NOTE — Telephone Encounter (Signed)
 Chief Complaint: abd pain Symptoms: pain, nausea, vomiting, diarrhea Frequency: 6 days Pertinent Negatives: Patient denies fever, urinary symptoms, CP, SOB Disposition: [] ED /[] Urgent Care (no appt availability in office) / [x] Appointment(In office/virtual)/ []  [place] [facility]/ [] Home Care/ [] Refused Recommended Disposition /[] [facility]/ []  Follow-up with PCP Additional Notes: Patient calls reporting upper abdominal pain x6 days. States that after she eats symptoms increase- reports pain is 4/10 currently. States she has been taking pepto bismol and pain is relieved with that, but returns later. Per protocol, patient to be evaluated within 4 hours. No availability with PCP or in clinic within guidelines. Patient declined appt today, states her employer will mark points against her if she leaves and requests earliest appt with any provider for tomorrow. Patient scheduled with [facility] SW for3/14/25 at 0800 at her request. Care advice reviewed, patient verbalized understanding and denies further questions at this time. Alerting PCP for review.    Copied from CRM [id number]. Topic: Clinical - Red Word Triage >> Apr 24, 2023  8:33 AM Marlow Baars wrote: Red Word that prompted transfer to Nurse Triage: The patient called in originally to make an appt but after speaking with her need to transfer her to [facility]. She has abdominal pain and heartburn as well as dark stool Reason for Disposition  [1] MILD-MODERATE pain AND [2] constant AND [3] present > 2 hours  Answer Assessment - Initial Assessment Questions 1. LOCATION: "Where does it hurt?"      Upper abdomen 2. RADIATION: "Does the pain shoot anywhere else?" (e.g., chest, back)     Non radiating 3. ONSET: "When did the pain begin?" (e.g., minutes, hours or days ago)      6 days 4. SUDDEN: "Gradual or sudden onset?"     Sudden 5. PATTERN "Does the pain come and go, or is it constant?"    - If it comes and goes: "How long does it last?"  "Do you have pain now?"     (Note: Comes and goes means the pain is intermittent. It goes away completely between bouts.)    - If constant: "Is it getting better, staying the same, or getting worse?"      (Note: Constant means the pain never goes away completely; most serious pain is constant and gets worse.)      Yes, but relieved with pepto bismol 6. SEVERITY: "How bad is the pain?"  (e.g., Scale 1-10; mild, moderate, or severe)    - MILD (1-3): Doesn't interfere with normal activities, abdomen soft and not tender to touch..     - MODERATE (4-7): Interferes with normal activities or awakens from sleep, abdomen tender to touch.     - SEVERE (8-10): Excruciating pain, doubled over, unable to do any normal activities.       4/10 currently, but increases at times 7. RECURRENT SYMPTOM: "Have you ever had this type of stomach pain before?" If Yes, ask: "When was the last time?" and "What happened that time?"      Yes, with gallbladder pain- was removed, but this pain feels similar 8. AGGRAVATING FACTORS: "Does anything seem to cause this pain?" (e.g., foods, stress, alcohol)     Pizza, coffee, 9. CARDIAC SYMPTOMS: "Do you have any of the following symptoms: chest pain, difficulty breathing, sweating, nausea?"     Denies 10. OTHER SYMPTOMS: "Do you have any other symptoms?" (e.g., back pain, diarrhea, fever, urination pain, vomiting)       Heartburn, diarrhea, vomiting (7 times this  week after eating) 11. PREGNANCY: "Is there any chance you are pregnant?" "When was your last menstrual period?"       Denies- hysterectomy  Protocols used: Abdominal Pain - Upper-A-AH

## 2023-04-25 ENCOUNTER — Encounter: Payer: Self-pay | Admitting: Family Medicine

## 2023-04-25 ENCOUNTER — Ambulatory Visit: Admitting: Family Medicine

## 2023-04-25 VITALS — BP 128/82 | HR 90 | Temp 97.5°F | Ht 67.0 in | Wt 289.0 lb

## 2023-04-25 DIAGNOSIS — R197 Diarrhea, unspecified: Secondary | ICD-10-CM

## 2023-04-25 DIAGNOSIS — R112 Nausea with vomiting, unspecified: Secondary | ICD-10-CM | POA: Diagnosis not present

## 2023-04-25 DIAGNOSIS — R1013 Epigastric pain: Secondary | ICD-10-CM | POA: Diagnosis not present

## 2023-04-25 LAB — COMPREHENSIVE METABOLIC PANEL
ALT: 19 U/L (ref 0–35)
AST: 13 U/L (ref 0–37)
Albumin: 4 g/dL (ref 3.5–5.2)
Alkaline Phosphatase: 89 U/L (ref 39–117)
BUN: 15 mg/dL (ref 6–23)
CO2: 28 meq/L (ref 19–32)
Calcium: 9.1 mg/dL (ref 8.4–10.5)
Chloride: 105 meq/L (ref 96–112)
Creatinine, Ser: 0.62 mg/dL (ref 0.40–1.20)
GFR: 106.71 mL/min (ref >60.00)
Glucose, Bld: 102 mg/dL — ABNORMAL HIGH (ref 70–99)
Potassium: 4.7 meq/L (ref 3.5–5.1)
Sodium: 142 meq/L (ref 135–145)
Total Bilirubin: 0.2 mg/dL (ref 0.2–1.2)
Total Protein: 6.2 g/dL (ref 6.0–8.3)

## 2023-04-25 LAB — CBC WITH DIFFERENTIAL/PLATELET
Basophils Absolute: 0 10*3/uL (ref 0.0–0.1)
Basophils Relative: 0.4 % (ref 0.0–3.0)
Eosinophils Absolute: 0.2 10*3/uL (ref 0.0–0.7)
Eosinophils Relative: 2.2 % (ref 0.0–5.0)
HCT: 34.2 % — ABNORMAL LOW (ref 36.0–46.0)
Hemoglobin: 11 g/dL — ABNORMAL LOW (ref 12.0–15.0)
Lymphocytes Relative: 13 % (ref 12.0–46.0)
Lymphs Abs: 1 10*3/uL (ref 0.7–4.0)
MCHC: 32.3 g/dL (ref 30.0–36.0)
MCV: 75.6 fl — ABNORMAL LOW (ref 78.0–100.0)
Monocytes Absolute: 0.2 10*3/uL (ref 0.1–1.0)
Monocytes Relative: 2.9 % — ABNORMAL LOW (ref 3.0–12.0)
Neutro Abs: 6.1 10*3/uL (ref 1.4–7.7)
Neutrophils Relative %: 81.5 % — ABNORMAL HIGH (ref 43.0–77.0)
Platelets: 220 10*3/uL (ref 150.0–400.0)
RBC: 4.52 Mil/uL (ref 3.87–5.11)
RDW: 17.1 % — ABNORMAL HIGH (ref 11.5–15.5)
WBC: 7.4 10*3/uL (ref 4.0–10.5)

## 2023-04-25 LAB — LIPASE: Lipase: 49 U/L (ref 11.0–59.0)

## 2023-04-25 LAB — AMYLASE: Amylase: 37 U/L (ref 27–131)

## 2023-04-25 MED ORDER — PROMETHAZINE HCL 12.5 MG PO TABS: 12.5000 mg | ORAL_TABLET | Freq: Four times a day (QID) | ORAL | 0 refills | Status: DC | PRN

## 2023-04-25 NOTE — Progress Notes (Signed)
 Acute Office Visit  Subjective:     Patient ID: Wendy Maynard, female    DOB: 24-Dec-1975, 48 y.o.   MRN: [id number]  Chief Complaint  Patient presents with   Abdominal Pain     Patient is in today for abdominal discomfort.    Discussed the use of AI scribe software for clinical note transcription with the patient, who gave verbal consent to proceed.  History of Present Illness Wendy Maynard is a 48 year old female who presents with gastrointestinal symptoms including nausea, vomiting, and diarrhea.  She experienced severe heartburn and nausea last Friday night, which led to vomiting and a burning sensation in her stomach. While out of town, she took Pepto-Bismol, which provided some relief, but her symptoms persisted, including diarrhea, vomiting, and a burning stomach. Her stools turned black after taking Pepto-Bismol, but they were not black prior to its use. No daily reflux issues, but she has experienced nausea, vomiting, and diarrhea, with the diarrhea having slowed down. She vomited yesterday after eating bread and has no blood in her vomit or stools.  She started taking omeprazole yesterday and had previously taken Pepcid, but stopped after advice from a nurse. She continues to experience burning in her stomach, which she rates as a 5 out of 10 today, down from an 8. She has adjusted her diet to include bland foods such as potatoes, rice, and bananas, and has reduced her coffee intake. 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  She has a history of gallbladder issues, having had an emergency gallbladder removal ten years ago. Her family history is significant for colon cancer, with her father's sister dying of the disease in her mid-forties, and both great-great-grandmothers on her father's side also having died from it. Her mother had a precancerous colon polyp seven years ago, and her father had benign polyps and  diverticulitis. Her sister has ileitis.  She has a history of sleep paralysis, which was exacerbated by Zofran in the past, but she tolerates Phenergan well.        All review of systems negative except what is listed in the HPI      Objective:    BP 128/82   Pulse 90   Temp (!) 97.5 F (36.4 C) (Oral)   Ht 5\' 7"  (1.702 m)   Wt 289 lb (131.1 kg)   LMP  (LMP Unknown)   SpO2 99%   BMI 45.26 kg/m    Physical Exam Vitals reviewed.  Constitutional:      Appearance: She is well-developed.  HENT:     Head: Normocephalic and atraumatic.  Cardiovascular:     Rate and Rhythm: Normal rate and regular rhythm.  Pulmonary:     Effort: Pulmonary effort is normal.     Breath sounds: Normal breath sounds.  Abdominal:     General: Bowel sounds are normal.     Palpations: Abdomen is soft.     Tenderness: There is no guarding or rebound. Negative signs include Murphy's sign and McBurney's sign.     Comments: Mild generalized pain to palpation   Skin:    General: Skin is warm and dry.  Neurological:     Mental Status: She is alert and oriented to person, place, and time.  Psychiatric:        Mood and Affect: Mood normal.        Behavior: Behavior normal.  No results found for any visits on 04/25/23.      Assessment & Plan:   Problem List Items Addressed This Visit   None Visit Diagnoses       Epigastric abdominal pain    -  Primary   Relevant Medications   promethazine (PHENERGAN) 12.5 MG tablet   Other Relevant Orders   CBC with Differential/Platelet   Comprehensive metabolic panel   Lipase   Amylase   Ambulatory referral to Gastroenterology   H. pylori breath test     Nausea vomiting and diarrhea       Relevant Medications   promethazine (PHENERGAN) 12.5 MG tablet   Other Relevant Orders   CBC with Differential/Platelet   Comprehensive metabolic panel   Lipase   Amylase   Ambulatory referral to Gastroenterology   H. pylori breath test        Assessment & Plan  Symptoms and frequent ibuprofen use suggest gastritis/ulcer. H. pylori infection considered. . Black stools likely from Pepto-Bismol. Discussed H. pylori test interference due to omeprazole. Endoscopy considered if symptoms persist. - Order H. pylori stool test if feasible - will need to be without omeprazole/Pepto for 2 weeks. - Perform labs today - Refer to GI - due for routine colonoscopy, may also need endoscopy if symptoms persist - Prescribe low-dose Phenergan for nausea as needed. - Advise to avoid spicy and acidic foods. - Pepcid as needed. If unable to go 2 weeks without omeprazole/Pepto for H pylori testing, then fine to take and wait for GI workup.   Lifestyle measures for reflux: - Avoid meals or carbonated beverages within 3 hours of bedtime - Minimize intake of fried, fatty, and spicy foods (this will help decrease gastric acid production) - Raise the head of the bed using 4-6 inch blocks (especially if symptoms are present at night) - Maintain a healthy weight and avoid tight fitting clothes, especially around the waist  - Avoid foods that relax the sphincter or worsen symptoms (chocolate, peppermint, fatty foods, citrus, spicy foods, tomatoes, coffee, caffeine)  - Minimize use of NSAIDs (ibuprofen, Aleve, etc), nicotine, and alcohol   Family History of Colon Cancer Colonoscopy recommended for screening and early detection. - Refer to GI for colonoscopy.    Meds ordered this encounter  Medications   promethazine (PHENERGAN) 12.5 MG tablet    Sig: Take 1 tablet (12.5 mg total) by mouth every 6 (six) hours as needed for nausea or vomiting.    Dispense:  30 tablet    Refill:  0    Supervising Provider:   Danise Edge A [[id number]]    Return if symptoms worsen or fail to improve.  Clayborne Dana, NP

## 2023-04-28 ENCOUNTER — Encounter: Payer: Self-pay | Admitting: Family Medicine

## 2023-05-01 DIAGNOSIS — G4733 Obstructive sleep apnea (adult) (pediatric): Secondary | ICD-10-CM | POA: Diagnosis not present

## 2023-05-02 ENCOUNTER — Other Ambulatory Visit (HOSPITAL_BASED_OUTPATIENT_CLINIC_OR_DEPARTMENT_OTHER): Payer: Self-pay

## 2023-05-06 ENCOUNTER — Telehealth (INDEPENDENT_AMBULATORY_CARE_PROVIDER_SITE_OTHER): Payer: Commercial Managed Care - PPO | Admitting: Neurology

## 2023-05-06 DIAGNOSIS — G43709 Chronic migraine without aura, not intractable, without status migrainosus: Secondary | ICD-10-CM | POA: Diagnosis not present

## 2023-05-06 NOTE — Progress Notes (Unsigned)
 [id number]    Provider:  Dr Lucia Gaskins Requesting Provider: Marcine Matar, MD Primary Care Provider:  Marcine Matar, MD  Virtual Visit via Video Note  I connected with Wendy Maynard on 05/06/2023 at  3:00 PM EDT by a video enabled telemedicine application and verified that I am speaking with the correct person using two identifiers.  Location: Patient: HOME Provider: OFFICE   I discussed the limitations of evaluation and management by telemedicine and the availability of in person appointments. The patient expressed understanding and agreed to proceed.   Follow Up Instructions:    I discussed the assessment and treatment plan with the patient. The patient was provided an opportunity to ask questions and all were answered. The patient agreed with the plan and demonstrated an understanding of the instructions.   The patient was advised to call back or seek an in-person evaluation if the symptoms worsen or if the condition fails to improve as anticipated.  I provided OVER 10 minutes of non-face-to-face time during this encounter.   Anson Fret, MD  CC:  migraines  05/06/2023: The Topiramate gave her double vision. But the [name] helped with the migraines. At baseline has > 8 migraine days a month and daily headaches. When she was on the ajovy migraine were great reduced by at least 50%. She is back to having 8 migraine days a month and > 15 headache days a month she needs [name] again we will prescribe.   Prescribe [name] for chronic migraines Follow up in 6 months with NP Wouldn't use qulipta due to constipation  Patient complains of symptoms per HPI as well as the following symptoms: none . Pertinent negatives and positives per HPI. All others negative   07/17/2022 HPI:  Wendy Maynard is a 48 y.o. female here as requested by Marcine Matar, MD for migraine. has Cholecystitis; Chronic calculous cholecystitis; MDD (major depressive disorder), recurrent  severe, without psychosis (HCC); ADHD, predominantly inattentive type; Posttraumatic stress disorder; Snoring; Excessive sleepiness; Fatigue; Insomnia; Sleep paralysis; Migraine with aura and without status migrainosus, not intractable; Chronic pain of right knee; Morbid obesity with body mass index (BMI) of 40.0 to 44.9 in adult [facility]); OSA on CPAP; Postmenopausal hormone replacement therapy; Neck muscle spasm; Elevated blood pressure reading without diagnosis of hypertension; Chronic migraine without aura without status migrainosus, not intractable; and Diabetes mellitus (HCC) on their problem list.  I reviewed emergency room notes.  Patient recently presented at the end of April for history of migraines especially the prior 2 weeks with intermittent left-sided migraines that seem to be worsening and up to 10 out of 10 in pain with some vomiting.  She had some peripheral vision changes and endorsed auras which is not uncommon for her.  She also endorsed some dizziness, photosensitivity and nausea, used to be on Relpax but stopped it because her migraines improved after hysterectomy.  She has tried Excedrin Migraine.  She did do a virtual visit on 417 for headaches as well and she rated her headache as the worst headache of her life and is was advised to go to the ER and she has been to the ER multiple times in the past for headache.  Toradol, Zofran helped with her headache.  Relpax was sent to pharmacy.  She has a FHX in dad and sister. Started in college. Had a histerectomy at 40 and improved. Would get one a month or none for a few years. Started worsening 2020 1-2x a month, then progressively  worsening.  Patient is here and reports that in February her migraines became more frequent, but they had been progressing since the pandemic.  She had an ER visit and was treated with a migraine cocktail.  Not sure how many migraines but this month increased from a possible 1 a month to a few a week, pain is always on  the left side behind the eye sometimes extending all the way through the left side of the head, nausea but do not always vomit, photophobia, phonophobia and a little bit of osmophobia.  In March she documented that she had blurry vision at 130 and at 2 PM the pain started on the left side nausea took Excedrin drink a Coke lasted until 4 PM, exhausted after a trigger was that she did not sleep the night before and she ate sausage biscuit peaches breakfast lunch.  In March the same thing blurry vision, pain, vomiting, exhaustion, took Excedrin, can last a minimum of 4 hours upwards of a day or 2, she had a sandwich for lunch, she also had 1 on March 7 she took Tylenol felt very weak with photophobia and phonophobia, the next migraine similar blurry vision eye twitching, blurry vision appears to be a recurring theme before the migraines, in March she had at least 11 migraine days and in April she had at least 8 migraine days.  Has daily headaches.  She has had over 8 migraine days and daily headaches for more than 6 months.  Sleep helps.  She tries to drink water.  She has tried Tylenol and Excedrin and drinking Cokes and eating crackers.  Sleep helps, going into a dark room helps, from her journal it appears as though she has more migraines when she eats foods with preservatives like sandwiches or sausage but that is unclear.  She always appears to have nausea, photophobia, phonophobia, pulsating pounding throbbing, unilateral, moderate to severe pain, also has pain in her head and neck radiates to the back of the head and in the lower back and it is always exhausting afterwards on occasion she has lots of vomiting.  No aura.  No medication overuse.  She has had look at the emergency room where she has been multiple times with migraine cocktails with Toradol and Zofran.  The emergency room gave her Relpax which has worked in the past.  She also sees boxes prior to her migraines which may be in aura however not all the  time.  And also she has ocular migraines as well without the pain.No other focal neurologic deficits, associated symptoms, inciting events or modifiable factors.   Reviewed notes, labs and imaging from outside physicians, which showed:  Labs in the past included in February 2024 urine drug screen with positive benzos, amphetamines and THC, ethanol negative, BMP with low potassium 3.2 elevated glucose 177 BUN 15 and creatinine 0.91, CBC showed leukocytosis 18.6, slight critically decreased MCV 78.6 with a predominance of neutrophils.  TSH plus free T4 plus T3 free in October 2023 was normal.  Hemoglobin A1c in February 2023 was 6, prediabetes.  Lipids were very elevated cholesterol was 224, LDL was 140 and triglycerides were 173 in February 2023.  I do not see any brain imaging in epic or "Care Everywhere".  From a thorough review of records, Meds tried that can be used in migraine and headache management include Tylenol, Xanax, Abilify, Flexeril, Voltaren tabs, Benadryl, Cymbalta, Relpax, Prozac, gabapentin, hydroxyzine, ketorolac injections, Mobic tablets, Robaxin, Reglan injections, Zofran oral and injections,  oxycodone, Compazine injections, Phenergan injections, propranolol, Seroquel, Zoloft, amitriptyline and nortriptyline contraindicated because she is already on Zoloft and this can cause serotonin syndrome, tramadol, trazodone, imitrex, aimovig contraindicated due to constipation, Topiramate  Review of Systems: Patient complains of symptoms per HPI as well as the following symptoms migraines. Pertinent negatives and positives per HPI. All others negative.   Social History   Socioeconomic History   Marital status: Single    Spouse name: Not on file   Number of children: 0   Years of education: Not on file   Highest education level: Master's degree (e.g., MA, MS, MEng, MEd, MSW, MBA)  Occupational History   Occupation: Retail buyer at [employer]  Tobacco Use   Smoking status: Former     Current packs/day: 0.00    Types: Cigarettes    Quit date: 01/2021    Years since quitting: 2.3   Smokeless tobacco: Never   Tobacco comments:    plans to start nicotine gum  Vaping Use   Vaping status: Never Used  Substance and Sexual Activity   Alcohol use: Not Currently    Comment: Occasionally 1-2 x/mth   Drug use: No    Types: Marijuana    Comment: Last use 04/27/16   Sexual activity: Not Currently    Birth control/protection: Condom  Other Topics Concern   Not on file  Social History Narrative   Not on file   Social Drivers of Health   Financial Resource Strain: Not on file  Food Insecurity: Not on file  Transportation Needs: Not on file  Physical Activity: Not on file  Stress: Not on file  Social Connections: Not on file  Intimate Partner Violence: Not on file    Family History  Problem Relation Age of Onset   Depression Mother        in response to grief/loss of husband   Hypertension Father        Died sudenly at age 50. Reports father may have had Aspergers   Migraines Father    Depression Sister    Anxiety disorder Sister    ADD / ADHD Sister    Migraines Sister    ADD / ADHD Cousin    Asperger's syndrome Cousin     Past Medical History:  Diagnosis Date   Abdominal pain in female patient 11/2012   Anxiety    Depression    Diabetes mellitus without complication (HCC)    Migraines    Polycystic ovarian disease     Patient Active Problem List   Diagnosis Date Noted   Diabetes mellitus (HCC) 04/24/2023   Chronic migraine without aura without status migrainosus, not intractable 07/17/2022   Migraine with aura and without status migrainosus, not intractable 03/29/2021   Chronic pain of right knee 03/29/2021   Morbid obesity with body mass index (BMI) of 40.0 to 44.9 in adult (HCC) 03/29/2021   OSA on CPAP 03/29/2021   Postmenopausal hormone replacement therapy 03/29/2021   Neck muscle spasm 03/29/2021   Elevated blood pressure reading without  diagnosis of hypertension 03/29/2021   Snoring 12/07/2020   Excessive sleepiness 12/07/2020   Fatigue 12/07/2020   Insomnia 12/07/2020   Sleep paralysis 12/07/2020   ADHD, predominantly inattentive type 02/06/2018   Posttraumatic stress disorder 02/06/2018   MDD (major depressive disorder), recurrent severe, without psychosis (HCC) 04/28/2016   Chronic calculous cholecystitis 03/23/2013   Cholecystitis 02/26/2013    Past Surgical History:  Procedure Laterality Date   CHOLECYSTECTOMY N/A 02/26/2013   Procedure: LAPAROSCOPIC  CHOLECYSTECTOMY WITH INTRAOPERATIVE CHOLANGIOGRAM;  Surgeon: Adolph Pollack, MD;  Location: [facility];  Service: General;  Laterality: N/A;   HYSTERECTOMY ABDOMINAL WITH SALPINGECTOMY     WISDOM TOOTH EXTRACTION      Current Outpatient Medications  Medication Sig Dispense Refill   Fremanezumab-vfrm ([name]) 225 MG/1.5ML SOAJ Inject 225 mg into the skin every 30 (thirty) days. Please run copay card: BIN# [id number] PCN# [id number] GRP# [id number] ID# [id number] EXP 02/11/2024 1.5 mL 11   ALPRAZolam (XANAX) 0.5 MG tablet Take 1 tablet (0.5 mg total) by mouth 2 (two) times daily as needed for anxiety. 60 tablet 2   [START ON 05/12/2023] ALPRAZolam (XANAX) 0.5 MG tablet Take 1 tablet (0.5 mg total) by mouth at bedtime as needed for anxiety. 60 tablet 2   amphetamine-dextroamphetamine (ADDERALL) 15 MG tablet Take 1 tablet by mouth 2 (two) times daily. 60 tablet 0   amphetamine-dextroamphetamine (ADDERALL) 15 MG tablet Take 1 tablet by mouth 2 (two) times daily. 60 tablet 0   amphetamine-dextroamphetamine (ADDERALL) 15 MG tablet Take 1 tablet by mouth daily. 60 tablet 0   Ascorbic Acid (VITAMIN C) 100 MG tablet Take 100 mg by mouth daily.     busPIRone (BUSPAR) 15 MG tablet Take 1 tablet (15 mg total) by mouth 2 (two) times daily. 180 tablet 1   CALCIUM PO Take by mouth.     CHELATED MAGNESIUM PO Take by mouth.     cyclobenzaprine (FLEXERIL) 10 MG tablet Take 1 tablet (10 mg total) by  mouth 2 (two) times daily as needed for muscle spasms. 10 tablet 0   estradiol (ESTRACE) 1 MG tablet Take 1 tablet (1 mg total) by mouth daily. 30 tablet 4   estradiol (VIVELLE-DOT) 0.1 MG/24HR patch Place 1 patch (0.1 mg total) onto the skin 2 (two) times a week. 24 patch 3   gabapentin (NEURONTIN) 300 MG capsule Take 1 capsule (300 mg total) by mouth 3 (three) times daily. 90 capsule 5   lidocaine (LIDODERM) 5 % Place 1 patch onto the skin daily. Keep patch on for 12 hours and remove for 12 hours 10 patch 0   Multiple Vitamin (MULTIVITAMIN) tablet Take 1 tablet by mouth daily.     Omega-3 Fatty Acids (FISH OIL ADULT GUMMIES PO) Take by mouth.     ondansetron (ZOFRAN) 4 MG tablet Take 1 tablet (4 mg total) by mouth every 6 (six) hours. 12 tablet 0   promethazine (PHENERGAN) 12.5 MG tablet Take 1 tablet (12.5 mg total) by mouth every 6 (six) hours as needed for nausea or vomiting. 30 tablet 0   QUEtiapine (SEROQUEL) 300 MG tablet Take 1 tablet (300 mg total) by mouth at bedtime. 90 tablet 1   sertraline (ZOLOFT) 100 MG tablet Take 2 tablets (200 mg total) by mouth daily. 180 tablet 1   SUMAtriptan (IMITREX) 100 MG tablet Take 1 tablet (100 mg total) by mouth once as needed for up to 1 dose. May repeat in 2 hours if headache persists or recurs. 10 tablet 12   vitamin B-12 (CYANOCOBALAMIN) 100 MCG tablet Take 100 mcg by mouth daily.     zolpidem (AMBIEN) 10 MG tablet Take 1 tablet (10 mg total) by mouth at bedtime as needed. 30 tablet 2   zolpidem (AMBIEN) 10 MG tablet Take 1 tablet (10 mg total) by mouth at bedtime as needed for sleep. 30 tablet 2   Current Facility-Administered Medications  Medication Dose Route Frequency Provider Last Rate Last Admin   Fremanezumab-vfrm SOSY  225 mg  225 mg Subcutaneous Once Anson Fret, MD        Allergies as of 05/06/2023 - Review Complete 04/25/2023  Allergen Reaction Noted   Ciprofloxacin Hives and Other (See Comments) 02/26/2013    Clindamycin/lincomycin Diarrhea and Other (See Comments) 04/29/2016   Vicodin [hydrocodone-acetaminophen] Itching and Nausea And Vomiting 07/18/2011   Penicillins Rash and Other (See Comments) 07/18/2011   Sulfa antibiotics Rash 07/18/2011    Vitals: LMP  (LMP Unknown)  Last Weight:  Wt Readings from Last 1 Encounters:  04/25/23 289 lb (131.1 kg)   Last Height:   Ht Readings from Last 1 Encounters:  04/25/23 5\' 7"  (1.702 m)    Physical exam: Exam: Gen: NAD, conversant      CV: No palpitations or chest pain or SOB. VS: Breathing at a normal rate. Weight appears overweight. Not febrile. Eyes: Conjunctivae clear without exudates or hemorrhage  Neuro: Detailed Neurologic Exam  Speech:    Speech is normal; fluent and spontaneous with normal comprehension.  Cognition:    The patient is oriented to person, place, and time;     recent and remote memory intact;     language fluent;     normal attention, concentration, fund of knowledge Cranial Nerves:    The pupils are equal, round, and reactive to light. Visual fields are full Extraocular movements are intact.  The face is symmetric with normal sensation. The palate elevates in the midline. Hearing intact. Voice is normal. Shoulder shrug is normal. The tongue has normal motion without fasciculations.   Coordination: normal  Gait:    No abnormalities noted or reported  Motor Observation:   no involuntary movements noted. Tone:    Appears normal  Posture:    Posture is normal. normal erect    Strength:    Strength is anti-gravity and symmetric in the upper and lower limbs.      Sensation: intact to LT, no reports of numbness or tingling or paresthesias         Assessment/Plan:  Patient with chronic migraines  05/06/2023: The Topiramate gave her double vision. But the [name] helped with the migraines. At baseline has > 8 migraine days a month and daily headaches. When she was on the ajovy migraine were great reduced by  at least 50%. She is back to having 8 migraine days a month and > 15 headache days a month she needs [name] again we will prescribe.   Prescribe [name] for chronic migraines Follow up in 6 months with NP Wouldn't use qulipta due to constipation  From a thorough review of records, Meds tried that can be used in migraine and headache management include Tylenol, Xanax, Abilify, Flexeril, Voltaren tabs, Benadryl, Cymbalta, Relpax, Prozac, gabapentin, hydroxyzine, ketorolac injections, Mobic tablets, Robaxin, Reglan injections, Zofran oral and injections, oxycodone, Compazine injections, Phenergan injections, propranolol, Seroquel, Zoloft, amitriptyline and nortriptyline contraindicated because she is already on Zoloft and this can cause serotonin syndrome, tramadol, trazodone, imitrex, aimovig contraindicated due to constipation, Topiramate, Wouldn't use qulipta due to constipation  Acute(as needed): Relpax and try Sumatriptan. If these fail Ubrelvy, Nurtec or Zavzpret Low threshold for MRI brain  w/wo contrast, she declined at this time  Discussed: "There is increased risk for stroke in women with migraine with aura and a contraindication for the combined contraceptive pill for use by women who have migraine with aura. The risk for women with migraine without aura is lower. However other risk factors like smoking are far more likely to  increase stroke risk than migraine. There is a recommendation for no smoking and for the use of OCPs without estrogen such as progestogen only pills particularly for women with migraine with aura.Marland Kitchen People who have migraine headaches with auras may be 3 times more likely to have a stroke caused by a blood clot, compared to migraine patients who don't see auras. Women who take hormone-replacement therapy may be 30 percent more likely to suffer a clot-based stroke than women not taking medication containing estrogen. Other risk factors like smoking and high blood pressure may be   much more important."  Meds ordered this encounter  Medications   Fremanezumab-vfrm ([name]) 225 MG/1.5ML SOAJ    Sig: Inject 225 mg into the skin every 30 (thirty) days. Please run copay card: BIN# [id number] PCN# [id number] GRP# [id number] ID# [id number] EXP 02/11/2024    Dispense:  1.5 mL    Refill:  11    Please run copay card: BIN# [id number] PCN# [id number] GRP# [id number] ID# [id number] EXP 02/11/2024    Cc: Marcine Matar, MD,  Marcine Matar, MD  Naomie Dean, MD  [facility] [address] [place], [place] [address]  Phone [phone] Fax [phone]

## 2023-05-06 NOTE — Patient Instructions (Signed)
 Prescribe [name] for chronic migraines Follow up in 6 months

## 2023-05-07 ENCOUNTER — Other Ambulatory Visit: Payer: Self-pay

## 2023-05-07 ENCOUNTER — Ambulatory Visit (INDEPENDENT_AMBULATORY_CARE_PROVIDER_SITE_OTHER): Payer: Commercial Managed Care - PPO | Admitting: Psychiatry

## 2023-05-07 ENCOUNTER — Encounter: Payer: Self-pay | Admitting: Neurology

## 2023-05-07 ENCOUNTER — Other Ambulatory Visit (HOSPITAL_BASED_OUTPATIENT_CLINIC_OR_DEPARTMENT_OTHER): Payer: Self-pay

## 2023-05-07 DIAGNOSIS — F4310 Post-traumatic stress disorder, unspecified: Secondary | ICD-10-CM

## 2023-05-07 MED ORDER — AJOVY 225 MG/1.5ML ~~LOC~~ SOAJ
225.0000 mg | SUBCUTANEOUS | 11 refills | Status: DC
  Filled 2023-05-07 (×2): qty 1.5, 30d supply, fill #0
  Filled 2023-06-03: qty 1.5, 30d supply, fill #1
  Filled 2023-07-21: qty 1.5, 30d supply, fill #2
  Filled 2023-10-08: qty 1.5, 30d supply, fill #3
  Filled 2023-11-03: qty 1.5, 30d supply, fill #4
  Filled 2023-12-02 (×2): qty 1.5, 30d supply, fill #5

## 2023-05-07 NOTE — Progress Notes (Unsigned)
 [facility] Counselor/Therapist Progress Note  Patient ID: Wendy Maynard, MRN: [id number],    Date: 05/07/2023  Time Spent: 46 minutes start time 5:01 PM end time 5:48 PM  Treatment Type: Individual Therapy  Reported Symptoms: sadness, anxiety, triggered responses, migraines, health issues, sleep issues, nightmares, panic  Mental Status Exam:  Appearance:   Casual     Behavior:  Appropriate  Motor:  Normal  Speech/Language:   Normal Rate  Affect:  Appropriate  Mood:  normal  Thought process:  normal  Thought content:    WNL  Sensory/Perceptual disturbances:    WNL  Orientation:  oriented to person, place, time/date, and situation  Attention:  Good  Concentration:  Good  Memory:  WNL  Fund of knowledge:   Good  Insight:    Good  Judgment:   Good  Impulse Control:  Good   Risk Assessment: Danger to Self:  No Self-injurious Behavior: No Danger to Others: No Duty to Warn:no Physical Aggression / Violence:No  Access to Firearms a concern: No  Gang Involvement:No   Subjective: Patient was present for session. She shared that she may have a stomach ulcer and she is going through testing. She shared her sister hasn't been talking to her for a year and she went out to dinner again with her and her husband.  She shared she is realizing it is her issue. She doesn't have anything to do with her mom also. She shared she got to go to the rehab center and spoke. She shared that was a great way for to celebrate. She has started getting more involved with things recently which has helped her mood some. She did write a letter to her sister and destroyed it which was a good release for her.  Allowed patient time to discuss the situation with her sister.  She explained the history and how she is her younger sister and she has worked to try and take care of her even when it has been very difficult but since she has gotten married she has become more distant from she and her mother until it is  to the point where it is currently.  Encouraged patient to continue talking to her mother about plans for the future since currently sister is primary care and has told her that if something happens to mom she would be putting her into a facility and leaving her there.  Patient shared that it was very difficult for her to hear that and would not do well if that were to happen and since she seems to be her mother's primary caretaker at this point it may be good to get everything changed in writing while her mother is still able to do so mentally.  Patient was also encouraged to recognize her support network and the things going on in her life that are positive and she continue pursuing those options so that she can feel better about the support network she has.  Interventions: Solution-Oriented/Positive Psychology and Insight-Oriented  Diagnosis:   ICD-10-CM   1. Posttraumatic stress disorder  F43.10       Plan:  Patient is to work on using coping skills more regularly to try and stay grounded.  Patient is to follow plans from session to talk with her mother about what things need to be taking care of for their future so that her anxiety can decrease with the current situation.  Patient is to practice self spotting exercise to calm herself.  Patient  is to keep herself engaged in positive activities to decrease rumination on triggered responses.  Patient is to work on journaling to release negative emotions appropriately.  Patient is to work on using theta music to go to sleep at night.  Patient is to take medication as directed.   Stevphen Meuse, [facility]

## 2023-05-08 ENCOUNTER — Other Ambulatory Visit (HOSPITAL_BASED_OUTPATIENT_CLINIC_OR_DEPARTMENT_OTHER): Payer: Self-pay

## 2023-05-09 ENCOUNTER — Other Ambulatory Visit (INDEPENDENT_AMBULATORY_CARE_PROVIDER_SITE_OTHER)

## 2023-05-09 ENCOUNTER — Telehealth: Payer: Self-pay

## 2023-05-09 ENCOUNTER — Other Ambulatory Visit (HOSPITAL_COMMUNITY): Payer: Self-pay

## 2023-05-09 DIAGNOSIS — R1013 Epigastric pain: Secondary | ICD-10-CM | POA: Diagnosis not present

## 2023-05-09 DIAGNOSIS — R112 Nausea with vomiting, unspecified: Secondary | ICD-10-CM | POA: Diagnosis not present

## 2023-05-09 DIAGNOSIS — R197 Diarrhea, unspecified: Secondary | ICD-10-CM | POA: Diagnosis not present

## 2023-05-09 NOTE — Telephone Encounter (Signed)
 Pharmacy Patient Advocate Encounter   Received notification from Fax that prior authorization for [name] (fremanezumab-vfrm) injection 225MG /1.5ML auto-injectors is required/requested.   Insurance verification completed.   The patient is insured through [facility] .   Per test claim: PA required; PA submitted to above mentioned insurance via CoverMyMeds Key/confirmation #/[id number] Status is pending

## 2023-05-11 LAB — H. PYLORI BREATH TEST: H. pylori Breath Test: NOT DETECTED

## 2023-05-12 ENCOUNTER — Other Ambulatory Visit (HOSPITAL_BASED_OUTPATIENT_CLINIC_OR_DEPARTMENT_OTHER): Payer: Self-pay

## 2023-05-12 ENCOUNTER — Encounter (HOSPITAL_BASED_OUTPATIENT_CLINIC_OR_DEPARTMENT_OTHER): Payer: Self-pay

## 2023-05-12 ENCOUNTER — Encounter: Payer: Self-pay | Admitting: Family Medicine

## 2023-05-13 ENCOUNTER — Other Ambulatory Visit (HOSPITAL_BASED_OUTPATIENT_CLINIC_OR_DEPARTMENT_OTHER): Payer: Self-pay

## 2023-05-13 ENCOUNTER — Other Ambulatory Visit: Payer: Self-pay

## 2023-05-13 ENCOUNTER — Other Ambulatory Visit: Payer: Self-pay | Admitting: Psychiatry

## 2023-05-13 ENCOUNTER — Telehealth: Payer: Self-pay | Admitting: Psychiatry

## 2023-05-13 DIAGNOSIS — F900 Attention-deficit hyperactivity disorder, predominantly inattentive type: Secondary | ICD-10-CM

## 2023-05-13 NOTE — Telephone Encounter (Signed)
 Memorie called at 1:20 to request refill of her Adderall 15mg  2/day. She is following Shanda Bumps to [facility] and has appt 5/5.  She is requesting a refill for April to get her to her appt with Shanda Bumps.  Send to  [facility] Phone: [phone]

## 2023-05-13 NOTE — Telephone Encounter (Signed)
Pended Adderall 

## 2023-05-14 ENCOUNTER — Encounter: Payer: Self-pay | Admitting: Gastroenterology

## 2023-05-14 ENCOUNTER — Other Ambulatory Visit (HOSPITAL_BASED_OUTPATIENT_CLINIC_OR_DEPARTMENT_OTHER): Payer: Self-pay

## 2023-05-14 MED ORDER — AMPHETAMINE-DEXTROAMPHETAMINE 15 MG PO TABS
15.0000 mg | ORAL_TABLET | Freq: Two times a day (BID) | ORAL | 0 refills | Status: AC
  Filled 2023-05-14 – 2023-08-10 (×2): qty 60, 30d supply, fill #0

## 2023-05-14 MED ORDER — ALPRAZOLAM 0.5 MG PO TABS
0.5000 mg | ORAL_TABLET | Freq: Two times a day (BID) | ORAL | 2 refills | Status: DC | PRN
  Filled 2023-05-19 – 2023-05-26 (×4): qty 60, 30d supply, fill #0
  Filled 2023-06-17 – 2023-06-23 (×3): qty 60, 30d supply, fill #1
  Filled 2023-07-21 (×2): qty 60, 30d supply, fill #2
  Filled ????-??-?? (×2): fill #2

## 2023-05-14 MED ORDER — AMPHETAMINE-DEXTROAMPHETAMINE 15 MG PO TABS
1.0000 | ORAL_TABLET | Freq: Two times a day (BID) | ORAL | 0 refills | Status: DC
  Filled 2023-05-14: qty 60, 30d supply, fill #0

## 2023-05-15 ENCOUNTER — Other Ambulatory Visit (HOSPITAL_COMMUNITY): Payer: Self-pay

## 2023-05-15 ENCOUNTER — Other Ambulatory Visit: Payer: Self-pay

## 2023-05-15 NOTE — Telephone Encounter (Signed)
 Pharmacy Patient Advocate Encounter  Received notification from [facility] that Prior Authorization for [name] (fremanezumab-vfrm) injection 225MG /1.5ML auto-injectors has been APPROVED from 05/12/2023 to 11/08/2023. Ran test claim, Copay is $24.98. This test claim was processed through [facility]- copay amounts may vary at other pharmacies due to pharmacy/plan contracts, or as the patient moves through the different stages of their insurance plan.   PA #/Case ID/Reference #: PA Case ID #: [id number]

## 2023-05-19 ENCOUNTER — Other Ambulatory Visit: Payer: Self-pay

## 2023-05-19 ENCOUNTER — Encounter (HOSPITAL_BASED_OUTPATIENT_CLINIC_OR_DEPARTMENT_OTHER): Payer: Self-pay

## 2023-05-19 ENCOUNTER — Other Ambulatory Visit (HOSPITAL_BASED_OUTPATIENT_CLINIC_OR_DEPARTMENT_OTHER): Payer: Self-pay

## 2023-05-20 ENCOUNTER — Encounter: Payer: Self-pay | Admitting: Neurology

## 2023-05-20 ENCOUNTER — Other Ambulatory Visit: Payer: Self-pay

## 2023-05-23 ENCOUNTER — Encounter: Payer: Self-pay | Admitting: Family Medicine

## 2023-05-23 ENCOUNTER — Ambulatory Visit (INDEPENDENT_AMBULATORY_CARE_PROVIDER_SITE_OTHER): Admitting: Family Medicine

## 2023-05-23 ENCOUNTER — Other Ambulatory Visit (HOSPITAL_BASED_OUTPATIENT_CLINIC_OR_DEPARTMENT_OTHER): Payer: Self-pay

## 2023-05-23 VITALS — BP 127/86 | HR 97 | Ht 67.0 in | Wt 279.0 lb

## 2023-05-23 DIAGNOSIS — E538 Deficiency of other specified B group vitamins: Secondary | ICD-10-CM

## 2023-05-23 DIAGNOSIS — G43109 Migraine with aura, not intractable, without status migrainosus: Secondary | ICD-10-CM | POA: Diagnosis not present

## 2023-05-23 DIAGNOSIS — F4310 Post-traumatic stress disorder, unspecified: Secondary | ICD-10-CM | POA: Diagnosis not present

## 2023-05-23 DIAGNOSIS — R52 Pain, unspecified: Secondary | ICD-10-CM | POA: Diagnosis not present

## 2023-05-23 DIAGNOSIS — G8929 Other chronic pain: Secondary | ICD-10-CM

## 2023-05-23 DIAGNOSIS — F900 Attention-deficit hyperactivity disorder, predominantly inattentive type: Secondary | ICD-10-CM | POA: Diagnosis not present

## 2023-05-23 DIAGNOSIS — F332 Major depressive disorder, recurrent severe without psychotic features: Secondary | ICD-10-CM | POA: Diagnosis not present

## 2023-05-23 DIAGNOSIS — Z79890 Hormone replacement therapy (postmenopausal): Secondary | ICD-10-CM

## 2023-05-23 DIAGNOSIS — Z6841 Body Mass Index (BMI) 40.0 and over, adult: Secondary | ICD-10-CM | POA: Diagnosis not present

## 2023-05-23 DIAGNOSIS — D563 Thalassemia minor: Secondary | ICD-10-CM | POA: Insufficient documentation

## 2023-05-23 DIAGNOSIS — M2550 Pain in unspecified joint: Secondary | ICD-10-CM

## 2023-05-23 DIAGNOSIS — R739 Hyperglycemia, unspecified: Secondary | ICD-10-CM | POA: Diagnosis not present

## 2023-05-23 DIAGNOSIS — Z0000 Encounter for general adult medical examination without abnormal findings: Secondary | ICD-10-CM

## 2023-05-23 DIAGNOSIS — G4733 Obstructive sleep apnea (adult) (pediatric): Secondary | ICD-10-CM

## 2023-05-23 LAB — LIPID PANEL
Cholesterol: 226 mg/dL — ABNORMAL HIGH (ref 0–200)
HDL: 52.5 mg/dL (ref >39.00)
LDL Cholesterol: 134 mg/dL — ABNORMAL HIGH (ref 0–99)
NonHDL: 173.27
Total CHOL/HDL Ratio: 4
Triglycerides: 194 mg/dL — ABNORMAL HIGH (ref 0.0–149.0)
VLDL: 38.8 mg/dL (ref 0.0–40.0)

## 2023-05-23 LAB — IBC + FERRITIN
Ferritin: 27.5 ng/mL (ref 10.0–291.0)
Iron: 34 ug/dL — ABNORMAL LOW (ref 42–145)
Saturation Ratios: 8.7 % — ABNORMAL LOW (ref 20.0–50.0)
TIBC: 392 ug/dL (ref 250.0–450.0)
Transferrin: 280 mg/dL (ref 212.0–360.0)

## 2023-05-23 LAB — CBC WITH DIFFERENTIAL/PLATELET
Basophils Absolute: 0 10*3/uL (ref 0.0–0.1)
Basophils Relative: 0.3 % (ref 0.0–3.0)
Eosinophils Absolute: 0.2 10*3/uL (ref 0.0–0.7)
Eosinophils Relative: 2.5 % (ref 0.0–5.0)
HCT: 38.1 % (ref 36.0–46.0)
Hemoglobin: 12.5 g/dL (ref 12.0–15.0)
Lymphocytes Relative: 13.6 % (ref 12.0–46.0)
Lymphs Abs: 1.2 10*3/uL (ref 0.7–4.0)
MCHC: 32.7 g/dL (ref 30.0–36.0)
MCV: 74.1 fl — ABNORMAL LOW (ref 78.0–100.0)
Monocytes Absolute: 0.3 10*3/uL (ref 0.1–1.0)
Monocytes Relative: 3.1 % (ref 3.0–12.0)
Neutro Abs: 7.2 10*3/uL (ref 1.4–7.7)
Neutrophils Relative %: 80.5 % — ABNORMAL HIGH (ref 43.0–77.0)
Platelets: 267 10*3/uL (ref 150.0–400.0)
RBC: 5.14 Mil/uL — ABNORMAL HIGH (ref 3.87–5.11)
RDW: 16.5 % — ABNORMAL HIGH (ref 11.5–15.5)
WBC: 9 10*3/uL (ref 4.0–10.5)

## 2023-05-23 LAB — COMPREHENSIVE METABOLIC PANEL WITH GFR
ALT: 17 U/L (ref 0–35)
AST: 16 U/L (ref 0–37)
Albumin: 4.9 g/dL (ref 3.5–5.2)
Alkaline Phosphatase: 92 U/L (ref 39–117)
BUN: 16 mg/dL (ref 6–23)
CO2: 28 meq/L (ref 19–32)
Calcium: 9.6 mg/dL (ref 8.4–10.5)
Chloride: 101 meq/L (ref 96–112)
Creatinine, Ser: 0.7 mg/dL (ref 0.40–1.20)
GFR: 103.58 mL/min (ref >60.00)
Glucose, Bld: 100 mg/dL — ABNORMAL HIGH (ref 70–99)
Potassium: 4.4 meq/L (ref 3.5–5.1)
Sodium: 140 meq/L (ref 135–145)
Total Bilirubin: 0.3 mg/dL (ref 0.2–1.2)
Total Protein: 7 g/dL (ref 6.0–8.3)

## 2023-05-23 LAB — HEMOGLOBIN A1C: Hgb A1c MFr Bld: 6 % (ref 4.6–6.5)

## 2023-05-23 LAB — B12 AND FOLATE PANEL
Folate: 17.7 ng/mL (ref >5.9)
Vitamin B-12: 273 pg/mL (ref 211–911)

## 2023-05-23 LAB — TSH: TSH: 3.03 u[IU]/mL (ref 0.35–5.50)

## 2023-05-23 MED ORDER — METAXALONE 800 MG PO TABS
800.0000 mg | ORAL_TABLET | Freq: Three times a day (TID) | ORAL | 1 refills | Status: DC | PRN
  Filled 2023-05-23: qty 30, 10d supply, fill #0
  Filled 2023-06-16: qty 30, 10d supply, fill #1

## 2023-05-23 NOTE — Assessment & Plan Note (Signed)
 No SI/HI. Stable. Following with Psych NP. No new concerns.  Currently taking: PRN Xanax, Buspar 15 mg BID, Seroquel 300 mg nightly, Zoloft 100 mg daily

## 2023-05-23 NOTE — Assessment & Plan Note (Signed)
 Uses CPAP nightly without issues.

## 2023-05-23 NOTE — Assessment & Plan Note (Signed)
 Stable. Following with Psych NP

## 2023-05-23 NOTE — Assessment & Plan Note (Signed)
 Weight-related issues include joint pain, fatigue, and swelling. Previous weight loss attempts with Wegovy failed due to adverse effects. Interested in [place] or Zepbound based on A1c levels. Considering DASH diet and salt reduction. Limited insurance coverage for weight loss medications.  - Order A1c test - Discuss referral to [facility] if not diabetic. - Encourage low carb heart healthy diet with portion control and regular exercise.

## 2023-05-23 NOTE — Assessment & Plan Note (Signed)
 Managed with [name] and sumatriptan. Adverse effects with Topamax. Current regimen effective. Following with neuro

## 2023-05-23 NOTE — Assessment & Plan Note (Signed)
 Following with [facility] - doing well with estrogen patches.

## 2023-05-23 NOTE — Progress Notes (Signed)
 New Patient Office Visit  Subjective    Patient ID: Chequita Mofield, female    DOB: 03-05-1975  Age: 48 y.o. MRN: [id number]  CC:  Chief Complaint  Patient presents with   Establish Care    HPI Angella Janowicz presents to establish care   Discussed the use of AI scribe software for clinical note transcription with the patient, who gave verbal consent to proceed.  History of Present Illness Aizza Kreis is a 48 year old female who presents as a new patient with chronic pain and weight issues.  She experiences chronic pain in her neck, back, hands, and feet, which she attributes to her weight. The pain is sometimes burning, particularly in her back, and her hands develop nodules that become painful by the end of the day. She uses Tylenol and occasionally muscle relaxers for pain management, as she cannot take frequent NSAIDs due to stomach issues.  She struggles with weight, currently weighing nearly 300 pounds, which she attributes to factors including her hysterectomy and medication use, such as Seroquel. She previously tried [place] for weight loss but experienced significant illness. She is interested in exploring other weight loss options, including dietary changes and possibly medications like [place] or Zepbound, depending on her lab results and insurance.  She has a history of anemia and was advised against taking iron supplements post-hysterectomy - possibly related to history of thalassemia minor, but she is unsure. She has been eating more steak to increase dietary iron intake. She also mentions a family history of thalassemia minor.  Her past medical history includes a hysterectomy due to polycystic endometriosis, Lyme disease at age 89 treated with tetracycline, sleep apnea for which she uses a CPAP machine nightly, and migraines with aura managed with [name] and sumatriptan as needed (Dr. Lucia Gaskins). She has a history of PTSD and anxiety, managed with medications including Seroquel, Zoloft,  Xanax, and Buspar. She is currently reducing her Zoloft dose from 200 mg to 100 mg, which has improved her hot flashes and headaches.  She works as a Development worker, international aid for [employer], previously a Advice worker and history Runner, broadcasting/film/video. She is considering pursuing a master's in public health.       Outpatient Encounter Medications as of 05/23/2023  Medication Sig   ALPRAZolam (XANAX) 0.5 MG tablet Take 1 tablet (0.5 mg total) by mouth at bedtime as needed for anxiety.   ALPRAZolam (XANAX) 0.5 MG tablet Take 1 tablet (0.5 mg total) by mouth 2 (two) times daily as needed for anxiety.   amphetamine-dextroamphetamine (ADDERALL) 15 MG tablet Take 1 tablet by mouth daily.   amphetamine-dextroamphetamine (ADDERALL) 15 MG tablet Take 1 tablet by mouth 2 (two) times daily.   amphetamine-dextroamphetamine (ADDERALL) 15 MG tablet Take 1 tablet by mouth 2 (two) times daily.   Ascorbic Acid (VITAMIN C) 100 MG tablet Take 100 mg by mouth daily.   busPIRone (BUSPAR) 15 MG tablet Take 1 tablet (15 mg total) by mouth 2 (two) times daily.   CALCIUM PO Take by mouth.   CHELATED MAGNESIUM PO Take by mouth.   estradiol (ESTRACE) 1 MG tablet Take 1 tablet (1 mg total) by mouth daily.   estradiol (VIVELLE-DOT) 0.1 MG/24HR patch Place 1 patch (0.1 mg total) onto the skin 2 (two) times a week.   Fremanezumab-vfrm ([name]) 225 MG/1.5ML SOAJ Inject 225 mg into the skin every 30 (thirty) days.   gabapentin (NEURONTIN) 300 MG capsule Take 1 capsule (300 mg total) by mouth 3 (three) times daily.  lidocaine (LIDODERM) 5 % Place 1 patch onto the skin daily. Keep patch on for 12 hours and remove for 12 hours   metaxalone (SKELAXIN) 800 MG tablet Take 1 tablet (800 mg total) by mouth 3 (three) times daily as needed for muscle spasms.   Multiple Vitamin (MULTIVITAMIN) tablet Take 1 tablet by mouth daily.   Omega-3 Fatty Acids (FISH OIL ADULT GUMMIES PO) Take by mouth.   ondansetron (ZOFRAN) 4 MG tablet Take 1 tablet (4 mg  total) by mouth every 6 (six) hours.   promethazine (PHENERGAN) 12.5 MG tablet Take 1 tablet (12.5 mg total) by mouth every 6 (six) hours as needed for nausea or vomiting.   QUEtiapine (SEROQUEL) 300 MG tablet Take 1 tablet (300 mg total) by mouth at bedtime.   sertraline (ZOLOFT) 100 MG tablet Take 2 tablets (200 mg total) by mouth daily.   SUMAtriptan (IMITREX) 100 MG tablet Take 1 tablet (100 mg total) by mouth once as needed for up to 1 dose. May repeat in 2 hours if headache persists or recurs.   vitamin B-12 (CYANOCOBALAMIN) 100 MCG tablet Take 100 mcg by mouth daily.   zolpidem (AMBIEN) 10 MG tablet Take 1 tablet (10 mg total) by mouth at bedtime as needed.   zolpidem (AMBIEN) 10 MG tablet Take 1 tablet (10 mg total) by mouth at bedtime as needed for sleep.   [DISCONTINUED] amphetamine-dextroamphetamine (ADDERALL) 15 MG tablet Take 1 tablet by mouth 2 (two) times daily.   [DISCONTINUED] cyclobenzaprine (FLEXERIL) 10 MG tablet Take 1 tablet (10 mg total) by mouth 2 (two) times daily as needed for muscle spasms.   Facility-Administered Encounter Medications as of 05/23/2023  Medication   Fremanezumab-vfrm SOSY 225 mg    Past Medical History:  Diagnosis Date   Abdominal pain in female patient 11/11/2012   Anxiety    Depression    Diabetes mellitus without complication (HCC)    Lyme disease    ~age 38   Migraines    Polycystic ovarian disease     Past Surgical History:  Procedure Laterality Date   CHOLECYSTECTOMY N/A 02/26/2013   Procedure: LAPAROSCOPIC CHOLECYSTECTOMY WITH INTRAOPERATIVE CHOLANGIOGRAM;  Surgeon: Adolph Pollack, MD;  Location: [facility];  Service: General;  Laterality: N/A;   HYSTERECTOMY ABDOMINAL WITH SALPINGECTOMY     WISDOM TOOTH EXTRACTION      Family History  Problem Relation Age of Onset   Depression Mother        in response to grief/loss of husband   Hypertension Father        Died sudenly at age 36. Reports father may have had Aspergers    Migraines Father    Depression Sister    Anxiety disorder Sister    ADD / ADHD Sister    Migraines Sister    ADD / ADHD Cousin    Asperger's syndrome Cousin     Social History   Socioeconomic History   Marital status: Single    Spouse name: Not on file   Number of children: 0   Years of education: Not on file   Highest education level: Master's degree (e.g., MA, MS, MEng, MEd, MSW, MBA)  Occupational History   Occupation: Retail buyer at [employer]  Tobacco Use   Smoking status: Former    Current packs/day: 0.00    Types: Cigarettes    Quit date: 01/2021    Years since quitting: 2.3   Smokeless tobacco: Never   Tobacco comments:    plans to start nicotine  gum  Vaping Use   Vaping status: Never Used  Substance and Sexual Activity   Alcohol use: Not Currently    Comment: Occasionally 1-2 x/mth   Drug use: No    Types: Marijuana    Comment: Last use 04/27/16   Sexual activity: Not Currently    Birth control/protection: Condom  Other Topics Concern   Not on file  Social History Narrative   Not on file   Social Drivers of Health   Financial Resource Strain: Not on file  Food Insecurity: Not on file  Transportation Needs: Not on file  Physical Activity: Not on file  Stress: Not on file  Social Connections: Not on file  Intimate Partner Violence: Not on file    ROS All review of systems negative except what is listed in the HPI      Objective    BP 127/86   Pulse 97   Ht 5\' 7"  (1.702 m)   Wt 279 lb (126.6 kg)   LMP  (LMP Unknown)   SpO2 97%   BMI 43.70 kg/m   Physical Exam Vitals reviewed.  Constitutional:      Appearance: Normal appearance. She is obese.  Cardiovascular:     Rate and Rhythm: Normal rate and regular rhythm.  Pulmonary:     Effort: Pulmonary effort is normal.     Breath sounds: Normal breath sounds.  Skin:    General: Skin is warm and dry.  Neurological:     Mental Status: She is alert and oriented to person, place, and time.   Psychiatric:        Mood and Affect: Mood normal.        Behavior: Behavior normal.        Thought Content: Thought content normal.        Judgment: Judgment normal.         Assessment & Plan:   Problem List Items Addressed This Visit       Active Problems   MDD (major depressive disorder), recurrent severe, without psychosis (HCC)   No SI/HI. Stable. Following with Psych NP. No new concerns.  Currently taking: PRN Xanax, Buspar 15 mg BID, Seroquel 300 mg nightly, Zoloft 100 mg daily      ADHD, predominantly inattentive type   Stable. Following with Psych NP      Posttraumatic stress disorder   Stable. Following with Psych NP      Migraine with aura and without status migrainosus, not intractable   Managed with [name] and sumatriptan. Adverse effects with Topamax. Current regimen effective. Following with neuro       Relevant Medications   metaxalone (SKELAXIN) 800 MG tablet   Morbid obesity with body mass index (BMI) of 40.0 to 44.9 in adult [facility])   Weight-related issues include joint pain, fatigue, and swelling. Previous weight loss attempts with Wegovy failed due to adverse effects. Interested in [place] or Zepbound based on A1c levels. Considering DASH diet and salt reduction. Limited insurance coverage for weight loss medications.  - Order A1c test - Discuss referral to [facility] if not diabetic. - Encourage low carb heart healthy diet with portion control and regular exercise.       Relevant Orders   Comprehensive metabolic panel with GFR   Lipid panel   TSH   OSA on CPAP   Uses CPAP nightly without issues.      Postmenopausal hormone replacement therapy   Following with [facility] - doing  well with estrogen patches.       Thalassemia minor   Reports history of Thalassemia minor with no concerns       Relevant Orders   CBC with Differential/Platelet   IBC + Ferritin   Hyperglycemia - Primary   Lab Results   Component Value Date   HGBA1C 6.0 (H) 03/29/2021   Repeat labs today. Lifestyle measures encouraged.       Relevant Orders   Hemoglobin A1c   B12 deficiency   Labs today       Relevant Orders   B12 and Folate Panel   Other Visit Diagnoses       Encounter for medical examination to establish care         Polyarthralgia     Chronic pain in neck, back, hands, and feet likely exacerbated by obesity. Discontinued NSAIDs due to GI concerns. Tylenol and gabapentin provide some relief. Rheumatoid arthritis considered; rheumatology referral if indicated. Interested in non-narcotic options. Skelaxin discussed with caution due to SSRI interaction. - Order rheumatoid arthritis panel. - Prescribe Skelaxin, advise sparing use due to SSRI interaction. - Consider orthopedic referral if blood work inconclusive. - Recommend Voltaren gel for joint pain. - Discuss physical therapy benefits.    Relevant Orders   Antinuclear Antib (ANA)   Rheumatoid Factor     Chronic generalized pain     See above   Relevant Medications   metaxalone (SKELAXIN) 800 MG tablet   Other Relevant Orders   Antinuclear Antib (ANA)   Rheumatoid Factor       Return in about 6 months (around 11/22/2023) for routine follow-up.   Clayborne Dana, NP

## 2023-05-23 NOTE — Assessment & Plan Note (Signed)
 Lab Results  Component Value Date   HGBA1C 6.0 (H) 03/29/2021   Repeat labs today. Lifestyle measures encouraged.

## 2023-05-23 NOTE — Assessment & Plan Note (Signed)
 Reports history of Thalassemia minor with no concerns

## 2023-05-23 NOTE — Assessment & Plan Note (Signed)
 Labs today

## 2023-05-25 LAB — ANA: Anti Nuclear Antibody (ANA): NEGATIVE

## 2023-05-25 LAB — RHEUMATOID FACTOR: Rheumatoid fact SerPl-aCnc: 10 [IU]/mL (ref <14)

## 2023-05-26 ENCOUNTER — Other Ambulatory Visit (HOSPITAL_BASED_OUTPATIENT_CLINIC_OR_DEPARTMENT_OTHER): Payer: Self-pay

## 2023-05-27 DIAGNOSIS — Z0289 Encounter for other administrative examinations: Secondary | ICD-10-CM

## 2023-05-27 NOTE — Telephone Encounter (Signed)
FMLA form completed, signed and sent to medical records for processing.

## 2023-05-28 ENCOUNTER — Encounter: Payer: Self-pay | Admitting: Family Medicine

## 2023-05-28 ENCOUNTER — Telehealth: Payer: Self-pay | Admitting: *Deleted

## 2023-05-28 NOTE — Telephone Encounter (Signed)
 [employer] form faxed on 05/28/2023

## 2023-05-28 NOTE — Progress Notes (Signed)
 Mentzer's Index = 14.4%, low iron  Low Iron: Start taking Ferrous Sulfate 325 mg (over-the-counter) every other day. Take with Vitamin C to enhance absorption. Take it at least 1-2 hours after tea, coffee, milk, eggs, and other medications. You may need to take a stool softener because iron supplements can cause constipation. Repeat CBC and iron panel in 3 months.   B12 borderline line low - start taking over-the-counter oral B12 1,000 mcg daily 3-4 times per week

## 2023-06-03 ENCOUNTER — Other Ambulatory Visit: Payer: Self-pay

## 2023-06-13 ENCOUNTER — Other Ambulatory Visit: Payer: Self-pay

## 2023-06-13 ENCOUNTER — Other Ambulatory Visit (HOSPITAL_BASED_OUTPATIENT_CLINIC_OR_DEPARTMENT_OTHER): Payer: Self-pay

## 2023-06-13 DIAGNOSIS — G4700 Insomnia, unspecified: Secondary | ICD-10-CM | POA: Diagnosis not present

## 2023-06-13 DIAGNOSIS — F4310 Post-traumatic stress disorder, unspecified: Secondary | ICD-10-CM | POA: Diagnosis not present

## 2023-06-13 DIAGNOSIS — F909 Attention-deficit hyperactivity disorder, unspecified type: Secondary | ICD-10-CM | POA: Diagnosis not present

## 2023-06-13 DIAGNOSIS — F339 Major depressive disorder, recurrent, unspecified: Secondary | ICD-10-CM | POA: Diagnosis not present

## 2023-06-13 DIAGNOSIS — F411 Generalized anxiety disorder: Secondary | ICD-10-CM | POA: Diagnosis not present

## 2023-06-13 MED ORDER — SERTRALINE HCL 100 MG PO TABS
100.0000 mg | ORAL_TABLET | Freq: Two times a day (BID) | ORAL | 1 refills | Status: AC
  Filled 2023-06-13 – 2023-07-13 (×3): qty 180, 90d supply, fill #0
  Filled 2023-10-08: qty 180, 90d supply, fill #1

## 2023-06-13 MED ORDER — ALPRAZOLAM 0.5 MG PO TABS
0.5000 mg | ORAL_TABLET | Freq: Two times a day (BID) | ORAL | 5 refills | Status: AC
  Filled 2023-08-10 – 2023-08-18 (×5): qty 60, 30d supply, fill #0
  Filled 2023-10-08: qty 60, 30d supply, fill #1
  Filled 2023-11-21: qty 60, 30d supply, fill #2

## 2023-06-13 MED ORDER — BUSPIRONE HCL 15 MG PO TABS
15.0000 mg | ORAL_TABLET | Freq: Two times a day (BID) | ORAL | 1 refills | Status: AC
  Filled 2023-06-13: qty 180, 90d supply, fill #0

## 2023-06-13 MED ORDER — ZOLPIDEM TARTRATE 10 MG PO TABS
10.0000 mg | ORAL_TABLET | Freq: Every day | ORAL | 5 refills | Status: DC
  Filled 2023-06-16 (×2): qty 30, 30d supply, fill #0
  Filled 2023-06-25 – 2023-07-13 (×2): qty 30, 30d supply, fill #1
  Filled 2023-08-05 – 2023-08-06 (×4): qty 30, 30d supply, fill #2
  Filled 2023-08-26 – 2023-09-03 (×2): qty 30, 30d supply, fill #3
  Filled 2023-09-18 – 2023-10-01 (×3): qty 30, 30d supply, fill #4
  Filled 2023-10-15 – 2023-10-29 (×3): qty 30, 30d supply, fill #5
  Filled ????-??-??: fill #5
  Filled ????-??-??: fill #4

## 2023-06-13 MED ORDER — AMPHETAMINE-DEXTROAMPHET ER 15 MG PO CP24
15.0000 mg | ORAL_CAPSULE | Freq: Two times a day (BID) | ORAL | 0 refills | Status: DC
  Filled 2023-06-13: qty 60, 30d supply, fill #0

## 2023-06-13 MED ORDER — QUETIAPINE FUMARATE 300 MG PO TABS
300.0000 mg | ORAL_TABLET | Freq: Every evening | ORAL | 1 refills | Status: AC
  Filled 2023-06-13 – 2023-08-18 (×7): qty 90, 90d supply, fill #0
  Filled 2023-08-18: qty 30, 30d supply, fill #0

## 2023-06-13 MED ORDER — GABAPENTIN 300 MG PO CAPS
300.0000 mg | ORAL_CAPSULE | Freq: Three times a day (TID) | ORAL | 5 refills | Status: AC
  Filled 2023-07-13: qty 90, 30d supply, fill #0
  Filled 2023-08-10: qty 90, 30d supply, fill #1
  Filled 2023-10-01: qty 90, 30d supply, fill #2
  Filled 2023-10-29: qty 90, 30d supply, fill #3
  Filled 2023-11-21 – 2023-11-22 (×2): qty 90, 30d supply, fill #4
  Filled 2024-01-14 – 2024-02-23 (×2): qty 90, 30d supply, fill #5

## 2023-06-16 ENCOUNTER — Other Ambulatory Visit (HOSPITAL_BASED_OUTPATIENT_CLINIC_OR_DEPARTMENT_OTHER): Payer: Self-pay

## 2023-06-16 ENCOUNTER — Other Ambulatory Visit: Payer: Self-pay

## 2023-06-16 MED ORDER — AMPHETAMINE-DEXTROAMPHETAMINE 15 MG PO TABS
15.0000 mg | ORAL_TABLET | Freq: Two times a day (BID) | ORAL | 0 refills | Status: DC
Start: 2023-06-16 — End: 2023-08-13
  Filled 2023-06-16: qty 60, 30d supply, fill #0

## 2023-06-16 MED ORDER — AMPHETAMINE-DEXTROAMPHETAMINE 15 MG PO TABS: 15.0000 mg | ORAL_TABLET | Freq: Two times a day (BID) | ORAL | 0 refills | Status: AC

## 2023-06-16 MED ORDER — AMPHETAMINE-DEXTROAMPHETAMINE 15 MG PO TABS
15.0000 mg | ORAL_TABLET | Freq: Two times a day (BID) | ORAL | 0 refills | Status: DC
  Filled 2023-07-14: qty 60, 30d supply, fill #0

## 2023-06-17 ENCOUNTER — Other Ambulatory Visit (HOSPITAL_COMMUNITY): Payer: Self-pay

## 2023-06-17 ENCOUNTER — Other Ambulatory Visit (HOSPITAL_BASED_OUTPATIENT_CLINIC_OR_DEPARTMENT_OTHER): Payer: Self-pay

## 2023-06-19 ENCOUNTER — Other Ambulatory Visit: Payer: Self-pay

## 2023-06-19 ENCOUNTER — Other Ambulatory Visit (HOSPITAL_BASED_OUTPATIENT_CLINIC_OR_DEPARTMENT_OTHER): Payer: Self-pay

## 2023-06-23 ENCOUNTER — Other Ambulatory Visit: Payer: Self-pay

## 2023-06-23 ENCOUNTER — Other Ambulatory Visit (HOSPITAL_BASED_OUTPATIENT_CLINIC_OR_DEPARTMENT_OTHER): Payer: Self-pay

## 2023-06-25 ENCOUNTER — Other Ambulatory Visit (HOSPITAL_BASED_OUTPATIENT_CLINIC_OR_DEPARTMENT_OTHER): Payer: Self-pay

## 2023-07-11 ENCOUNTER — Ambulatory Visit: Admitting: Gastroenterology

## 2023-07-13 ENCOUNTER — Other Ambulatory Visit (HOSPITAL_BASED_OUTPATIENT_CLINIC_OR_DEPARTMENT_OTHER): Payer: Self-pay

## 2023-07-13 ENCOUNTER — Other Ambulatory Visit: Payer: Self-pay | Admitting: Family Medicine

## 2023-07-13 DIAGNOSIS — G8929 Other chronic pain: Secondary | ICD-10-CM

## 2023-07-14 ENCOUNTER — Other Ambulatory Visit: Payer: Self-pay

## 2023-07-14 ENCOUNTER — Other Ambulatory Visit (HOSPITAL_BASED_OUTPATIENT_CLINIC_OR_DEPARTMENT_OTHER): Payer: Self-pay

## 2023-07-14 MED ORDER — METAXALONE 800 MG PO TABS
800.0000 mg | ORAL_TABLET | Freq: Three times a day (TID) | ORAL | 1 refills | Status: AC | PRN
  Filled 2023-07-14: qty 30, 10d supply, fill #0
  Filled 2023-08-10: qty 30, 10d supply, fill #1

## 2023-07-14 NOTE — Telephone Encounter (Signed)
 Just sent in April with a refill, but was written for #30 and could take up to 3 times daily if needed. Please advise on refill.

## 2023-07-17 ENCOUNTER — Ambulatory Visit: Admitting: Psychiatry

## 2023-07-21 ENCOUNTER — Other Ambulatory Visit (HOSPITAL_BASED_OUTPATIENT_CLINIC_OR_DEPARTMENT_OTHER): Payer: Self-pay

## 2023-07-28 ENCOUNTER — Ambulatory Visit: Payer: Self-pay

## 2023-07-28 NOTE — Telephone Encounter (Signed)
 FYI Only or Action Required?: FYI only for provider  Patient was last seen in primary care on 05/23/2023 by Everlina Hock, NP. Called Nurse Triage reporting Fever. Symptoms began a week ago. Interventions attempted: OTC medications: tylenol  and advil. Symptoms are: unchanged.  Triage Disposition: See Physician Within 24 Hours  Patient/caregiver understands and will follow disposition?: Yes          Copied from CRM [id number]. Topic: Clinical - Red Word Triage >> Jul 28, 2023  3:11 PM [place] E wrote: [facility] that prompted transfer to Nurse Triage: Fever and Covid symptoms. Tested positive for Covid last week Tuesday and last night. Fever of 100.2 and worsening cough, headaches, and sore throat. Reason for Disposition  Fever present > 3 days (72 hours)  Answer Assessment - Initial Assessment Questions 1. TEMPERATURE: What is the most recent temperature?  How was it measured?      100.2 2. ONSET: When did the fever start?      Since last Tuesday 3. CHILLS: Do you have chills? If yes: How bad are they?  (e.g., none, mild, moderate, severe)   - NONE: no chills   - MILD: feeling cold   - MODERATE: feeling very cold, some shivering (feels better under a thick blanket)   - SEVERE: feeling extremely cold with shaking chills (general body shaking, rigors; even under a thick blanket)      Yes, moderate 4. OTHER SYMPTOMS: Do you have any other symptoms besides the fever?  (e.g., abdomen pain, cough, diarrhea, earache, headache, sore throat, urination pain)     Cough, headache, sore throat, diarrhea 5. CAUSE: If there are no symptoms, ask: What do you think is causing the fever?      States home covid test was positive 6. CONTACTS: Does anyone else in the family have an infection?     no 7. TREATMENT: What have you done so far to treat this fever? (e.g., medications)     Tylenol , advil 8. IMMUNOCOMPROMISE: Do you have of the following: diabetes, HIV positive,  splenectomy, cancer chemotherapy, chronic steroid treatment, transplant patient, etc.     no 9. PREGNANCY: Is there any chance you are pregnant? When was your last menstrual period?     no 10. TRAVEL: Have you traveled out of the country in the last month? (e.g., travel history, exposures)       no  Protocols used: [facility]

## 2023-07-29 ENCOUNTER — Ambulatory Visit: Admitting: Family Medicine

## 2023-07-29 ENCOUNTER — Encounter: Payer: Self-pay | Admitting: Family Medicine

## 2023-07-29 VITALS — BP 106/71 | HR 96 | Temp 97.9°F | Ht 67.0 in | Wt 282.0 lb

## 2023-07-29 DIAGNOSIS — U071 COVID-19: Secondary | ICD-10-CM

## 2023-07-29 NOTE — Progress Notes (Signed)
 Acute Office Visit  Subjective:     Patient ID: Wendy Maynard, female    DOB: May 04, 1975, 48 y.o.   MRN: [id number]  Chief Complaint  Patient presents with   Fever     Patient is in today for fever, COVID.   Discussed the use of AI scribe software for clinical note transcription with the patient, who gave verbal consent to proceed.  History of Present Illness Wendy Maynard is a 48 year old female who presents with symptoms of COVID-19.  She began experiencing symptoms consistent with COVID-19 after potentially being exposed while caring for her mother, who had dental surgery. Symptoms started over a weekend and were initially attributed to a summer cold or allergies. By Tuesday afternoon, she felt extremely fatigued, falling asleep at work, and subsequently developed chills and sweating. A home COVID-19 test was positive.  Her symptoms have included severe fatigue, fever, chills, sore throat, body aches, constipation, diarrhea, and headaches. The headaches were described as severe, similar to migraines for which she has intermittent FMLA leave. Her fever initially reached 101.79F for five days, then decreased to 100.79F. She has been managing her symptoms with Tylenol , rest, and fluids, including Gatorade.  She has experienced a persistent cough and significant sinus drainage, leading to a sore throat. With a history of sinus infections, she describes the current drainage as similar. She has been using over-the-counter cough and cold medications and resting extensively due to fatigue.  She has had COVID-19 once before, with milder symptoms. 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 No current breathing difficulties or chest tightness.            All review of systems negative except what is listed in the HPI      Objective:    BP 106/71   Pulse 96   Temp 97.9 F (36.6 C) (Oral)   Ht 5' 7 (1.702 m)   Wt 282 lb (127.9 kg)   LMP  (LMP Unknown)   SpO2 98%   BMI  44.17 kg/m    Physical Exam Vitals reviewed.  Constitutional:      Appearance: Normal appearance. She is obese.  HENT:     Head: Normocephalic and atraumatic.     Ears:     Comments: Mild ear effusions bilaterally, no erythema     Mouth/Throat:     Pharynx: Postnasal drip present.   Cardiovascular:     Rate and Rhythm: Normal rate and regular rhythm.  Pulmonary:     Effort: Pulmonary effort is normal.     Breath sounds: Normal breath sounds. No wheezing, rhonchi or rales.   Skin:    General: Skin is warm and dry.   Neurological:     Mental Status: She is alert and oriented to person, place, and time.   Psychiatric:        Mood and Affect: Mood normal.        Behavior: Behavior normal.        Thought Content: Thought content normal.        Judgment: Judgment normal.         No results found for any visits on 07/29/23.      Assessment & Plan:   Problem List Items Addressed This Visit   None Visit Diagnoses       COVID-19    -  Primary       Assessment & Plan COVID-19 Acute COVID-19 infection confirmed by home test. Symptoms include fever, chills,  sore throat, myalgia, fatigue, and cough. No secondary bacterial infection noted currently. Emphasized rest to prevent complications. - Advise rest and hydration. - Monitor for signs of secondary infection such as worsening cough, dyspnea, or chest tightness. - Consider chest x-ray if symptoms worsen. - Use OTC cough and cold medications as needed. - Use Flonase or Nasacort to prevent sinus inflammation. - Advise return to work on Monday if afebrile for 24 hours without antipyretics. States she will be sending me FMLA forms for missed days.      No orders of the defined types were placed in this encounter.   Return if symptoms worsen or fail to improve.  Everlina Hock, NP

## 2023-07-30 ENCOUNTER — Telehealth: Payer: Self-pay | Admitting: Neurology

## 2023-07-30 NOTE — Telephone Encounter (Signed)
 Copied from CRM [id number]. Topic: General - Other >> Jul 30, 2023  9:36 AM Dorisann Garre T wrote: Reason for CRM: patient is calling in regarding her fmla paper work she always wanted to know if she could pay for the fmla paperwork fax number [phone] she would like a call back regarding this

## 2023-07-30 NOTE — Telephone Encounter (Signed)
 Spoke with patient. Let her know forms completed and faxed back with confirmation received.   Copy sent to scan. Copy to hold.

## 2023-07-31 ENCOUNTER — Telehealth: Payer: Self-pay | Admitting: Psychiatry

## 2023-07-31 ENCOUNTER — Ambulatory Visit (INDEPENDENT_AMBULATORY_CARE_PROVIDER_SITE_OTHER): Admitting: Psychiatry

## 2023-07-31 DIAGNOSIS — F4310 Post-traumatic stress disorder, unspecified: Secondary | ICD-10-CM | POA: Diagnosis not present

## 2023-07-31 NOTE — Telephone Encounter (Signed)
 Ms. arvetta, araque are scheduled for a virtual visit with your provider today.    Just as we do with appointments in the office, we must obtain your consent to participate.  Your consent will be active for this visit and any virtual visit you may have with one of our providers in the next 365 days.    If you have a MyChart account, I can also send a copy of this consent to you electronically.  All virtual visits are billed to your insurance company just like a traditional visit in the office.  As this is a virtual visit, video technology does not allow for your provider to perform a traditional examination.  This may limit your provider's ability to fully assess your condition.  If your provider identifies any concerns that need to be evaluated in person or the need to arrange testing such as labs, EKG, etc, we will make arrangements to do so.    Although advances in technology are sophisticated, we cannot ensure that it will always work on either your end or our end.  If the connection with a video visit is poor, we may have to switch to a telephone visit.  With either a video or telephone visit, we are not always able to ensure that we have a secure connection.   I need to obtain your verbal consent now.   Are you willing to proceed with your visit today?   Joie Cordone has provided verbal consent on 07/31/2023 for a virtual visit (video or telephone).   Marlise Simpers, [facility] 07/31/2023  5:04 PM
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
## 2023-08-05 ENCOUNTER — Other Ambulatory Visit (HOSPITAL_BASED_OUTPATIENT_CLINIC_OR_DEPARTMENT_OTHER): Payer: Self-pay

## 2023-08-05 ENCOUNTER — Encounter (HOSPITAL_BASED_OUTPATIENT_CLINIC_OR_DEPARTMENT_OTHER): Payer: Self-pay

## 2023-08-06 ENCOUNTER — Other Ambulatory Visit: Payer: Self-pay

## 2023-08-11 ENCOUNTER — Other Ambulatory Visit (HOSPITAL_BASED_OUTPATIENT_CLINIC_OR_DEPARTMENT_OTHER): Payer: Self-pay

## 2023-08-12 ENCOUNTER — Other Ambulatory Visit (HOSPITAL_BASED_OUTPATIENT_CLINIC_OR_DEPARTMENT_OTHER): Payer: Self-pay

## 2023-08-13 ENCOUNTER — Other Ambulatory Visit: Payer: Self-pay

## 2023-08-13 ENCOUNTER — Emergency Department (HOSPITAL_COMMUNITY)
Admission: EM | Admit: 2023-08-13 | Discharge: 2023-08-13 | Disposition: A | Discharge status: Home / Self Care | Attending: Emergency Medicine | Admitting: Emergency Medicine

## 2023-08-13 ENCOUNTER — Encounter (HOSPITAL_COMMUNITY): Payer: Self-pay

## 2023-08-13 ENCOUNTER — Emergency Department (HOSPITAL_COMMUNITY)

## 2023-08-13 DIAGNOSIS — E119 Type 2 diabetes mellitus without complications: Secondary | ICD-10-CM | POA: Diagnosis not present

## 2023-08-13 DIAGNOSIS — R079 Chest pain, unspecified: Secondary | ICD-10-CM | POA: Diagnosis not present

## 2023-08-13 DIAGNOSIS — R0789 Other chest pain: Secondary | ICD-10-CM | POA: Insufficient documentation

## 2023-08-13 DIAGNOSIS — I499 Cardiac arrhythmia, unspecified: Secondary | ICD-10-CM | POA: Diagnosis not present

## 2023-08-13 DIAGNOSIS — Z7984 Long term (current) use of oral hypoglycemic drugs: Secondary | ICD-10-CM | POA: Diagnosis not present

## 2023-08-13 DIAGNOSIS — I10 Essential (primary) hypertension: Secondary | ICD-10-CM | POA: Diagnosis not present

## 2023-08-13 DIAGNOSIS — R110 Nausea: Secondary | ICD-10-CM | POA: Diagnosis not present

## 2023-08-13 DIAGNOSIS — R42 Dizziness and giddiness: Secondary | ICD-10-CM | POA: Diagnosis not present

## 2023-08-13 LAB — CBC
HCT: 38.5 % (ref 36.0–46.0)
Hemoglobin: 11.9 g/dL — ABNORMAL LOW (ref 12.0–15.0)
MCH: 24.3 pg — ABNORMAL LOW (ref 26.0–34.0)
MCHC: 30.9 g/dL (ref 30.0–36.0)
MCV: 78.6 fL — ABNORMAL LOW (ref 80.0–100.0)
Platelets: 208 10*3/uL (ref 150–400)
RBC: 4.9 MIL/uL (ref 3.87–5.11)
RDW: 16.2 % — ABNORMAL HIGH (ref 11.5–15.5)
WBC: 10.1 10*3/uL (ref 4.0–10.5)
nRBC: 0 % (ref 0.0–0.2)

## 2023-08-13 LAB — BASIC METABOLIC PANEL WITH GFR
Anion gap: 10 (ref 5–15)
BUN: 15 mg/dL (ref 6–20)
CO2: 24 mmol/L (ref 22–32)
Calcium: 9.1 mg/dL (ref 8.9–10.3)
Chloride: 107 mmol/L (ref 98–111)
Creatinine, Ser: 0.73 mg/dL (ref 0.44–1.00)
GFR, Estimated: >60 mL/min (ref >60)
Glucose, Bld: 102 mg/dL — ABNORMAL HIGH (ref 70–99)
Potassium: 4.2 mmol/L (ref 3.5–5.1)
Sodium: 141 mmol/L (ref 135–145)

## 2023-08-13 LAB — TROPONIN I (HIGH SENSITIVITY)
Troponin I (High Sensitivity): 4 ng/L (ref <18)
Troponin I (High Sensitivity): 3 ng/L (ref <18)
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
## 2023-08-13 NOTE — Discharge Instructions (Signed)
 You were evaluated in the emergency room for chest pain.  Your lab work and imaging did not show any significant abnormality.  If your symptoms persist please follow-up with your primary care doctor. If you experience any new or worsening symptoms please return to the emergency room.

## 2023-08-13 NOTE — ED Triage Notes (Signed)
 Pt bib in [facility] from work sudden onset cp with nausea. No diarrhea, vomiting, no radiation. Pain has subsided. No hx of HTN.   324 of aspirin 1 nitro

## 2023-08-14 ENCOUNTER — Other Ambulatory Visit (HOSPITAL_BASED_OUTPATIENT_CLINIC_OR_DEPARTMENT_OTHER): Payer: Self-pay

## 2023-08-14 ENCOUNTER — Ambulatory Visit: Admitting: Psychiatry

## 2023-08-18 ENCOUNTER — Other Ambulatory Visit: Payer: Self-pay

## 2023-08-18 ENCOUNTER — Other Ambulatory Visit (HOSPITAL_BASED_OUTPATIENT_CLINIC_OR_DEPARTMENT_OTHER): Payer: Self-pay

## 2023-08-26 ENCOUNTER — Other Ambulatory Visit (HOSPITAL_BASED_OUTPATIENT_CLINIC_OR_DEPARTMENT_OTHER): Payer: Self-pay

## 2023-08-26 ENCOUNTER — Ambulatory Visit: Admitting: Psychiatry

## 2023-09-02 ENCOUNTER — Other Ambulatory Visit (HOSPITAL_BASED_OUTPATIENT_CLINIC_OR_DEPARTMENT_OTHER): Payer: Self-pay

## 2023-09-02 ENCOUNTER — Encounter: Payer: Self-pay | Admitting: Gastroenterology

## 2023-09-02 ENCOUNTER — Ambulatory Visit (INDEPENDENT_AMBULATORY_CARE_PROVIDER_SITE_OTHER): Admitting: Gastroenterology

## 2023-09-02 VITALS — BP 142/90 | HR 84 | Ht 66.5 in | Wt 286.2 lb

## 2023-09-02 DIAGNOSIS — D509 Iron deficiency anemia, unspecified: Secondary | ICD-10-CM | POA: Diagnosis not present

## 2023-09-02 DIAGNOSIS — Z791 Long term (current) use of non-steroidal anti-inflammatories (NSAID): Secondary | ICD-10-CM

## 2023-09-02 DIAGNOSIS — R194 Change in bowel habit: Secondary | ICD-10-CM | POA: Diagnosis not present

## 2023-09-02 DIAGNOSIS — R112 Nausea with vomiting, unspecified: Secondary | ICD-10-CM | POA: Diagnosis not present

## 2023-09-02 HISTORY — DX: Morbid (severe) obesity due to excess calories: E66.01

## 2023-09-02 MED ORDER — NA SULFATE-K SULFATE-MG SULF 17.5-3.13-1.6 GM/177ML PO SOLN
1.0000 | Freq: Once | ORAL | 0 refills | Status: AC
  Filled 2023-09-02: qty 354, 1d supply, fill #0

## 2023-09-02 NOTE — Patient Instructions (Signed)
 You have been scheduled for an endoscopy and colonoscopy. Please follow the written instructions given to you at your visit today.  If you use inhalers (even only as needed), please bring them with you on the day of your procedure.  DO NOT TAKE 7 DAYS PRIOR TO TEST- Trulicity (dulaglutide) Ozempic , Wegovy  (semaglutide ) Mounjaro (tirzepatide) Bydureon Bcise (exanatide extended release)  DO NOT TAKE 1 DAY PRIOR TO YOUR TEST Rybelsus  (semaglutide ) Adlyxin (lixisenatide) Victoza (liraglutide) Byetta (exanatide) ________________________________________________________________________  _______________________________________________________  If your blood pressure at your visit was 140/90 or greater, please contact your primary care physician to follow up on this.  _______________________________________________________  If you are age 51 or older, your body mass index should be between 23-30. Your Body mass index is 45.51 kg/m. If this is out of the aforementioned range listed, please consider follow up with your Primary Care Provider.  If you are age 54 or younger, your body mass index should be between 19-25. Your Body mass index is 45.51 kg/m. If this is out of the aformentioned range listed, please consider follow up with your Primary Care Provider.   ________________________________________________________  The [facility] providers would like to encourage you to use MYCHART to communicate with providers for non-urgent requests or questions.  Due to long hold times on the telephone, sending your provider a message by [facility] may be a faster and more efficient way to get a response.  Please allow 48 business hours for a response.  Please remember that this is for non-urgent requests.  _______________________________________________________  [facility] is using a team-based approach to care.  Your team is made up of your doctor and two to three APPS. Our APPS (Nurse  Practitioners and Physician Assistants) work with your physician to ensure care continuity for you. They are fully qualified to address your health concerns and develop a treatment plan. They communicate directly with your gastroenterologist to care for you. Seeing the Advanced Practice Practitioners on your physician's team can help you by facilitating care more promptly, often allowing for earlier appointments, access to diagnostic testing, procedures, and other specialty referrals.

## 2023-09-02 NOTE — Progress Notes (Unsigned)
 09/02/2023 Wendy Maynard [id number] 1975/09/22   HISTORY OF PRESENT ILLNESS:  Past Medical History:  Diagnosis Date   Abdominal pain in female patient 11/11/2012   Anxiety    Depression    Diabetes mellitus without complication (HCC)    Lyme disease    ~age 48   Migraines    Polycystic ovarian disease    Past Surgical History:  Procedure Laterality Date   CHOLECYSTECTOMY N/A 02/26/2013   Procedure: LAPAROSCOPIC CHOLECYSTECTOMY WITH INTRAOPERATIVE CHOLANGIOGRAM;  Surgeon: Krystal JINNY Russell, MD;  Location: [facility];  Service: General;  Laterality: N/A;   HYSTERECTOMY ABDOMINAL WITH SALPINGECTOMY     WISDOM TOOTH EXTRACTION      reports that she quit smoking about 2 years ago. Her smoking use included cigarettes. She has never used smokeless tobacco. She reports that she does not currently use alcohol. She reports that she does not use drugs. family history includes ADD / ADHD in her cousin and sister; Alzheimer's disease in her maternal grandmother; Anxiety disorder in her sister; Asperger's syndrome in her cousin; Colon cancer in her paternal aunt; Colon polyps in her father and mother; Depression in her mother and sister; Diverticulitis in her father; Hypertension in her father, maternal grandmother, and paternal grandmother; Migraines in her father and sister; Other in her father and sister. Allergies  Allergen Reactions   Ciprofloxacin  Hives and Other (See Comments)    Started in arm as soon as drug started.   Clindamycin /Lincomycin Diarrhea and Other (See Comments)    Causes excessive diarrhea   Vicodin [Hydrocodone -Acetaminophen ] Itching and Nausea And Vomiting   Penicillins Rash and Other (See Comments)    Occurred at 48 years old Has patient had a PCN reaction causing immediate rash, facial/tongue/throat swelling, SOB or lightheadedness with hypotension: yes Has patient had a PCN reaction causing severe rash involving mucus membranes or skin necrosis: no Has patient had a  PCN reaction that required hospitalization no Has patient had a PCN reaction occurring within the last 10 years:no If all of the above answers are NO, then may proceed with Cephalosporin use.    Sulfa Antibiotics Rash      Outpatient Encounter Medications as of 09/02/2023  Medication Sig   ALPRAZolam  (XANAX ) 0.5 MG tablet Take 1 tablet (0.5 mg total) by mouth 2 (two) times daily.   amphetamine -dextroamphetamine  (ADDERALL) 15 MG tablet Take 1 tablet by mouth 2 (two) times daily.   amphetamine -dextroamphetamine  (ADDERALL) 15 MG tablet Take 1 tablet by mouth 2 (two) times daily.   Ascorbic Acid (VITAMIN C) 100 MG tablet Take 100 mg by mouth daily.   busPIRone  (BUSPAR ) 15 MG tablet Take 1 tablet (15 mg total) by mouth 2 (two) times daily.   CALCIUM PO Take by mouth.   CHELATED MAGNESIUM  PO Take by mouth.   estradiol  (VIVELLE -DOT) 0.1 MG/24HR patch Place 1 patch (0.1 mg total) onto the skin 2 (two) times a week.   Fremanezumab -vfrm ([name] ) 225 MG/1.5ML SOAJ Inject 225 mg into the skin every 30 (thirty) days.   gabapentin  (NEURONTIN ) 300 MG capsule Take 1 capsule (300 mg total) by mouth 3 (three) times daily.   lidocaine  (LIDODERM ) 5 % Place 1 patch onto the skin daily. Keep patch on for 12 hours and remove for 12 hours   metaxalone  (SKELAXIN ) 800 MG tablet Take 1 tablet (800 mg total) by mouth 3 (three) times daily as needed for muscle spasms.   Multiple Vitamin (MULTIVITAMIN) tablet Take 1 tablet by mouth daily.   Omega-3  Fatty Acids (FISH OIL ADULT GUMMIES PO) Take by mouth.   ondansetron  (ZOFRAN ) 4 MG tablet Take 1 tablet (4 mg total) by mouth every 6 (six) hours.   promethazine  (PHENERGAN ) 12.5 MG tablet Take 1 tablet (12.5 mg total) by mouth every 6 (six) hours as needed for nausea or vomiting.   QUEtiapine  (SEROQUEL ) 300 MG tablet Take 1 tablet (300 mg total) by mouth at bedtime.   sertraline  (ZOLOFT ) 100 MG tablet Take 1 tablet (100 mg total) by mouth 2 (two) times daily.   SUMAtriptan   (IMITREX ) 100 MG tablet Take 1 tablet (100 mg total) by mouth once as needed for up to 1 dose. May repeat in 2 hours if headache persists or recurs.   vitamin B-12 (CYANOCOBALAMIN ) 100 MCG tablet Take 100 mcg by mouth daily.   zolpidem  (AMBIEN ) 10 MG tablet Take 1 tablet (10 mg total) by mouth at bedtime.   [DISCONTINUED] ALPRAZolam  (XANAX ) 0.5 MG tablet Take 1 tablet (0.5 mg total) by mouth at bedtime as needed for anxiety.   [DISCONTINUED] ALPRAZolam  (XANAX ) 0.5 MG tablet Take 1 tablet (0.5 mg total) by mouth 2 (two) times daily as needed for anxiety.   [DISCONTINUED] amphetamine -dextroamphetamine  (ADDERALL XR) 15 MG 24 hr capsule Take 1 capsule by mouth 2 (two) times daily.   [DISCONTINUED] busPIRone  (BUSPAR ) 15 MG tablet Take 1 tablet (15 mg total) by mouth 2 (two) times daily.   [DISCONTINUED] estradiol  (ESTRACE ) 1 MG tablet Take 1 tablet (1 mg total) by mouth daily.   [DISCONTINUED] gabapentin  (NEURONTIN ) 300 MG capsule Take 1 capsule (300 mg total) by mouth 3 (three) times daily.   [DISCONTINUED] sertraline  (ZOLOFT ) 100 MG tablet Take 2 tablets (200 mg total) by mouth daily.   [DISCONTINUED] zolpidem  (AMBIEN ) 10 MG tablet Take 1 tablet (10 mg total) by mouth at bedtime as needed.   [DISCONTINUED] zolpidem  (AMBIEN ) 10 MG tablet Take 1 tablet (10 mg total) by mouth at bedtime as needed for sleep.   Facility-Administered Encounter Medications as of 09/02/2023  Medication   Fremanezumab -vfrm SOSY 225 mg     REVIEW OF SYSTEMS  : All other systems reviewed and negative except where noted in the History of Present Illness.   PHYSICAL EXAM: BP (!) 142/90 (BP Location: Left Arm, Patient Position: Sitting, Cuff Size: Large)   Pulse 84   Ht 5' 6.5 (1.689 m) Comment: height measured without shoes  Wt 286 lb 4 oz (129.8 kg)   LMP  (LMP Unknown)   BMI 45.51 kg/m  General: Well developed white female in no acute distress Head: Normocephalic and atraumatic Eyes:  sclerae anicteric,conjunctive  pink. Ears: Normal auditory acuity Neck: Supple, no masses.  Lungs: Clear throughout to auscultation Heart: Regular rate and rhythm Abdomen: Soft, nontender, non distended. No masses or hepatomegaly noted. Normal bowel sounds Rectal: *** Musculoskeletal: Symmetrical with no gross deformities  Skin: No lesions on visible extremities Extremities: No edema  Neurological: Alert oriented x 4, grossly nonfocal Cervical Nodes:  No significant cervical adenopathy Psychological:  Alert and cooperative. Normal mood and affect  ASSESSMENT AND PLAN:    CC:  Almarie Waddell NOVAK, NP

## 2023-09-03 ENCOUNTER — Other Ambulatory Visit: Payer: Self-pay

## 2023-09-04 ENCOUNTER — Other Ambulatory Visit (HOSPITAL_BASED_OUTPATIENT_CLINIC_OR_DEPARTMENT_OTHER): Payer: Self-pay

## 2023-09-04 DIAGNOSIS — F4310 Post-traumatic stress disorder, unspecified: Secondary | ICD-10-CM | POA: Diagnosis not present

## 2023-09-04 DIAGNOSIS — F339 Major depressive disorder, recurrent, unspecified: Secondary | ICD-10-CM | POA: Diagnosis not present

## 2023-09-04 DIAGNOSIS — F410 Panic disorder [episodic paroxysmal anxiety] without agoraphobia: Secondary | ICD-10-CM | POA: Diagnosis not present

## 2023-09-04 DIAGNOSIS — F900 Attention-deficit hyperactivity disorder, predominantly inattentive type: Secondary | ICD-10-CM | POA: Diagnosis not present

## 2023-09-04 DIAGNOSIS — G4700 Insomnia, unspecified: Secondary | ICD-10-CM | POA: Diagnosis not present

## 2023-09-04 DIAGNOSIS — F411 Generalized anxiety disorder: Secondary | ICD-10-CM | POA: Diagnosis not present

## 2023-09-04 MED ORDER — QUETIAPINE FUMARATE 300 MG PO TABS
300.0000 mg | ORAL_TABLET | Freq: Every evening | ORAL | 1 refills | Status: AC
  Filled 2023-09-04: qty 90, 90d supply, fill #0
  Filled 2023-11-18: qty 90, 90d supply, fill #1

## 2023-09-04 MED ORDER — SERTRALINE HCL 100 MG PO TABS
100.0000 mg | ORAL_TABLET | Freq: Two times a day (BID) | ORAL | 1 refills | Status: AC
  Filled 2023-09-04 – 2023-10-01 (×2): qty 180, 90d supply, fill #0
  Filled 2024-01-14: qty 180, 90d supply, fill #1

## 2023-09-04 MED ORDER — AMPHETAMINE-DEXTROAMPHETAMINE 15 MG PO TABS
15.0000 mg | ORAL_TABLET | Freq: Two times a day (BID) | ORAL | 0 refills | Status: AC
  Filled 2023-10-17: qty 60, 30d supply, fill #0

## 2023-09-04 MED ORDER — AMPHETAMINE-DEXTROAMPHETAMINE 15 MG PO TABS
15.0000 mg | ORAL_TABLET | Freq: Two times a day (BID) | ORAL | 0 refills | Status: AC
  Filled 2023-11-21: qty 60, 30d supply, fill #0

## 2023-09-04 MED ORDER — BUSPIRONE HCL 15 MG PO TABS
15.0000 mg | ORAL_TABLET | Freq: Two times a day (BID) | ORAL | 1 refills | Status: AC
  Filled 2023-09-04: qty 180, 90d supply, fill #0
  Filled 2024-02-10: qty 180, 90d supply, fill #1

## 2023-09-04 MED ORDER — ALPRAZOLAM 0.5 MG PO TABS
0.5000 mg | ORAL_TABLET | Freq: Three times a day (TID) | ORAL | 5 refills | Status: DC | PRN
  Filled 2023-09-15: qty 75, 25d supply, fill #0
  Filled 2023-10-15 – 2023-11-05 (×2): qty 75, 25d supply, fill #1
  Filled 2023-12-02 (×2): qty 75, 25d supply, fill #2
  Filled 2023-12-30: qty 75, 25d supply, fill #3
  Filled 2024-01-23 – 2024-01-27 (×2): qty 75, 25d supply, fill #4
  Filled 2024-02-23: qty 75, 25d supply, fill #5
  Filled ????-??-?? (×2): fill #1

## 2023-09-04 MED ORDER — AMPHETAMINE-DEXTROAMPHETAMINE 15 MG PO TABS
15.0000 mg | ORAL_TABLET | Freq: Two times a day (BID) | ORAL | 0 refills | Status: AC
  Filled 2023-09-18: qty 60, 30d supply, fill #0

## 2023-09-05 ENCOUNTER — Other Ambulatory Visit (HOSPITAL_BASED_OUTPATIENT_CLINIC_OR_DEPARTMENT_OTHER): Payer: Self-pay

## 2023-09-05 ENCOUNTER — Other Ambulatory Visit: Payer: Self-pay

## 2023-09-08 NOTE — Progress Notes (Signed)
 ____________________________________________________________  Attending physician addendum:  Thank you for sending this case to me. I have reviewed the entire note and agree with the plan.   Amada Jupiter, MD  ____________________________________________________________

## 2023-09-09 ENCOUNTER — Ambulatory Visit: Admitting: Psychiatry

## 2023-09-09 DIAGNOSIS — F4310 Post-traumatic stress disorder, unspecified: Secondary | ICD-10-CM | POA: Diagnosis not present
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
## 2023-09-15 ENCOUNTER — Other Ambulatory Visit (HOSPITAL_BASED_OUTPATIENT_CLINIC_OR_DEPARTMENT_OTHER): Payer: Self-pay

## 2023-09-17 ENCOUNTER — Encounter: Payer: Self-pay | Admitting: Gastroenterology

## 2023-09-18 ENCOUNTER — Telehealth: Payer: Self-pay | Admitting: Gastroenterology

## 2023-09-18 ENCOUNTER — Other Ambulatory Visit: Payer: Self-pay

## 2023-09-18 ENCOUNTER — Other Ambulatory Visit (HOSPITAL_BASED_OUTPATIENT_CLINIC_OR_DEPARTMENT_OTHER): Payer: Self-pay

## 2023-09-18 NOTE — Telephone Encounter (Signed)
 Inbound call from patient requesting when she comes in for her 8/14 procedure that he current medications to not be discussed with her care partner, her Mother. Also requesting for her medication list not to printed out at all. Please advise, thank you.

## 2023-09-18 NOTE — Telephone Encounter (Signed)
 Returned the patient's phone call. Left a detailed message informing her that she can have her mother, the care partner, not to come back to the recovery room after the procedure and that information would remain confidential that way.

## 2023-09-19 ENCOUNTER — Other Ambulatory Visit (HOSPITAL_BASED_OUTPATIENT_CLINIC_OR_DEPARTMENT_OTHER): Payer: Self-pay

## 2023-09-21 ENCOUNTER — Encounter: Payer: Self-pay | Admitting: Gastroenterology

## 2023-09-22 ENCOUNTER — Other Ambulatory Visit: Payer: Self-pay

## 2023-09-22 ENCOUNTER — Other Ambulatory Visit (HOSPITAL_BASED_OUTPATIENT_CLINIC_OR_DEPARTMENT_OTHER): Payer: Self-pay

## 2023-09-22 MED ORDER — ZOLPIDEM TARTRATE 10 MG PO TABS
10.0000 mg | ORAL_TABLET | Freq: Every day | ORAL | 0 refills | Status: AC
  Filled 2023-09-22: qty 8, 8d supply, fill #0

## 2023-09-23 ENCOUNTER — Ambulatory Visit (INDEPENDENT_AMBULATORY_CARE_PROVIDER_SITE_OTHER): Payer: Self-pay | Admitting: Psychiatry

## 2023-09-23 DIAGNOSIS — F4310 Post-traumatic stress disorder, unspecified: Secondary | ICD-10-CM

## 2023-09-23 NOTE — Progress Notes (Unsigned)
 Patient ID: Wendy Maynard, female   DOB: [birth year], 48 y.o.   MRN: [id number]    Patient did not connect for session. Sent her a link. Called her and left a message. Ended connection at 5:15 PM

## 2023-09-25 ENCOUNTER — Encounter: Payer: Self-pay | Admitting: Gastroenterology

## 2023-09-25 ENCOUNTER — Ambulatory Visit: Admitting: Gastroenterology

## 2023-09-25 ENCOUNTER — Telehealth: Payer: Self-pay | Admitting: Gastroenterology

## 2023-09-25 VITALS — BP 119/96 | HR 79 | Temp 97.3°F | Resp 16 | Ht 66.5 in | Wt 286.0 lb

## 2023-09-25 DIAGNOSIS — R194 Change in bowel habit: Secondary | ICD-10-CM

## 2023-09-25 DIAGNOSIS — K2950 Unspecified chronic gastritis without bleeding: Secondary | ICD-10-CM | POA: Diagnosis not present

## 2023-09-25 DIAGNOSIS — R112 Nausea with vomiting, unspecified: Secondary | ICD-10-CM | POA: Diagnosis not present

## 2023-09-25 DIAGNOSIS — D123 Benign neoplasm of transverse colon: Secondary | ICD-10-CM | POA: Diagnosis not present

## 2023-09-25 DIAGNOSIS — K31A11 Gastric intestinal metaplasia without dysplasia, involving the antrum: Secondary | ICD-10-CM

## 2023-09-25 DIAGNOSIS — K529 Noninfective gastroenteritis and colitis, unspecified: Secondary | ICD-10-CM | POA: Diagnosis not present

## 2023-09-25 DIAGNOSIS — K5730 Diverticulosis of large intestine without perforation or abscess without bleeding: Secondary | ICD-10-CM | POA: Diagnosis not present

## 2023-09-25 MED ORDER — SODIUM CHLORIDE 0.9 % IV SOLN: 500.0000 mL | Freq: Once | INTRAVENOUS | Status: AC

## 2023-09-25 NOTE — Progress Notes (Signed)
 Called to room to assist during endoscopic procedure.  Patient ID and intended procedure confirmed with present staff. Received instructions for my participation in the procedure from the performing physician.

## 2023-09-25 NOTE — Progress Notes (Signed)
 Pt's states no medical or surgical changes since previsit or office visit.

## 2023-09-25 NOTE — Op Note (Signed)
 [facility] Patient Name: Wendy Maynard Procedure Date: 09/25/2023 1:14 PM MRN: [id number] Endoscopist: Victory L. Legrand , MD, [id number] Age: 48 Referring MD:  Date of Birth: 1975/04/13 Gender: Female Account #: [id number] Procedure:                Upper GI endoscopy Indications:              Nausea, chronic diarrhea Medicines:                Monitored Anesthesia Care Procedure:                Pre-Anesthesia Assessment:                           - Prior to the procedure, a History and Physical                            was performed, and patient medications and                            allergies were reviewed. The patient's tolerance of                            previous anesthesia was also reviewed. The risks                            and benefits of the procedure and the sedation                            options and risks were discussed with the patient.                            All questions were answered, and informed consent                            was obtained. Prior Anticoagulants: The patient has                            taken no anticoagulant or antiplatelet agents. ASA                            Grade Assessment: III - A patient with severe                            systemic disease. After reviewing the risks and                            benefits, the patient was deemed in satisfactory                            condition to undergo the procedure.                           After obtaining informed consent, the endoscope was  passed under direct vision. Throughout the                            procedure, the patient's blood pressure, pulse, and                            oxygen saturations were monitored continuously. The                            Olympus scope [id number] was introduced through the                            mouth, and advanced to the second part of duodenum.                            The upper GI endoscopy was  accomplished without                            difficulty. The patient tolerated the procedure                            (required upper airway support). Scope In: Scope Out: Findings:                 The esophagus was normal.                           Normal mucosa was found in the entire examined                            stomach other than diffuse bile staining. Several                            biopsies were obtained in the gastric body and in                            the gastric antrum with cold forceps for histology.                            (Antrum and body in the same pathology jar to rule                            out H. pylori)                           The cardia and gastric fundus were normal on                            retroflexion. Distended well with insufflation                           Normal mucosa was found in the entire duodenum.  Biopsies for histology were taken with a cold                            forceps for evaluation of celiac disease. Complications:            No immediate complications. Estimated Blood Loss:     Estimated blood loss was minimal. Impression:               - Normal esophagus.                           - Normal mucosa was found in the entire stomach.                           - Normal mucosa was found in the entire examined                            duodenum. Biopsied.                           - Several biopsies were obtained in the gastric                            body and in the gastric antrum. Recommendation:           - Patient has a contact number available for                            emergencies. The signs and symptoms of potential                            delayed complications were discussed with the                            patient. Return to normal activities tomorrow.                            Written discharge instructions were provided to the                            patient.                            - Resume previous diet.                           - Continue present medications.                           - Await pathology results.                           - See the other procedure note for documentation of                            additional recommendations. [name] L. Legrand, MD 09/25/2023 2:31:39 PM This report has been signed  electronically.

## 2023-09-25 NOTE — Telephone Encounter (Signed)
 Patient is running sightly behind, appointment for 12:30,.

## 2023-09-25 NOTE — Progress Notes (Signed)
 Report given to PACU, vss

## 2023-09-25 NOTE — Progress Notes (Signed)
 1347 BP 194/113, Labetalol given IV, MD update, vss

## 2023-09-25 NOTE — Progress Notes (Signed)
 1351 BP 156/114, Labetalol given IV, MD update, vss
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
## 2023-09-25 NOTE — Progress Notes (Signed)
 No significant changes to clinical history since GI office visit on 09/02/23.  The patient is appropriate for an endoscopic procedure in the ambulatory setting.  - Victory Brand, MD

## 2023-09-25 NOTE — Progress Notes (Signed)
 1339 late entry   Robinul  0.1 mg IV given due large amount of secretions upon assessment.  MD made aware, vss

## 2023-09-25 NOTE — Patient Instructions (Signed)
 YOU HAD AN ENDOSCOPIC PROCEDURE TODAY AT THE [facility]:   Refer to the procedure report that was given to you for any specific questions about what was found during the examination.  If the procedure report does not answer your questions, please call your gastroenterologist to clarify.  If you requested that your care partner not be given the details of your procedure findings, then the procedure report has been included in a sealed envelope for you to review at your convenience later.  YOU SHOULD EXPECT: Some feelings of bloating in the abdomen. Passage of more gas than usual.  Walking can help get rid of the air that was put into your GI tract during the procedure and reduce the bloating. If you had a lower endoscopy (such as a colonoscopy or flexible sigmoidoscopy) you may notice spotting of blood in your stool or on the toilet paper. If you underwent a bowel prep for your procedure, you may not have a normal bowel movement for a few days.  Please Note:  You might notice some irritation and congestion in your nose or some drainage.  This is from the oxygen used during your procedure.  There is no need for concern and it should clear up in a day or so.  SYMPTOMS TO REPORT IMMEDIATELY:  Following lower endoscopy (colonoscopy or flexible sigmoidoscopy):  Excessive amounts of blood in the stool  Significant tenderness or worsening of abdominal pains  Swelling of the abdomen that is new, acute  Fever of 100F or higher  Following upper endoscopy (EGD)  Vomiting of blood or coffee ground material  New chest pain or pain under the shoulder blades  Painful or persistently difficult swallowing  New shortness of breath  Fever of 100F or higher  Black, tarry-looking stools  Colonoscopy Resume previous diet Continue present medications Await pathology results Arrange next available clinic follow up  Upper endoscopy Resume previous diet Continue present medications Await  pathology results  Handouts on diverticulosis and polyps given    For urgent or emergent issues, a gastroenterologist can be reached at any hour by calling [phone]. Do not use MyChart messaging for urgent concerns.    DIET:  We do recommend a small meal at first, but then you may proceed to your regular diet.  Drink plenty of fluids but you should avoid alcoholic beverages for 24 hours.  ACTIVITY:  You should plan to take it easy for the rest of today and you should NOT DRIVE or use heavy machinery until tomorrow (because of the sedation medicines used during the test).    FOLLOW UP: Our staff will call the number listed on your records the next business day following your procedure.  We will call around 7:15- 8:00 am to check on you and address any questions or concerns that you may have regarding the information given to you following your procedure. If we do not reach you, we will leave a message.     If any biopsies were taken you will be contacted by phone or by letter within the next 1-3 weeks.  Please call us  at [phone] if you have not heard about the biopsies in 3 weeks.    SIGNATURES/CONFIDENTIALITY: You and/or your care partner have signed paperwork which will be entered into your electronic medical record.  These signatures attest to the fact that that the information above on your After Visit Summary has been reviewed and is understood.  Full responsibility of the confidentiality of this discharge  information lies with you and/or your care-partner.

## 2023-09-25 NOTE — Op Note (Signed)
 [facility] Patient Name: Wendy Maynard Procedure Date: 09/25/2023 1:14 PM MRN: [id number] Endoscopist: Victory L. Legrand , MD, [id number] Age: 49 Referring MD:  Date of Birth: Jul 02, 1975 Gender: Female Account #: [id number] Procedure:                Colonoscopy Indications:              Chronic diarrhea Medicines:                Monitored Anesthesia Care Procedure:                Pre-Anesthesia Assessment:                           - Prior to the procedure, a History and Physical                            was performed, and patient medications and                            allergies were reviewed. The patient's tolerance of                            previous anesthesia was also reviewed. The risks                            and benefits of the procedure and the sedation                            options and risks were discussed with the patient.                            All questions were answered, and informed consent                            was obtained. Prior Anticoagulants: The patient has                            taken no anticoagulant or antiplatelet agents. ASA                            Grade Assessment: III - A patient with severe                            systemic disease. After reviewing the risks and                            benefits, the patient was deemed in satisfactory                            condition to undergo the procedure.                           After obtaining informed consent, the colonoscope  was passed under direct vision. Throughout the                            procedure, the patient's blood pressure, pulse, and                            oxygen saturations were monitored continuously. The                            Olympus CF-HQ190L ([id number]) Colonoscope was                            introduced through the anus and advanced to the the                            terminal ileum, with identification of the                             appendiceal orifice and IC valve. The colonoscopy                            was performed without difficulty. The patient                            tolerated the procedure (required bilateral                            naso-pharyngeal airways and upper airway support).                            The quality of the bowel preparation was good. The                            terminal ileum, ileocecal valve, appendiceal                            orifice, and rectum were photographed. Scope In: 1:44:40 PM Scope Out: 2:05:01 PM Scope Withdrawal Time: 0 hours 15 minutes 43 seconds  Total Procedure Duration: 0 hours 20 minutes 21 seconds  Findings:                 The perianal and digital rectal examinations were                            normal.                           The terminal ileum appeared normal.                           Diverticula were found in the entire colon.                           Normal mucosa was found in the entire colon.  Biopsies for histology were taken with a cold                            forceps from the right colon and left colon for                            evaluation of microscopic colitis.                           A 10 mm polyp was found in the distal transverse                            colon. The polyp was pedunculated. The polyp was                            removed with a hot snare. Resection and retrieval                            were complete.                           The exam was otherwise without abnormality on                            direct and retroflexion views. Complications:            No immediate complications. Estimated Blood Loss:     Estimated blood loss was minimal. Impression:               - The examined portion of the ileum was normal.                           - Diverticulosis in the entire examined colon.                           - Normal mucosa in the entire examined  colon.                            Biopsied.                           - One 10 mm polyp in the distal transverse colon,                            removed with a hot snare. Resected and retrieved.                           - The examination was otherwise normal on direct                            and retroflexion views. Recommendation:           - Patient has a contact number available for  emergencies. The signs and symptoms of potential                            delayed complications were discussed with the                            patient. Return to normal activities tomorrow.                            Written discharge instructions were provided to the                            patient.                           - Resume previous diet.                           - Continue present medications.                           - Await pathology results.                           - Repeat colonoscopy is recommended for                            surveillance. The colonoscopy date will be                            determined after pathology results from today's                            exam become available for review.                           - See the other procedure note for documentation of                            additional recommendations.                           - Arrange next available clinic follow-up with APP                            (Zehr). Carrin Vannostrand L. Legrand, MD 09/25/2023 2:27:03 PM This report has been signed electronically.

## 2023-09-26 ENCOUNTER — Telehealth: Payer: Self-pay | Admitting: Lactation Services

## 2023-09-26 NOTE — Telephone Encounter (Signed)
 No answer left voice mail

## 2023-09-30 LAB — SURGICAL PATHOLOGY

## 2023-10-01 ENCOUNTER — Other Ambulatory Visit: Payer: Self-pay

## 2023-10-01 ENCOUNTER — Other Ambulatory Visit (HOSPITAL_BASED_OUTPATIENT_CLINIC_OR_DEPARTMENT_OTHER): Payer: Self-pay

## 2023-10-01 ENCOUNTER — Ambulatory Visit: Payer: Self-pay | Admitting: Gastroenterology

## 2023-10-06 ENCOUNTER — Telehealth: Admitting: Physician Assistant

## 2023-10-06 DIAGNOSIS — J069 Acute upper respiratory infection, unspecified: Secondary | ICD-10-CM

## 2023-10-06 DIAGNOSIS — B9689 Other specified bacterial agents as the cause of diseases classified elsewhere: Secondary | ICD-10-CM

## 2023-10-06 MED ORDER — DOXYCYCLINE HYCLATE 100 MG PO TABS: 100.0000 mg | ORAL_TABLET | Freq: Two times a day (BID) | ORAL | 0 refills | Status: AC

## 2023-10-06 NOTE — Progress Notes (Signed)
 Virtual Visit Consent   Wendy Maynard, you are scheduled for a virtual visit with a [facility] provider today. Just as with appointments in the office, your consent must be obtained to participate. Your consent will be active for this visit and any virtual visit you may have with one of our providers in the next 365 days. If you have a MyChart account, a copy of this consent can be sent to you electronically.  As this is a virtual visit, video technology does not allow for your provider to perform a traditional examination. This may limit your provider's ability to fully assess your condition. If your provider identifies any concerns that need to be evaluated in person or the need to arrange testing (such as labs, EKG, etc.), we will make arrangements to do so. Although advances in technology are sophisticated, we cannot ensure that it will always work on either your end or our end. If the connection with a video visit is poor, the visit may have to be switched to a telephone visit. With either a video or telephone visit, we are not always able to ensure that we have a secure connection.  By engaging in this virtual visit, you consent to the provision of healthcare and authorize for your insurance to be billed (if applicable) for the services provided during this visit. Depending on your insurance coverage, you may receive a charge related to this service.  I need to obtain your verbal consent now. Are you willing to proceed with your visit today? [name] has provided verbal consent on 10/06/2023 for a virtual visit (video or telephone). [name], PA-C  Date: 10/06/2023 4:09 PM   Virtual Visit via Video Note   IDelon CHRISTELLA Dickinson, connected with  Wendy Maynard  ([id number], Mar 16, 1975) on 10/06/23 at  4:00 PM EDT by a video-enabled telemedicine application and verified that I am speaking with the correct person using two identifiers.  Location: Patient: Virtual Visit Location Patient:  Other: work; isolated Provider: Engineer, mining Provider: Home Office   I discussed the limitations of evaluation and management by telemedicine and the availability of in person appointments. The patient expressed understanding and agreed to proceed.    History of Present Illness: Wendy Maynard is a 48 y.o. who identifies as a female who was assigned female at birth, and is being seen today for URI symptoms.  HPI: URI  This is a new problem. The current episode started in the past 7 days. The problem has been gradually worsening. There has been no fever. Associated symptoms include chest pain (pain under left breast with coughing), congestion, coughing (productive of thick green mucus), headaches, a plugged ear sensation, rhinorrhea and sinus pain. Pertinent negatives include no diarrhea, ear pain, nausea, sore throat or vomiting. She has tried acetaminophen  and increased fluids (mucinex, sudafed, afrin) for the symptoms. The treatment provided no relief.     Problems:  Patient Active Problem List   Diagnosis Date Noted   Change in bowel habits 09/02/2023   NSAID long-term use 09/02/2023   Nausea and vomiting 09/02/2023   Iron deficiency anemia 09/02/2023   Thalassemia minor 05/23/2023   Hyperglycemia 05/23/2023   B12 deficiency 05/23/2023   Migraine with aura and without status migrainosus, not intractable 03/29/2021   Chronic pain of right knee 03/29/2021   Morbid obesity with body mass index (BMI) of 40.0 to 44.9 in adult (HCC) 03/29/2021   OSA on CPAP 03/29/2021   Postmenopausal hormone replacement therapy 03/29/2021  Insomnia 12/07/2020   ADHD, predominantly inattentive type 02/06/2018   Posttraumatic stress disorder 02/06/2018   MDD (major depressive disorder), recurrent severe, without psychosis (HCC) 04/28/2016    Allergies:  Allergies  Allergen Reactions   Ciprofloxacin  Hives and Other (See Comments)    Started in arm as soon as drug started.    Clindamycin /Lincomycin Diarrhea and Other (See Comments)    Causes excessive diarrhea   Penicillins Rash and Other (See Comments)    Occurred at 48 years old Has patient had a PCN reaction causing immediate rash, facial/tongue/throat swelling, SOB or lightheadedness with hypotension: yes Has patient had a PCN reaction causing severe rash involving mucus membranes or skin necrosis: no Has patient had a PCN reaction that required hospitalization no Has patient had a PCN reaction occurring within the last 10 years:no If all of the above answers are NO, then may proceed with Cephalosporin use.    Sulfa Antibiotics Rash   Vicodin [Hydrocodone -Acetaminophen ] Itching and Nausea And Vomiting   Medications:  Current Outpatient Medications:    doxycycline  (VIBRA -TABS) 100 MG tablet, Take 1 tablet (100 mg total) by mouth 2 (two) times daily., Disp: 20 tablet, Rfl: 0   ALPRAZolam  (XANAX ) 0.5 MG tablet, Take 1 tablet (0.5 mg total) by mouth 2 (two) times daily., Disp: 60 tablet, Rfl: 5   ALPRAZolam  (XANAX ) 0.5 MG tablet, Take 1 tablet (0.5 mg total) by mouth 3 (three) times daily as needed., Disp: 75 tablet, Rfl: 5   amphetamine -dextroamphetamine  (ADDERALL) 15 MG tablet, Take 1 tablet by mouth 2 (two) times daily., Disp: 60 tablet, Rfl: 0   amphetamine -dextroamphetamine  (ADDERALL) 15 MG tablet, Take 1 tablet by mouth 2 (two) times daily., Disp: 60 tablet, Rfl: 0   amphetamine -dextroamphetamine  (ADDERALL) 15 MG tablet, Take 1 tablet by mouth 2 (two) times daily., Disp: 60 tablet, Rfl: 0   [START ON 11/03/2023] amphetamine -dextroamphetamine  (ADDERALL) 15 MG tablet, Take 1 tablet by mouth 2 (two) times daily., Disp: 60 tablet, Rfl: 0   amphetamine -dextroamphetamine  (ADDERALL) 15 MG tablet, Take 1 tablet by mouth 2 (two) times daily., Disp: 60 tablet, Rfl: 0   Ascorbic Acid (VITAMIN C) 100 MG tablet, Take 100 mg by mouth daily., Disp: , Rfl:    busPIRone  (BUSPAR ) 15 MG tablet, Take 1 tablet (15 mg total) by  mouth 2 (two) times daily., Disp: 180 tablet, Rfl: 1   busPIRone  (BUSPAR ) 15 MG tablet, Take 1 tablet (15 mg total) by mouth in the morning and at bedtime., Disp: 180 tablet, Rfl: 1   CALCIUM PO, Take by mouth., Disp: , Rfl:    CHELATED MAGNESIUM  PO, Take by mouth., Disp: , Rfl:    estradiol  (VIVELLE -DOT) 0.1 MG/24HR patch, Place 1 patch (0.1 mg total) onto the skin 2 (two) times a week., Disp: 24 patch, Rfl: 3   Fremanezumab -vfrm ([name] ) 225 MG/1.5ML SOAJ, Inject 225 mg into the skin every 30 (thirty) days., Disp: 1.5 mL, Rfl: 11   gabapentin  (NEURONTIN ) 300 MG capsule, Take 1 capsule (300 mg total) by mouth 3 (three) times daily., Disp: 90 capsule, Rfl: 5   lidocaine  (LIDODERM ) 5 %, Place 1 patch onto the skin daily. Keep patch on for 12 hours and remove for 12 hours, Disp: 10 patch, Rfl: 0   metaxalone  (SKELAXIN ) 800 MG tablet, Take 1 tablet (800 mg total) by mouth 3 (three) times daily as needed for muscle spasms., Disp: 30 tablet, Rfl: 1   Multiple Vitamin (MULTIVITAMIN) tablet, Take 1 tablet by mouth daily., Disp: , Rfl:  Omega-3 Fatty Acids (FISH OIL ADULT GUMMIES PO), Take by mouth., Disp: , Rfl:    ondansetron  (ZOFRAN ) 4 MG tablet, Take 1 tablet (4 mg total) by mouth every 6 (six) hours., Disp: 12 tablet, Rfl: 0   promethazine  (PHENERGAN ) 12.5 MG tablet, Take 1 tablet (12.5 mg total) by mouth every 6 (six) hours as needed for nausea or vomiting., Disp: 30 tablet, Rfl: 0   QUEtiapine  (SEROQUEL ) 300 MG tablet, Take 1 tablet (300 mg total) by mouth at bedtime., Disp: 90 tablet, Rfl: 1   QUEtiapine  (SEROQUEL ) 300 MG tablet, Take 1 tablet (300 mg total) by mouth at bedtime., Disp: 90 tablet, Rfl: 1   sertraline  (ZOLOFT ) 100 MG tablet, Take 1 tablet (100 mg total) by mouth 2 (two) times daily., Disp: 180 tablet, Rfl: 1   sertraline  (ZOLOFT ) 100 MG tablet, Take 1 tablet (100 mg total) by mouth 2 (two) times daily., Disp: 180 tablet, Rfl: 1   SUMAtriptan  (IMITREX ) 100 MG tablet, Take 1 tablet  (100 mg total) by mouth once as needed for up to 1 dose. May repeat in 2 hours if headache persists or recurs., Disp: 10 tablet, Rfl: 12   vitamin B-12 (CYANOCOBALAMIN ) 100 MCG tablet, Take 100 mcg by mouth daily., Disp: , Rfl:    zolpidem  (AMBIEN ) 10 MG tablet, Take 1 tablet (10 mg total) by mouth at bedtime., Disp: 30 tablet, Rfl: 5   zolpidem  (AMBIEN ) 10 MG tablet, Take 1 tablet (10 mg total) by mouth at bedtime., Disp: 8 tablet, Rfl: 0  Current Facility-Administered Medications:    0.9 %  sodium chloride  infusion, 500 mL, Intravenous, Once, Danis, Victory CROME III, MD   Fremanezumab -vfrm SOSY 225 mg, 225 mg, Subcutaneous, Once, Ines Onetha NOVAK, MD  Observations/Objective: Patient is well-developed, well-nourished in no acute distress.  Resting comfortably at home.  Head is normocephalic, atraumatic.  No labored breathing. Speech is clear and coherent with logical content.  Patient is alert and oriented at baseline.    Assessment and Plan: 1. Bacterial upper respiratory infection (Primary) - doxycycline  (VIBRA -TABS) 100 MG tablet; Take 1 tablet (100 mg total) by mouth 2 (two) times daily.  Dispense: 20 tablet; Refill: 0  - Worsening over a week despite OTC medications - Will treat with Doxycycline  - Can continue symptomatic medications OTC as needed - Push fluids.  - Rest.  - Steam and humidifier can help - Seek in person evaluation if worsening or symptoms fail to improve    Follow Up Instructions: I discussed the assessment and treatment plan with the patient. The patient was provided an opportunity to ask questions and all were answered. The patient agreed with the plan and demonstrated an understanding of the instructions.  A copy of instructions were sent to the patient via MyChart unless otherwise noted below.    The patient was advised to call back or seek an in-person evaluation if the symptoms worsen or if the condition fails to improve as anticipated.    [name], PA-C

## 2023-10-06 NOTE — Patient Instructions (Signed)
 Wendy Maynard, thank you for joining Wendy CHRISTELLA Dickinson, PA-C for today's virtual visit.  While this provider is not your primary care provider (PCP), if your PCP is located in our provider database this encounter information will be shared with them immediately following your visit.   A [facility] MyChart account gives you access to today's visit and all your visits, tests, and labs performed at [facility]  click here if you don't have a [facility] MyChart account or go to [email or web address]  Consent: (Patient) Wendy Maynard provided verbal consent for this virtual visit at the beginning of the encounter.  Current Medications:  Current Outpatient Medications:    doxycycline  (VIBRA -TABS) 100 MG tablet, Take 1 tablet (100 mg total) by mouth 2 (two) times daily., Disp: 20 tablet, Rfl: 0   ALPRAZolam  (XANAX ) 0.5 MG tablet, Take 1 tablet (0.5 mg total) by mouth 2 (two) times daily., Disp: 60 tablet, Rfl: 5   ALPRAZolam  (XANAX ) 0.5 MG tablet, Take 1 tablet (0.5 mg total) by mouth 3 (three) times daily as needed., Disp: 75 tablet, Rfl: 5   amphetamine -dextroamphetamine  (ADDERALL) 15 MG tablet, Take 1 tablet by mouth 2 (two) times daily., Disp: 60 tablet, Rfl: 0   amphetamine -dextroamphetamine  (ADDERALL) 15 MG tablet, Take 1 tablet by mouth 2 (two) times daily., Disp: 60 tablet, Rfl: 0   amphetamine -dextroamphetamine  (ADDERALL) 15 MG tablet, Take 1 tablet by mouth 2 (two) times daily., Disp: 60 tablet, Rfl: 0   [START ON 11/03/2023] amphetamine -dextroamphetamine  (ADDERALL) 15 MG tablet, Take 1 tablet by mouth 2 (two) times daily., Disp: 60 tablet, Rfl: 0   amphetamine -dextroamphetamine  (ADDERALL) 15 MG tablet, Take 1 tablet by mouth 2 (two) times daily., Disp: 60 tablet, Rfl: 0   Ascorbic Acid (VITAMIN C) 100 MG tablet, Take 100 mg by mouth daily., Disp: , Rfl:    busPIRone  (BUSPAR ) 15 MG tablet, Take 1 tablet (15 mg total) by mouth 2 (two) times daily., Disp: 180 tablet, Rfl: 1    busPIRone  (BUSPAR ) 15 MG tablet, Take 1 tablet (15 mg total) by mouth in the morning and at bedtime., Disp: 180 tablet, Rfl: 1   CALCIUM PO, Take by mouth., Disp: , Rfl:    CHELATED MAGNESIUM  PO, Take by mouth., Disp: , Rfl:    estradiol  (VIVELLE -DOT) 0.1 MG/24HR patch, Place 1 patch (0.1 mg total) onto the skin 2 (two) times a week., Disp: 24 patch, Rfl: 3   Fremanezumab -vfrm ([name] ) 225 MG/1.5ML SOAJ, Inject 225 mg into the skin every 30 (thirty) days., Disp: 1.5 mL, Rfl: 11   gabapentin  (NEURONTIN ) 300 MG capsule, Take 1 capsule (300 mg total) by mouth 3 (three) times daily., Disp: 90 capsule, Rfl: 5   lidocaine  (LIDODERM ) 5 %, Place 1 patch onto the skin daily. Keep patch on for 12 hours and remove for 12 hours, Disp: 10 patch, Rfl: 0   metaxalone  (SKELAXIN ) 800 MG tablet, Take 1 tablet (800 mg total) by mouth 3 (three) times daily as needed for muscle spasms., Disp: 30 tablet, Rfl: 1   Multiple Vitamin (MULTIVITAMIN) tablet, Take 1 tablet by mouth daily., Disp: , Rfl:    Omega-3 Fatty Acids (FISH OIL ADULT GUMMIES PO), Take by mouth., Disp: , Rfl:    ondansetron  (ZOFRAN ) 4 MG tablet, Take 1 tablet (4 mg total) by mouth every 6 (six) hours., Disp: 12 tablet, Rfl: 0   promethazine  (PHENERGAN ) 12.5 MG tablet, Take 1 tablet (12.5 mg total) by mouth every 6 (six) hours as needed for nausea or vomiting., Disp:  30 tablet, Rfl: 0   QUEtiapine  (SEROQUEL ) 300 MG tablet, Take 1 tablet (300 mg total) by mouth at bedtime., Disp: 90 tablet, Rfl: 1   QUEtiapine  (SEROQUEL ) 300 MG tablet, Take 1 tablet (300 mg total) by mouth at bedtime., Disp: 90 tablet, Rfl: 1   sertraline  (ZOLOFT ) 100 MG tablet, Take 1 tablet (100 mg total) by mouth 2 (two) times daily., Disp: 180 tablet, Rfl: 1   sertraline  (ZOLOFT ) 100 MG tablet, Take 1 tablet (100 mg total) by mouth 2 (two) times daily., Disp: 180 tablet, Rfl: 1   SUMAtriptan  (IMITREX ) 100 MG tablet, Take 1 tablet (100 mg total) by mouth once as needed for up to 1 dose.  May repeat in 2 hours if headache persists or recurs., Disp: 10 tablet, Rfl: 12   vitamin B-12 (CYANOCOBALAMIN ) 100 MCG tablet, Take 100 mcg by mouth daily., Disp: , Rfl:    zolpidem  (AMBIEN ) 10 MG tablet, Take 1 tablet (10 mg total) by mouth at bedtime., Disp: 30 tablet, Rfl: 5   zolpidem  (AMBIEN ) 10 MG tablet, Take 1 tablet (10 mg total) by mouth at bedtime., Disp: 8 tablet, Rfl: 0  Current Facility-Administered Medications:    0.9 %  sodium chloride  infusion, 500 mL, Intravenous, Once, Danis, Victory CROME III, MD   Fremanezumab -vfrm SOSY 225 mg, 225 mg, Subcutaneous, Once, Ines Onetha NOVAK, MD   Medications ordered in this encounter:  Meds ordered this encounter  Medications   doxycycline  (VIBRA -TABS) 100 MG tablet    Sig: Take 1 tablet (100 mg total) by mouth 2 (two) times daily.    Dispense:  20 tablet    Refill:  0    Supervising Provider:   LAMPTEY, PHILIP O [[id number]]     *If you need refills on other medications prior to your next appointment, please contact your pharmacy*  Follow-Up: Call back or seek an in-person evaluation if the symptoms worsen or if the condition fails to improve as anticipated.  [facility] [phone]  Other Instructions Upper Respiratory Infection, Adult An upper respiratory infection (URI) is a common viral infection of the nose, throat, and upper air passages that lead to the lungs. The most common type of URI is the common cold. URIs usually get better on their own, without medical treatment. What are the causes? A URI is caused by a virus. You may catch a virus by: Breathing in droplets from an infected person's cough or sneeze. Touching something that has been exposed to the virus (is contaminated) and then touching your mouth, nose, or eyes. What increases the risk? You are more likely to get a URI if: You are very young or very old. You have close contact with others, such as at work, school, or a health care facility. You  smoke. You have long-term (chronic) heart or lung disease. You have a weakened disease-fighting system (immune system). You have nasal allergies or asthma. You are experiencing a lot of stress. You have poor nutrition. What are the signs or symptoms? A URI usually involves some of the following symptoms: Runny or stuffy (congested) nose. Cough. Sneezing. Sore throat. Headache. Fatigue. Fever. Loss of appetite. Pain in your forehead, behind your eyes, and over your cheekbones (sinus pain). Muscle aches. Redness or irritation of the eyes. Pressure in the ears or face. How is this diagnosed? This condition may be diagnosed based on your medical history and symptoms, and a physical exam. Your health care provider may use a swab to take a mucus  sample from your nose (nasal swab). This sample can be tested to determine what virus is causing the illness. How is this treated? URIs usually get better on their own within 7-10 days. Medicines cannot cure URIs, but your health care provider may recommend certain medicines to help relieve symptoms, such as: Over-the-counter cold medicines. Cough suppressants. Coughing is a type of defense against infection that helps to clear the respiratory system, so take these medicines only as recommended by your health care provider. Fever-reducing medicines. Follow these instructions at home: Activity Rest as needed. If you have a fever, stay home from work or school until your fever is gone or until your health care provider says your URI cannot spread to other people (is no longer contagious). Your health care provider may have you wear a face mask to prevent your infection from spreading. Relieving symptoms Gargle with a mixture of salt and water 3-4 times a day or as needed. To make salt water, completely dissolve -1 tsp (3-6 g) of salt in 1 cup (237 mL) of warm water. Use a cool-mist humidifier to add moisture to the air. This can help you breathe  more easily. Eating and drinking  Drink enough fluid to keep your urine pale yellow. Eat soups and other clear broths. General instructions  Take over-the-counter and prescription medicines only as told by your health care provider. These include cold medicines, fever reducers, and cough suppressants. Do not use any products that contain nicotine  or tobacco. These products include cigarettes, chewing tobacco, and vaping devices, such as e-cigarettes. If you need help quitting, ask your health care provider. Stay away from secondhand smoke. Stay up to date on all immunizations, including the yearly (annual) flu vaccine. Keep all follow-up visits. This is important. How to prevent the spread of infection to others URIs can be contagious. To prevent the infection from spreading: Wash your hands with soap and water for at least 20 seconds. If soap and water are not available, use hand sanitizer. Avoid touching your mouth, face, eyes, or nose. Cough or sneeze into a tissue or your sleeve or elbow instead of into your hand or into the air.  Contact a health care provider if: You are getting worse instead of better. You have a fever or chills. Your mucus is brown or red. You have yellow or brown discharge coming from your nose. You have pain in your face, especially when you bend forward. You have swollen neck glands. You have pain while swallowing. You have white areas in the back of your throat. Get help right away if: You have shortness of breath that gets worse. You have severe or persistent: Headache. Ear pain. Sinus pain. Chest pain. You have chronic lung disease along with any of the following: Making high-pitched whistling sounds when you breathe, most often when you breathe out (wheezing). Prolonged cough (more than 14 days). Coughing up blood. A change in your usual mucus. You have a stiff neck. You have changes in your: Vision. Hearing. Thinking. Mood. These symptoms  may be an emergency. Get help right away. Call 911. Do not wait to see if the symptoms will go away. Do not drive yourself to the hospital. Summary An upper respiratory infection (URI) is a common infection of the nose, throat, and upper air passages that lead to the lungs. A URI is caused by a virus. URIs usually get better on their own within 7-10 days. Medicines cannot cure URIs, but your health care provider may recommend certain  medicines to help relieve symptoms. This information is not intended to replace advice given to you by your health care provider. Make sure you discuss any questions you have with your health care provider. Document Revised: 08/30/2020 Document Reviewed: 08/30/2020 Elsevier Patient Education  2024 [employer]   If you have been instructed to have an in-person evaluation today at a local Urgent Care facility, please use the link below. It will take you to a list of all of our available [facility], including address, phone number and hours of operation. Please do not delay care.  [facility]  If you or a family member do not have a primary care provider, use the link below to schedule a visit and establish care. When you choose a [facility] primary care physician or advanced practice provider, you gain a long-term partner in health. Find a Primary Care Provider  Learn more about [facility]'s in-office and virtual care options: [facility] Get Care Now

## 2023-10-07 ENCOUNTER — Other Ambulatory Visit: Payer: Self-pay

## 2023-10-07 DIAGNOSIS — K529 Noninfective gastroenteritis and colitis, unspecified: Secondary | ICD-10-CM

## 2023-10-08 ENCOUNTER — Other Ambulatory Visit (HOSPITAL_BASED_OUTPATIENT_CLINIC_OR_DEPARTMENT_OTHER): Payer: Self-pay

## 2023-10-08 ENCOUNTER — Other Ambulatory Visit: Payer: Self-pay

## 2023-10-08 ENCOUNTER — Ambulatory Visit: Admitting: Psychiatry

## 2023-10-15 ENCOUNTER — Telehealth: Admitting: Physician Assistant

## 2023-10-15 ENCOUNTER — Other Ambulatory Visit (HOSPITAL_BASED_OUTPATIENT_CLINIC_OR_DEPARTMENT_OTHER): Payer: Self-pay

## 2023-10-15 DIAGNOSIS — B999 Unspecified infectious disease: Secondary | ICD-10-CM

## 2023-10-15 NOTE — Progress Notes (Signed)
  Because of ongoing symptoms despite treatment given via e-visit/virtual urgent care visit, I feel your condition warrants further evaluation and I recommend that you be seen in a face-to-face visit.   NOTE: There will be NO CHARGE for this E-Visit   If you are having a true medical emergency, please call 911.     For an urgent face to face visit, [facility] has multiple urgent care centers for your convenience.  Click the link below for the full list of locations and hours, walk-in wait times, appointment scheduling options and driving directions:  Urgent Care - [place], [place], [place], [place], [place], [place]  [place]     Your MyChart E-visit questionnaire answers were reviewed by a board certified advanced clinical practitioner to complete your personal care plan based on your specific symptoms.    Thank you for using e-Visits.

## 2023-10-16 ENCOUNTER — Ambulatory Visit

## 2023-10-16 ENCOUNTER — Other Ambulatory Visit (HOSPITAL_BASED_OUTPATIENT_CLINIC_OR_DEPARTMENT_OTHER): Payer: Self-pay

## 2023-10-17 ENCOUNTER — Other Ambulatory Visit: Payer: Self-pay

## 2023-10-17 ENCOUNTER — Other Ambulatory Visit (HOSPITAL_BASED_OUTPATIENT_CLINIC_OR_DEPARTMENT_OTHER): Payer: Self-pay

## 2023-10-24 ENCOUNTER — Other Ambulatory Visit (HOSPITAL_BASED_OUTPATIENT_CLINIC_OR_DEPARTMENT_OTHER): Payer: Self-pay

## 2023-10-29 ENCOUNTER — Other Ambulatory Visit: Payer: Self-pay

## 2023-10-29 ENCOUNTER — Other Ambulatory Visit (HOSPITAL_BASED_OUTPATIENT_CLINIC_OR_DEPARTMENT_OTHER): Payer: Self-pay

## 2023-11-03 ENCOUNTER — Other Ambulatory Visit (HOSPITAL_BASED_OUTPATIENT_CLINIC_OR_DEPARTMENT_OTHER): Payer: Self-pay

## 2023-11-04 ENCOUNTER — Other Ambulatory Visit: Payer: Self-pay

## 2023-11-04 ENCOUNTER — Telehealth: Payer: Self-pay | Admitting: Neurology

## 2023-11-04 NOTE — Telephone Encounter (Signed)
 Spoke to patient gave her sooner  appointment with Jessica,NP 11/06/2023 Pt aware to ask employer for extension for FMLA Made pt aware it can take up to 14 days to fill out FMLA paperwork. Pt expressed understanding and thanked me for calling

## 2023-11-04 NOTE — Telephone Encounter (Signed)
 Called and spoke to pt and stated that we have 14 business days to complete fmla and typically like to reevaluate pt prior to filling out again. Pt voiced understanding and stated that she was able to move up her reevaluation appt

## 2023-11-04 NOTE — Telephone Encounter (Signed)
 Patient calling to schedule an appointment for FMLA. Patient bring FMLA paperwork to the visit and pay $50. Also FMLA will be ending on 11/06/23, want to know if can FMLA leave paperwork can be filled out before scheduled appointment. Would like a call back.

## 2023-11-05 ENCOUNTER — Other Ambulatory Visit: Payer: Self-pay

## 2023-11-05 ENCOUNTER — Other Ambulatory Visit (HOSPITAL_BASED_OUTPATIENT_CLINIC_OR_DEPARTMENT_OTHER): Payer: Self-pay

## 2023-11-05 ENCOUNTER — Ambulatory Visit: Admitting: Psychiatry

## 2023-11-05 NOTE — Progress Notes (Unsigned)
 [facility] [address] [place].  [address] [phone]       OFFICE FOLLOW UP NOTE  Ms. Wendy Maynard Date of Birth:  Sep 17, 1975 Medical Record Number:  [id number]    Primary neurologist: Dr. Ines Reason for visit: Chronic migraine   Virtual Visit via Video Note  I connected with Wendy Maynard on 11/06/23 at  9:15 AM EDT by a video enabled telemedicine application and verified that I am speaking with the correct person using two identifiers.  Location: Patient: at home Provider: in office, [facility]   I discussed the limitations of evaluation and management by telemedicine and the availability of in person appointments. The patient expressed understanding and agreed to proceed.   SUBJECTIVE:   Follow-up visit:  Prior visit: 05/06/2023 with Dr. Ines  Brief HPI:   Wendy Maynard is a 48 y.o. female who is followed for management of chronic migraine headaches.  At initial visit in 07/2022 she noted worsening of her chronic left-sided headaches associated with peripheral visual changes and visual auras which is not uncommon.  She can also have dizziness, photophobia, phonophobia and nausea with her migraines.  At prior visit, she noted >50% reduction of migraines with monthly [name]  injections and continued sumatriptan  as needed. Dr. Ines assisting with FMLA.    Interval history:  Patient returns for follow-up visit via MyChart video visit.  Reports migraines remain well-controlled with monthly [name]  injections.  She had 1 migraine in July, 3 in August and 4 so far this month.  She believes increase in frequency due to weather changes.  She will use sumatriptan  with more severe migraines but does feel like she gets a rebound headache the following day after using.  She can occasionally have nausea and vomiting with her migraines.  She is requesting renewal for FMLA paperwork.      ROS:   14 system review of systems performed and negative with exception of  those listed in HPI  PMH:  Past Medical History:  Diagnosis Date   Abdominal pain in female patient 11/11/2012   Anxiety    Depression    Diabetes mellitus without complication (HCC)    Lyme disease    ~age 25   Migraines    Morbid (severe) obesity due to excess calories (HCC) 09/02/2023   bmi 45.5   Polycystic ovarian disease     PSH:  Past Surgical History:  Procedure Laterality Date   CHOLECYSTECTOMY N/A 02/26/2013   Procedure: LAPAROSCOPIC CHOLECYSTECTOMY WITH INTRAOPERATIVE CHOLANGIOGRAM;  Surgeon: Krystal JINNY Russell, MD;  Location: [facility];  Service: General;  Laterality: N/A;   HYSTERECTOMY ABDOMINAL WITH SALPINGECTOMY     WISDOM TOOTH EXTRACTION      Social History:  Social History   Socioeconomic History   Marital status: Single    Spouse name: Not on file   Number of children: 0   Years of education: Not on file   Highest education level: Master's degree (e.g., MA, MS, MEng, MEd, MSW, MBA)  Occupational History   Occupation: Retail buyer at [employer]  Tobacco Use   Smoking status: Former    Current packs/day: 0.00    Types: Cigarettes    Quit date: 01/2021    Years since quitting: 2.8   Smokeless tobacco: Never   Tobacco comments:    plans to start nicotine  gum  Vaping Use   Vaping status: Never Used  Substance and Sexual Activity   Alcohol use: Not Currently    Comment: Occasionally 1-2 x/mth  Drug use: No    Types: Marijuana    Comment: Last use 04/27/16   Sexual activity: Not Currently    Birth control/protection: Condom  Other Topics Concern   Not on file  Social History Narrative   Not on file   Social Drivers of Health   Financial Resource Strain: Not on file  Food Insecurity: Not on file  Transportation Needs: Not on file  Physical Activity: Not on file  Stress: Not on file  Social Connections: Not on file  Intimate Partner Violence: Not on file    Family History:  Family History  Problem Relation Age of Onset   Depression Mother         in response to grief/loss of husband   Colon polyps Mother    Hypertension Father        Died sudenly at age 64. Reports father may have had Aspergers   Migraines Father    Colon polyps Father    Other Father        esophageal spasms   Diverticulitis Father    Depression Sister    Anxiety disorder Sister    ADD / ADHD Sister    Migraines Sister    Other Sister        ileitis   Rectal cancer Paternal Aunt    Colon cancer Paternal Aunt        in her 87's   Alzheimer's disease Maternal Grandmother    Hypertension Maternal Grandmother    Stomach cancer Maternal Grandfather    Hypertension Paternal Grandmother    ADD / ADHD Cousin    Asperger's syndrome Cousin    Esophageal cancer Neg Hx     Medications:   Current Outpatient Medications on File Prior to Visit  Medication Sig Dispense Refill   ALPRAZolam  (XANAX ) 0.5 MG tablet Take 1 tablet (0.5 mg total) by mouth 2 (two) times daily. 60 tablet 5   ALPRAZolam  (XANAX ) 0.5 MG tablet Take 1 tablet (0.5 mg total) by mouth 3 (three) times daily as needed. 75 tablet 5   amphetamine -dextroamphetamine  (ADDERALL) 15 MG tablet Take 1 tablet by mouth 2 (two) times daily. 60 tablet 0   amphetamine -dextroamphetamine  (ADDERALL) 15 MG tablet Take 1 tablet by mouth 2 (two) times daily. 60 tablet 0   amphetamine -dextroamphetamine  (ADDERALL) 15 MG tablet Take 1 tablet by mouth 2 (two) times daily. 60 tablet 0   amphetamine -dextroamphetamine  (ADDERALL) 15 MG tablet Take 1 tablet by mouth 2 (two) times daily. 60 tablet 0   amphetamine -dextroamphetamine  (ADDERALL) 15 MG tablet Take 1 tablet by mouth 2 (two) times daily. 60 tablet 0   Ascorbic Acid (VITAMIN C) 100 MG tablet Take 100 mg by mouth daily.     busPIRone  (BUSPAR ) 15 MG tablet Take 1 tablet (15 mg total) by mouth 2 (two) times daily. 180 tablet 1   busPIRone  (BUSPAR ) 15 MG tablet Take 1 tablet (15 mg total) by mouth in the morning and at bedtime. 180 tablet 1   CALCIUM PO Take by mouth.      CHELATED MAGNESIUM  PO Take by mouth.     doxycycline  (VIBRA -TABS) 100 MG tablet Take 1 tablet (100 mg total) by mouth 2 (two) times daily. 20 tablet 0   estradiol  (VIVELLE -DOT) 0.1 MG/24HR patch Place 1 patch (0.1 mg total) onto the skin 2 (two) times a week. 24 patch 3   Fremanezumab -vfrm ([name] ) 225 MG/1.5ML SOAJ Inject 225 mg into the skin every 30 (thirty) days. 1.5 mL 11   gabapentin  (  NEURONTIN ) 300 MG capsule Take 1 capsule (300 mg total) by mouth 3 (three) times daily. 90 capsule 5   lidocaine  (LIDODERM ) 5 % Place 1 patch onto the skin daily. Keep patch on for 12 hours and remove for 12 hours 10 patch 0   metaxalone  (SKELAXIN ) 800 MG tablet Take 1 tablet (800 mg total) by mouth 3 (three) times daily as needed for muscle spasms. 30 tablet 1   Multiple Vitamin (MULTIVITAMIN) tablet Take 1 tablet by mouth daily.     Omega-3 Fatty Acids (FISH OIL ADULT GUMMIES PO) Take by mouth.     ondansetron  (ZOFRAN ) 4 MG tablet Take 1 tablet (4 mg total) by mouth every 6 (six) hours. 12 tablet 0   promethazine  (PHENERGAN ) 12.5 MG tablet Take 1 tablet (12.5 mg total) by mouth every 6 (six) hours as needed for nausea or vomiting. 30 tablet 0   QUEtiapine  (SEROQUEL ) 300 MG tablet Take 1 tablet (300 mg total) by mouth at bedtime. 90 tablet 1   QUEtiapine  (SEROQUEL ) 300 MG tablet Take 1 tablet (300 mg total) by mouth at bedtime. 90 tablet 1   sertraline  (ZOLOFT ) 100 MG tablet Take 1 tablet (100 mg total) by mouth 2 (two) times daily. 180 tablet 1   sertraline  (ZOLOFT ) 100 MG tablet Take 1 tablet (100 mg total) by mouth 2 (two) times daily. 180 tablet 1   SUMAtriptan  (IMITREX ) 100 MG tablet Take 1 tablet (100 mg total) by mouth once as needed for up to 1 dose. May repeat in 2 hours if headache persists or recurs. 10 tablet 12   vitamin B-12 (CYANOCOBALAMIN ) 100 MCG tablet Take 100 mcg by mouth daily.     zolpidem  (AMBIEN ) 10 MG tablet Take 1 tablet (10 mg total) by mouth at bedtime. 30 tablet 5   zolpidem   (AMBIEN ) 10 MG tablet Take 1 tablet (10 mg total) by mouth at bedtime. 8 tablet 0   Current Facility-Administered Medications on File Prior to Visit  Medication Dose Route Frequency Provider Last Rate Last Admin   0.9 %  sodium chloride  infusion  500 mL Intravenous Once Danis, Henry L III, MD       Fremanezumab -vfrm SOSY 225 mg  225 mg Subcutaneous Once Ahern, Antonia B, MD        Allergies:   Allergies  Allergen Reactions   Ciprofloxacin  Hives and Other (See Comments)    Started in arm as soon as drug started.   Clindamycin /Lincomycin Diarrhea and Other (See Comments)    Causes excessive diarrhea   Penicillins Rash and Other (See Comments)    Occurred at 48 years old Has patient had a PCN reaction causing immediate rash, facial/tongue/throat swelling, SOB or lightheadedness with hypotension: yes Has patient had a PCN reaction causing severe rash involving mucus membranes or skin necrosis: no Has patient had a PCN reaction that required hospitalization no Has patient had a PCN reaction occurring within the last 10 years:no If all of the above answers are NO, then may proceed with Cephalosporin use.    Sulfa Antibiotics Rash   Vicodin [Hydrocodone -Acetaminophen ] Itching and Nausea And Vomiting      OBJECTIVE:  Physical Exam   General: well developed, well nourished, very pleasant middle-age Caucasian female, seated, in no evident distress  Neurologic Exam Mental Status: Awake and fully alert. Oriented to place and time. Recent and remote memory intact. Attention span, concentration and fund of knowledge appropriate. Mood and affect appropriate.         ASSESSMENT/PLAN: Wendy Maynard is a 48 y.o. year old female      Chronic migraine headaches:  Migraines overall greatly controlled, some increase recently but possibly weather related Prevention: Continue [name]  monthly injection - rx up to date Rescue: Recommend trying rizatriptan  10 mg as needed, can repeat after 2  hours if needed.  Experiencing rebound headache on sumatriptan .  If no benefit, can consider Nurtec or Ubrelvy. Will send in rx for Zofran  for occasional nausea and vomiting associated with migraine. Will continue to assist with FMLA allowing for 2 days per month - needs updated paperwork completed Previously tried/failed (per list provided by Dr. Ines): Acetaminophen , Flexeril , diclofenac , Benadryl , Relpax , Toradol , Mobic , Robaxin, Reglan  injections, Zofran  oral and injections, oxycodone , Compazine  injections, Phenergan  injections, tramadol , sumatriptan , Abilify , duloxetine , fluoxetine, gabapentin , hydroxyzine , propranolol , Seroquel , Zoloft , topiramate , [name] , Aimovig contraindicated due to constipation, amitriptyline and nortriptyline contraindicated because she is already on Zoloft  and this can cause serotonin syndrome      Follow up in 6 months or call earlier if needed   CC:  PCP: Almarie Waddell NOVAK, NP    I personally spent a total of 25 minutes in the care of the patient today including preparing to see the patient, counseling and educating, placing orders, and documenting clinical information in the EHR.  Harlene Bogaert, AGNP-BC  [facility] [address] [place], [place] [address]  Phone [phone] Fax [phone] Note: This document was prepared with digital dictation and possible smart phrase technology. Any transcriptional errors that result from this process are unintentional.

## 2023-11-06 ENCOUNTER — Telehealth (INDEPENDENT_AMBULATORY_CARE_PROVIDER_SITE_OTHER): Admitting: Adult Health

## 2023-11-06 ENCOUNTER — Other Ambulatory Visit (HOSPITAL_BASED_OUTPATIENT_CLINIC_OR_DEPARTMENT_OTHER): Payer: Self-pay

## 2023-11-06 ENCOUNTER — Encounter: Payer: Self-pay | Admitting: Adult Health

## 2023-11-06 DIAGNOSIS — G43709 Chronic migraine without aura, not intractable, without status migrainosus: Secondary | ICD-10-CM

## 2023-11-06 MED ORDER — ONDANSETRON 4 MG PO TBDP
4.0000 mg | ORAL_TABLET | Freq: Three times a day (TID) | ORAL | 11 refills | Status: AC | PRN
  Filled 2023-11-06 – 2023-11-26 (×2): qty 20, 7d supply, fill #0
  Filled 2024-01-14: qty 20, 7d supply, fill #1
  Filled 2024-02-23: qty 20, 7d supply, fill #2

## 2023-11-06 MED ORDER — RIZATRIPTAN BENZOATE 10 MG PO TBDP
10.0000 mg | ORAL_TABLET | ORAL | 11 refills | Status: AC | PRN
  Filled 2023-11-06: qty 9, 30d supply, fill #0
  Filled 2023-11-26: qty 9, 18d supply, fill #0
  Filled 2024-01-29: qty 9, 18d supply, fill #1

## 2023-11-17 ENCOUNTER — Other Ambulatory Visit (HOSPITAL_BASED_OUTPATIENT_CLINIC_OR_DEPARTMENT_OTHER): Payer: Self-pay

## 2023-11-19 ENCOUNTER — Ambulatory Visit: Admitting: Psychiatry

## 2023-11-19 DIAGNOSIS — F411 Generalized anxiety disorder: Secondary | ICD-10-CM

## 2023-11-19 NOTE — Progress Notes (Unsigned)
      [facility] Counselor/Therapist Progress Note  Patient ID: Wendy Maynard, MRN: [id number],    Date: 11/19/2023  Time Spent: 55 minutes start time 4:51 PM end time 5:46 PM  Treatment Type: Individual Therapy  Reported Symptoms: anxiety, sadness, health issues, triggered responses, nightmares,panic, rumination, intrusive thoughts, flashbacks  Mental Status Exam:  Appearance:   Casual     Behavior:  Appropriate  Motor:  Normal  Speech/Language:   Normal Rate  Affect:  Appropriate  Mood:  sad  Thought process:  normal  Thought content:    WNL  Sensory/Perceptual disturbances:    WNL  Orientation:  oriented to person, place, time/date, and situation  Attention:  Good  Concentration:  Good  Memory:  WNL  Fund of knowledge:   Good  Insight:    Good  Judgment:   Good  Impulse Control:  Good   Risk Assessment: Danger to Self:  No Self-injurious Behavior: No Danger to Others: No Duty to Warn:no Physical Aggression / Violence:No  Access to Firearms a concern: No  Gang Involvement:No   Subjective: Patient was present for session. She shared that her supervisor passed away recently and that has been hard for her. She also has been sick a lot and had a polyp removed but is still having stomach issues. She did share that she has a Writer and it seems to be okay. Her sister also had her husband let her know that she had blocked her and did not want to have contact with her.  She shared she is having dreams about her sister that are disturbing.  Patient discussed the situation that happened with her sister and while she felt that she was having the dreams.  Updated treatment plan and goals in session with patient.  Discussed DBT skill TIP P and ST OP with patient.  Encouraged patient to think through ways to continue to keep herself safe and how to remind herself that she is safe when she is at work and at home.  Interventions: Dialectical Behavioral Therapy and  Insight-Oriented  Diagnosis:   ICD-10-CM   1. Generalized anxiety disorder  F41.1       Plan:  Patient is to work on using coping skills more regularly to try and stay grounded.  Patient is to practice DBT skills TIP P and ST OP.Patient is to continue attending recovery group and working on her 4th step.  Patient is to practice self spotting exercise to calm herself.  Patient is to keep herself engaged in positive activities to decrease rumination on triggered responses.  Patient is to work on journaling to release negative emotions appropriately.  Patient is to work on using theta music to go to sleep at night.  Patient is to take medication as directed.   Silvano Pacini, [facility]

## 2023-11-20 ENCOUNTER — Encounter: Payer: Self-pay | Admitting: Adult Health

## 2023-11-21 ENCOUNTER — Other Ambulatory Visit (HOSPITAL_BASED_OUTPATIENT_CLINIC_OR_DEPARTMENT_OTHER): Payer: Self-pay

## 2023-11-21 ENCOUNTER — Other Ambulatory Visit: Payer: Self-pay

## 2023-11-24 ENCOUNTER — Ambulatory Visit: Admitting: Gastroenterology

## 2023-11-24 ENCOUNTER — Encounter: Payer: Self-pay | Admitting: Gastroenterology

## 2023-11-24 ENCOUNTER — Other Ambulatory Visit (HOSPITAL_BASED_OUTPATIENT_CLINIC_OR_DEPARTMENT_OTHER): Payer: Self-pay

## 2023-11-24 VITALS — BP 122/78 | HR 83 | Ht 66.0 in | Wt 281.0 lb

## 2023-11-24 DIAGNOSIS — Z860101 Personal history of adenomatous and serrated colon polyps: Secondary | ICD-10-CM | POA: Diagnosis not present

## 2023-11-24 DIAGNOSIS — R110 Nausea: Secondary | ICD-10-CM

## 2023-11-24 DIAGNOSIS — K529 Noninfective gastroenteritis and colitis, unspecified: Secondary | ICD-10-CM

## 2023-11-24 DIAGNOSIS — K31A0 Gastric intestinal metaplasia, unspecified: Secondary | ICD-10-CM

## 2023-11-24 MED ORDER — COLESTIPOL HCL 1 G PO TABS
1.0000 g | ORAL_TABLET | Freq: Two times a day (BID) | ORAL | 2 refills | Status: DC
  Filled 2023-11-24: qty 30, 15d supply, fill #0
  Filled 2023-12-18: qty 30, 15d supply, fill #1
  Filled 2024-01-29: qty 30, 15d supply, fill #2

## 2023-11-24 NOTE — Patient Instructions (Signed)
 We have sent the following medications to your pharmacy for you to pick up at your convenience: Colestipol - take one tablet daily  Your provider has requested that you go to the basement level for lab work before leaving today. Press B on the elevator. The lab is located at the first door on the left as you exit the elevator.   _______________________________________________________  If your blood pressure at your visit was 140/90 or greater, please contact your primary care physician to follow up on this.  _______________________________________________________  If you are age 47 or older, your body mass index should be between 23-30. Your Body mass index is 45.35 kg/m. If this is out of the aforementioned range listed, please consider follow up with your Primary Care Provider.  If you are age 25 or younger, your body mass index should be between 19-25. Your Body mass index is 45.35 kg/m. If this is out of the aformentioned range listed, please consider follow up with your Primary Care Provider.   ________________________________________________________  The [facility] providers would like to encourage you to use MYCHART to communicate with providers for non-urgent requests or questions.  Due to long hold times on the telephone, sending your provider a message by [facility] may be a faster and more efficient way to get a response.  Please allow 48 business hours for a response.  Please remember that this is for non-urgent requests.  _______________________________________________________  [facility] is using a team-based approach to care.  Your team is made up of your doctor and two to three APPS. Our APPS (Nurse Practitioners and Physician Assistants) work with your physician to ensure care continuity for you. They are fully qualified to address your health concerns and develop a treatment plan. They communicate directly with your gastroenterologist to care for you. Seeing the  Advanced Practice Practitioners on your physician's team can help you by facilitating care more promptly, often allowing for earlier appointments, access to diagnostic testing, procedures, and other specialty referrals.    Thank you for trusting me with your gastrointestinal care!    Dr. Victory Legrand DOUGLAS [facility]

## 2023-11-24 NOTE — Progress Notes (Signed)
 [facility] Progress Note  Chief Complaint: Epigastric pain, nausea and diarrhea  Subjective  Prior history  APP office consult July 2025 for burning epigastric discomfort and change in bowel habits with several semiformed to loose BMs per day as well as iron deficiency anemia. No stool studies performed  August 2025 EGD and colonoscopy: EGD to second portion duodenum with gastric mucosal bile staining, otherwise normal mucosa.  H. pylori negative with incidental focal intestinal metaplasia.   Duodenal biopsies negative for celiac Colonoscopy to terminal ileum without ileitis or visible colitis.  Biopsies negative for microscopic colitis.  10 mm pedunculated transverse colon tubular adenoma (3-year recall), diverticulosis Afterward recommended stool for C. difficile PCR and elastase  History of Present Illness  Wendy Maynard follows up today and tells me that her epigastric discomfort and nausea have improved after the procedure, though she is not sure why.  She is still experiencing diarrhea, with 4-5 BMs per day, up from 2 or 3 that she would have considered her normal about 6 months ago.  Stools are often semiformed or loose with some urgency but no blood. She does not recall any antibiotic use earlier this year prior to this change in bowel habits.  ROS: Cardiovascular:  no chest pain Respiratory: no dyspnea Anxiety (mood lately stable) Remainder of systems negative except as above The patient's Past Medical, Family and Social History were reviewed and are on file in the EMR. Past Medical History:  Diagnosis Date   Abdominal pain in female patient 11/11/2012   Anxiety    Depression    Diabetes mellitus without complication (HCC)    Lyme disease    ~age 10   Migraines    Morbid (severe) obesity due to excess calories (HCC) 09/02/2023   bmi 45.5   Polycystic ovarian disease     Past Surgical History:  Procedure Laterality Date   CHOLECYSTECTOMY N/A 02/26/2013   Procedure:  LAPAROSCOPIC CHOLECYSTECTOMY WITH INTRAOPERATIVE CHOLANGIOGRAM;  Surgeon: Krystal JINNY Russell, MD;  Location: [facility];  Service: General;  Laterality: N/A;   HYSTERECTOMY ABDOMINAL WITH SALPINGECTOMY     WISDOM TOOTH EXTRACTION       Objective:  Med list reviewed  Current Outpatient Medications:    ALPRAZolam  (XANAX ) 0.5 MG tablet, Take 1 tablet (0.5 mg total) by mouth 2 (two) times daily., Disp: 60 tablet, Rfl: 5   ALPRAZolam  (XANAX ) 0.5 MG tablet, Take 1 tablet (0.5 mg total) by mouth 3 (three) times daily as needed., Disp: 75 tablet, Rfl: 5   amphetamine -dextroamphetamine  (ADDERALL) 15 MG tablet, Take 1 tablet by mouth 2 (two) times daily., Disp: 60 tablet, Rfl: 0   amphetamine -dextroamphetamine  (ADDERALL) 15 MG tablet, Take 1 tablet by mouth 2 (two) times daily., Disp: 60 tablet, Rfl: 0   amphetamine -dextroamphetamine  (ADDERALL) 15 MG tablet, Take 1 tablet by mouth 2 (two) times daily., Disp: 60 tablet, Rfl: 0   amphetamine -dextroamphetamine  (ADDERALL) 15 MG tablet, Take 1 tablet by mouth 2 (two) times daily., Disp: 60 tablet, Rfl: 0   amphetamine -dextroamphetamine  (ADDERALL) 15 MG tablet, Take 1 tablet by mouth 2 (two) times daily., Disp: 60 tablet, Rfl: 0   busPIRone  (BUSPAR ) 15 MG tablet, Take 1 tablet (15 mg total) by mouth 2 (two) times daily., Disp: 180 tablet, Rfl: 1   busPIRone  (BUSPAR ) 15 MG tablet, Take 1 tablet (15 mg total) by mouth in the morning and at bedtime., Disp: 180 tablet, Rfl: 1   colestipol (COLESTID) 1 g tablet, Take 1 tablet (1 g total) by  mouth 2 (two) times daily., Disp: 30 tablet, Rfl: 2   doxycycline  (VIBRA -TABS) 100 MG tablet, Take 1 tablet (100 mg total) by mouth 2 (two) times daily., Disp: 20 tablet, Rfl: 0   estradiol  (VIVELLE -DOT) 0.1 MG/24HR patch, Place 1 patch (0.1 mg total) onto the skin 2 (two) times a week., Disp: 24 patch, Rfl: 3   Fremanezumab -vfrm ([name] ) 225 MG/1.5ML SOAJ, Inject 225 mg into the skin every 30 (thirty) days., Disp: 1.5 mL, Rfl: 11    gabapentin  (NEURONTIN ) 300 MG capsule, Take 1 capsule (300 mg total) by mouth 3 (three) times daily., Disp: 90 capsule, Rfl: 5   lidocaine  (LIDODERM ) 5 %, Place 1 patch onto the skin daily. Keep patch on for 12 hours and remove for 12 hours, Disp: 10 patch, Rfl: 0   metaxalone  (SKELAXIN ) 800 MG tablet, Take 1 tablet (800 mg total) by mouth 3 (three) times daily as needed for muscle spasms., Disp: 30 tablet, Rfl: 1   ondansetron  (ZOFRAN -ODT) 4 MG disintegrating tablet, Take 1 tablet (4 mg total) by mouth every 8 (eight) hours as needed., Disp: 20 tablet, Rfl: 11   QUEtiapine  (SEROQUEL ) 300 MG tablet, Take 1 tablet (300 mg total) by mouth at bedtime., Disp: 90 tablet, Rfl: 1   QUEtiapine  (SEROQUEL ) 300 MG tablet, Take 1 tablet (300 mg total) by mouth at bedtime., Disp: 90 tablet, Rfl: 1   rizatriptan  (MAXALT -MLT) 10 MG disintegrating tablet, Take 1 tablet (10 mg total) by mouth as needed for migraine. May repeat in 2 hours if needed, Disp: 9 tablet, Rfl: 11   sertraline  (ZOLOFT ) 100 MG tablet, Take 1 tablet (100 mg total) by mouth 2 (two) times daily., Disp: 180 tablet, Rfl: 1   sertraline  (ZOLOFT ) 100 MG tablet, Take 1 tablet (100 mg total) by mouth 2 (two) times daily., Disp: 180 tablet, Rfl: 1   zolpidem  (AMBIEN ) 10 MG tablet, Take 1 tablet (10 mg total) by mouth at bedtime., Disp: 30 tablet, Rfl: 5   zolpidem  (AMBIEN ) 10 MG tablet, Take 1 tablet (10 mg total) by mouth at bedtime., Disp: 8 tablet, Rfl: 0  Current Facility-Administered Medications:    0.9 %  sodium chloride  infusion, 500 mL, Intravenous, Once, Danis, Wendy CROME III, MD   Fremanezumab -vfrm SOSY 225 mg, 225 mg, Subcutaneous, Once, Ines Onetha NOVAK, MD   Vital signs in last 24 hrs: Vitals:   11/24/23 1525  BP: 122/78  Pulse: 83   Wt Readings from Last 3 Encounters:  11/24/23 281 lb (127.5 kg)  09/25/23 286 lb (129.7 kg)  09/02/23 286 lb 4 oz (129.8 kg)    Physical Exam  Well-appearing HEENT: sclera anicteric, oral mucosa  moist without lesions Neck: supple, no thyromegaly, JVD or lymphadenopathy Cardiac: Regular without appreciable murmur,  no peripheral edema Pulm: clear to auscultation bilaterally, normal RR and effort noted Abdomen: soft, no tenderness, with active bowel sounds. No guarding or palpable hepatosplenomegaly. Skin; warm and dry, no jaundice or rash   Labs:   ___________________________________________ Radiologic studies:   ____________________________________________ Other:  1. Surgical [P], colon nos, random sites :      - COLONIC MUCOSA WITH NO SPECIFIC HISTOPATHOLOGIC CHANGES      - NEGATIVE FOR ACUTE INFLAMMATION, INCREASED INTRAEPITHELIAL LYMPHOCYTES OR      THICKENED SUBEPITHELIAL COLLAGEN TABLE       2. Surgical [P], colon, transverse, polyp (1) :      - TUBULAR ADENOMA      - NEGATIVE FOR HIGH-GRADE DYSPLASIA OR MALIGNANCY  3. Surgical [P], duodenal biopsies :      - DUODENAL MUCOSA WITH NO SPECIFIC HISTOPATHOLOGIC CHANGES      - NEGATIVE FOR INCREASED INTRAEPITHELIAL LYMPHOCYTES OR VILLOUS ARCHITECTURAL      CHANGES       4. Surgical [P], gastric biopsies :      - GASTRIC ANTRAL MUCOSA WITH MILD CHRONIC GASTRITIS WITH INTESTINAL METAPLASIA      - NEGATIVE FOR DYSPLASIA      - HELICOBACTER PYLORI-LIKE ORGANISMS ARE NOT IDENTIFIED ON ROUTINE H&E STAIN  _____________________________________________   Encounter Diagnoses  Name Primary?   Chronic diarrhea Yes   Nausea in adult    Assessment & Plan  Her upper GI symptoms have lately improved, and endoscopic evaluation was unrevealing. Incidental focal gastric intestinal metaplasia that needs a recall EGD in 1 year for more extensive biopsy/gastric mapping.  Diarrhea for about the last 6 months, negative for microscopic colitis.  No risk factors for SIBO.  Possible IBS, less likely infectious or EPI.  Could be bile acid diarrhea, though cholecystectomy was many years ago.  Plan: 1 year EGD recall set Stool  for C. difficile PCR, ova and parasites and elastase Trial of colestipol 1 gram once daily.  She takes several medicines twice daily but think she could manage taking the colestipol midmorning because it would be at least 2 hours away from her morning and approximately 1 PM medication doses.  If this low-dose of bile acid binding agent does not help, her options would be to go up to 2 g once a day for change treatment and try an antispasmodic.  30 minutes were spent on this encounter (including chart review, history/exam, counseling/coordination of care, and documentation) > 50% of that time was spent on counseling and coordination of care.   Wendy Maynard

## 2023-11-26 ENCOUNTER — Other Ambulatory Visit (HOSPITAL_BASED_OUTPATIENT_CLINIC_OR_DEPARTMENT_OTHER): Payer: Self-pay

## 2023-11-26 MED ORDER — ZOLPIDEM TARTRATE 10 MG PO TABS
10.0000 mg | ORAL_TABLET | Freq: Every day | ORAL | 4 refills | Status: AC
  Filled 2023-11-26: qty 30, 30d supply, fill #0
  Filled 2023-12-18 (×4): qty 30, 30d supply, fill #1
  Filled 2024-01-14: qty 30, 30d supply, fill #2
  Filled 2024-01-29 – 2024-02-11 (×2): qty 30, 30d supply, fill #3

## 2023-12-02 ENCOUNTER — Other Ambulatory Visit (HOSPITAL_BASED_OUTPATIENT_CLINIC_OR_DEPARTMENT_OTHER): Payer: Self-pay

## 2023-12-02 ENCOUNTER — Other Ambulatory Visit: Payer: Self-pay

## 2023-12-03 ENCOUNTER — Other Ambulatory Visit (HOSPITAL_COMMUNITY): Payer: Self-pay

## 2023-12-03 NOTE — Telephone Encounter (Signed)
 FMLA form received. Mychart message sent to patient regarding form fee and questionnaire

## 2023-12-05 ENCOUNTER — Other Ambulatory Visit (HOSPITAL_BASED_OUTPATIENT_CLINIC_OR_DEPARTMENT_OTHER): Payer: Self-pay

## 2023-12-05 ENCOUNTER — Other Ambulatory Visit: Payer: Self-pay

## 2023-12-05 DIAGNOSIS — F900 Attention-deficit hyperactivity disorder, predominantly inattentive type: Secondary | ICD-10-CM | POA: Diagnosis not present

## 2023-12-05 DIAGNOSIS — F4310 Post-traumatic stress disorder, unspecified: Secondary | ICD-10-CM | POA: Diagnosis not present

## 2023-12-05 DIAGNOSIS — G4700 Insomnia, unspecified: Secondary | ICD-10-CM | POA: Diagnosis not present

## 2023-12-05 DIAGNOSIS — F410 Panic disorder [episodic paroxysmal anxiety] without agoraphobia: Secondary | ICD-10-CM | POA: Diagnosis not present

## 2023-12-05 DIAGNOSIS — F411 Generalized anxiety disorder: Secondary | ICD-10-CM | POA: Diagnosis not present

## 2023-12-05 DIAGNOSIS — F339 Major depressive disorder, recurrent, unspecified: Secondary | ICD-10-CM | POA: Diagnosis not present

## 2023-12-05 MED ORDER — BUSPIRONE HCL 15 MG PO TABS
15.0000 mg | ORAL_TABLET | Freq: Two times a day (BID) | ORAL | 1 refills | Status: AC
  Filled 2023-12-05: qty 180, 90d supply, fill #0

## 2023-12-05 MED ORDER — AMPHETAMINE-DEXTROAMPHETAMINE 15 MG PO TABS
15.0000 mg | ORAL_TABLET | Freq: Two times a day (BID) | ORAL | 0 refills | Status: AC
  Filled 2023-12-27: qty 60, 30d supply, fill #0

## 2023-12-05 MED ORDER — AMPHETAMINE-DEXTROAMPHETAMINE 15 MG PO TABS
15.0000 mg | ORAL_TABLET | Freq: Two times a day (BID) | ORAL | 0 refills | Status: AC
  Filled 2024-02-27: qty 60, 30d supply, fill #0

## 2023-12-05 MED ORDER — GABAPENTIN 300 MG PO CAPS
300.0000 mg | ORAL_CAPSULE | Freq: Three times a day (TID) | ORAL | 5 refills | Status: AC
  Filled 2024-01-14: qty 90, 30d supply, fill #0

## 2023-12-05 MED ORDER — AMPHETAMINE-DEXTROAMPHETAMINE 15 MG PO TABS
15.0000 mg | ORAL_TABLET | Freq: Two times a day (BID) | ORAL | 0 refills | Status: AC
  Filled 2024-01-27: qty 60, 30d supply, fill #0

## 2023-12-05 MED ORDER — SERTRALINE HCL 100 MG PO TABS
100.0000 mg | ORAL_TABLET | Freq: Two times a day (BID) | ORAL | 1 refills | Status: AC
  Filled 2023-12-05: qty 180, 90d supply, fill #0

## 2023-12-05 MED ORDER — QUETIAPINE FUMARATE 300 MG PO TABS
300.0000 mg | ORAL_TABLET | Freq: Every day | ORAL | 1 refills | Status: AC
  Filled 2023-12-05 – 2024-01-27 (×4): qty 90, 90d supply, fill #0
  Filled 2024-03-10: qty 90, 90d supply, fill #1

## 2023-12-06 ENCOUNTER — Other Ambulatory Visit (HOSPITAL_BASED_OUTPATIENT_CLINIC_OR_DEPARTMENT_OTHER): Payer: Self-pay

## 2023-12-10 ENCOUNTER — Ambulatory Visit: Admitting: Adult Health

## 2023-12-14 ENCOUNTER — Other Ambulatory Visit: Payer: Self-pay | Admitting: Medical Genetics

## 2023-12-14 DIAGNOSIS — Z006 Encounter for examination for normal comparison and control in clinical research program: Secondary | ICD-10-CM

## 2023-12-15 ENCOUNTER — Other Ambulatory Visit (HOSPITAL_BASED_OUTPATIENT_CLINIC_OR_DEPARTMENT_OTHER): Payer: Self-pay

## 2023-12-18 ENCOUNTER — Other Ambulatory Visit (HOSPITAL_BASED_OUTPATIENT_CLINIC_OR_DEPARTMENT_OTHER): Payer: Self-pay

## 2023-12-18 ENCOUNTER — Other Ambulatory Visit: Payer: Self-pay

## 2023-12-24 ENCOUNTER — Telehealth: Payer: Self-pay

## 2023-12-24 NOTE — Telephone Encounter (Signed)
 There are refills on file at [facility]. Sending to PA team.

## 2023-12-24 NOTE — Telephone Encounter (Signed)
 Correction, form brought to POD 4 for completion . Please call and advise patient once completed.

## 2023-12-24 NOTE — Telephone Encounter (Signed)
 Patient paid form fee and completed questionnaire. Brought to POD 3 for completion

## 2023-12-24 NOTE — Telephone Encounter (Signed)
 We received a call from this patient regarding her Fremanezumab -vfrm ([name] ) 225 MG/1.5ML SOAJ . She advised that a PA is needed for this medication. Per review of her chart, it looks like the prior PA expired in September. Can a new one be started for her? Without the PA, the medication goes from $15/month to $160/month.  She would like the refill sent to [facility]

## 2023-12-25 ENCOUNTER — Other Ambulatory Visit (HOSPITAL_BASED_OUTPATIENT_CLINIC_OR_DEPARTMENT_OTHER): Payer: Self-pay

## 2023-12-25 ENCOUNTER — Other Ambulatory Visit (HOSPITAL_COMMUNITY): Payer: Self-pay

## 2023-12-25 ENCOUNTER — Telehealth: Payer: Self-pay

## 2023-12-25 DIAGNOSIS — G43709 Chronic migraine without aura, not intractable, without status migrainosus: Secondary | ICD-10-CM

## 2023-12-25 DIAGNOSIS — Z0289 Encounter for other administrative examinations: Secondary | ICD-10-CM

## 2023-12-25 MED ORDER — AJOVY 225 MG/1.5ML ~~LOC~~ SOAJ
225.0000 mg | SUBCUTANEOUS | 11 refills | Status: AC
  Filled 2023-12-25: qty 1.5, 30d supply, fill #0
  Filled 2024-01-26: qty 1.5, 30d supply, fill #1
  Filled 2024-02-23: qty 1.5, 30d supply, fill #2

## 2023-12-25 NOTE — Telephone Encounter (Signed)
 Thi was a patient of Dr. Katherine we have another provider write rx so I can submit new PA please-Thank you so much.

## 2023-12-25 NOTE — Telephone Encounter (Signed)
 Pt most recently saw Harlene M,NP 10/2023. Sent in rx [name]  refill under her name. Pt next appt f/u 06/01/24.

## 2023-12-26 ENCOUNTER — Other Ambulatory Visit (HOSPITAL_COMMUNITY): Payer: Self-pay

## 2023-12-26 NOTE — Telephone Encounter (Signed)
 Pharmacy Patient Advocate Encounter   Received notification from Physician's Office that prior authorization for [name]  225mg /1.31ml autoinjector is required/requested.   Insurance verification completed.   The patient is insured through [facility].   Per test claim: PA required; PA started via CoverMyMeds. KEY [id number] . Waiting for clinical questions to populate.

## 2023-12-26 NOTE — Telephone Encounter (Signed)
 Clinical questions have been answered and PA submitted. PA currently Pending. Please be advised that most companies allow up to 30 days to make a decision. We will advise when a determination has been made, or follow up in 1 week.   Please reach out to our team, Rx Prior Auth Pool, if you haven't heard back in a week.

## 2023-12-27 ENCOUNTER — Other Ambulatory Visit (HOSPITAL_BASED_OUTPATIENT_CLINIC_OR_DEPARTMENT_OTHER): Payer: Self-pay

## 2023-12-29 ENCOUNTER — Other Ambulatory Visit (HOSPITAL_COMMUNITY): Payer: Self-pay

## 2023-12-29 NOTE — Telephone Encounter (Signed)
 Pharmacy Patient Advocate Encounter  Received notification from [employer] that Prior Authorization for [name]  has been APPROVED from 12/26/2023 to 12/24/2024. Ran test claim, Copay is $24.98. This test claim was processed through [facility]- copay amounts may vary at other pharmacies due to pharmacy/plan contracts, or as the patient moves through the different stages of their insurance plan.   PA #/Case ID/Reference #: [id number]

## 2023-12-30 ENCOUNTER — Other Ambulatory Visit: Payer: Self-pay

## 2023-12-30 ENCOUNTER — Other Ambulatory Visit (HOSPITAL_BASED_OUTPATIENT_CLINIC_OR_DEPARTMENT_OTHER): Payer: Self-pay

## 2023-12-30 ENCOUNTER — Encounter (HOSPITAL_BASED_OUTPATIENT_CLINIC_OR_DEPARTMENT_OTHER): Payer: Self-pay

## 2023-12-31 ENCOUNTER — Telehealth: Admitting: Physician Assistant

## 2023-12-31 DIAGNOSIS — H109 Unspecified conjunctivitis: Secondary | ICD-10-CM | POA: Diagnosis not present

## 2023-12-31 MED ORDER — NEOMYCIN-POLYMYXIN-DEXAMETH 3.5-10000-0.1 OP SUSP: 1.0000 [drp] | Freq: Four times a day (QID) | OPHTHALMIC | 0 refills | Status: AC

## 2023-12-31 NOTE — Progress Notes (Signed)
 Virtual Visit Consent   Ratasha Mcconico, you are scheduled for a virtual visit with a [facility] provider today. Just as with appointments in the office, your consent must be obtained to participate. Your consent will be active for this visit and any virtual visit you may have with one of our providers in the next 365 days. If you have a MyChart account, a copy of this consent can be sent to you electronically.  As this is a virtual visit, video technology does not allow for your provider to perform a traditional examination. This may limit your provider's ability to fully assess your condition. If your provider identifies any concerns that need to be evaluated in person or the need to arrange testing (such as labs, EKG, etc.), we will make arrangements to do so. Although advances in technology are sophisticated, we cannot ensure that it will always work on either your end or our end. If the connection with a video visit is poor, the visit may have to be switched to a telephone visit. With either a video or telephone visit, we are not always able to ensure that we have a secure connection.  By engaging in this virtual visit, you consent to the provision of healthcare and authorize for your insurance to be billed (if applicable) for the services provided during this visit. Depending on your insurance coverage, you may receive a charge related to this service.  I need to obtain your verbal consent now. Are you willing to proceed with your visit today? Marquisa Iten has provided verbal consent on 12/31/2023 for a virtual visit (video or telephone). [name], PA-C  Date: 12/31/2023 2:58 PM   Virtual Visit via Video Note   I, [name], connected with  Samiha Denapoli  ([id number], 04/07/1975) on 12/31/23 at  2:45 PM EST by a video-enabled telemedicine application and verified that I am speaking with the correct person using two identifiers.  Location: Patient: Virtual Visit Location Patient:  Home Provider: Virtual Visit Location Provider: Home Office   I discussed the limitations of evaluation and management by telemedicine and the availability of in person appointments. The patient expressed understanding and agreed to proceed.    History of Present Illness: Lessly Romano is a 48 y.o. who identifies as a female who was assigned female at birth, and is being seen today for red eye.  HPI: Eye Pain  The left eye is affected. This is a new problem. The current episode started today. The problem occurs constantly. The problem has been gradually worsening. There was no injury mechanism. The pain is mild (feels achy like she was hit in the eye the day before type feeling). There is No known exposure to pink eye. She Does not wear contacts. Associated symptoms include blurred vision (only in peripheral field, not all over), eye redness and a foreign body sensation (feels dry and gritty). Pertinent negatives include no eye discharge, double vision, fever, itching, nausea, photophobia or recent URI. She has tried commercial eye wash (repHresh eye drops, tylenol ) for the symptoms. The treatment provided no relief.     Problems:  Patient Active Problem List   Diagnosis Date Noted   Change in bowel habits 09/02/2023   NSAID long-term use 09/02/2023   Nausea and vomiting 09/02/2023   Iron deficiency anemia 09/02/2023   Thalassemia minor 05/23/2023   Hyperglycemia 05/23/2023   B12 deficiency 05/23/2023   Migraine with aura and without status migrainosus, not intractable 03/29/2021   Chronic pain of  right knee 03/29/2021   Morbid obesity with body mass index (BMI) of 40.0 to 44.9 in adult (HCC) 03/29/2021   OSA on CPAP 03/29/2021   Postmenopausal hormone replacement therapy 03/29/2021   Insomnia 12/07/2020   ADHD, predominantly inattentive type 02/06/2018   Posttraumatic stress disorder 02/06/2018   MDD (major depressive disorder), recurrent severe, without psychosis (HCC) 04/28/2016     Allergies:  Allergies  Allergen Reactions   Ciprofloxacin  Hives and Other (See Comments)    Started in arm as soon as drug started.   Clindamycin /Lincomycin Diarrhea and Other (See Comments)    Causes excessive diarrhea   Penicillins Rash and Other (See Comments)    Occurred at 48 years old Has patient had a PCN reaction causing immediate rash, facial/tongue/throat swelling, SOB or lightheadedness with hypotension: yes Has patient had a PCN reaction causing severe rash involving mucus membranes or skin necrosis: no Has patient had a PCN reaction that required hospitalization no Has patient had a PCN reaction occurring within the last 10 years:no If all of the above answers are NO, then may proceed with Cephalosporin use.    Sulfa Antibiotics Rash   Vicodin [Hydrocodone -Acetaminophen ] Itching and Nausea And Vomiting   Medications:  Current Outpatient Medications:    neomycin-polymyxin b-dexamethasone  (MAXITROL) 3.5-10000-0.1 SUSP, Place 1 drop into the left eye every 6 (six) hours for 5 days., Disp: 5 mL, Rfl: 0   ALPRAZolam  (XANAX ) 0.5 MG tablet, Take 1 tablet (0.5 mg total) by mouth 2 (two) times daily., Disp: 60 tablet, Rfl: 5   ALPRAZolam  (XANAX ) 0.5 MG tablet, Take 1 tablet (0.5 mg total) by mouth 3 (three) times daily as needed., Disp: 75 tablet, Rfl: 5   amphetamine -dextroamphetamine  (ADDERALL) 15 MG tablet, Take 1 tablet by mouth 2 (two) times daily., Disp: 60 tablet, Rfl: 0   amphetamine -dextroamphetamine  (ADDERALL) 15 MG tablet, Take 1 tablet by mouth 2 (two) times daily., Disp: 60 tablet, Rfl: 0   amphetamine -dextroamphetamine  (ADDERALL) 15 MG tablet, Take 1 tablet by mouth 2 (two) times daily., Disp: 60 tablet, Rfl: 0   amphetamine -dextroamphetamine  (ADDERALL) 15 MG tablet, Take 1 tablet by mouth 2 (two) times daily., Disp: 60 tablet, Rfl: 0   amphetamine -dextroamphetamine  (ADDERALL) 15 MG tablet, Take 1 tablet by mouth 2 (two) times daily., Disp: 60 tablet, Rfl: 0   [START  ON 02/13/2024] amphetamine -dextroamphetamine  (ADDERALL) 15 MG tablet, Take 1 tablet by mouth 2 (two) times daily. (02-13-24), Disp: 60 tablet, Rfl: 0   [START ON 01/16/2024] amphetamine -dextroamphetamine  (ADDERALL) 15 MG tablet, Take 1 tablet by mouth 2 (two) times daily. (01-16-24), Disp: 60 tablet, Rfl: 0   amphetamine -dextroamphetamine  (ADDERALL) 15 MG tablet, Take 1 tablet by mouth 2 (two) times daily., Disp: 60 tablet, Rfl: 0   busPIRone  (BUSPAR ) 15 MG tablet, Take 1 tablet (15 mg total) by mouth 2 (two) times daily., Disp: 180 tablet, Rfl: 1   busPIRone  (BUSPAR ) 15 MG tablet, Take 1 tablet (15 mg total) by mouth in the morning and at bedtime., Disp: 180 tablet, Rfl: 1   busPIRone  (BUSPAR ) 15 MG tablet, Take 1 tablet (15 mg total) by mouth 2 (two) times daily., Disp: 180 tablet, Rfl: 1   colestipol (COLESTID) 1 g tablet, Take 1 tablet (1 g total) by mouth 2 (two) times daily., Disp: 30 tablet, Rfl: 2   doxycycline  (VIBRA -TABS) 100 MG tablet, Take 1 tablet (100 mg total) by mouth 2 (two) times daily., Disp: 20 tablet, Rfl: 0   estradiol  (VIVELLE -DOT) 0.1 MG/24HR patch, Place 1 patch (0.1 mg  total) onto the skin 2 (two) times a week., Disp: 24 patch, Rfl: 3   Fremanezumab -vfrm ([name] ) 225 MG/1.5ML SOAJ, Inject 225 mg into the skin every 30 (thirty) days., Disp: 1.5 mL, Rfl: 11   gabapentin  (NEURONTIN ) 300 MG capsule, Take 1 capsule (300 mg total) by mouth 3 (three) times daily., Disp: 90 capsule, Rfl: 5   gabapentin  (NEURONTIN ) 300 MG capsule, Take 1 capsule (300 mg total) by mouth 3 (three) times daily., Disp: 90 capsule, Rfl: 5   lidocaine  (LIDODERM ) 5 %, Place 1 patch onto the skin daily. Keep patch on for 12 hours and remove for 12 hours, Disp: 10 patch, Rfl: 0   metaxalone  (SKELAXIN ) 800 MG tablet, Take 1 tablet (800 mg total) by mouth 3 (three) times daily as needed for muscle spasms., Disp: 30 tablet, Rfl: 1   ondansetron  (ZOFRAN -ODT) 4 MG disintegrating tablet, Take 1 tablet (4 mg total) by mouth  every 8 (eight) hours as needed., Disp: 20 tablet, Rfl: 11   QUEtiapine  (SEROQUEL ) 300 MG tablet, Take 1 tablet (300 mg total) by mouth at bedtime., Disp: 90 tablet, Rfl: 1   QUEtiapine  (SEROQUEL ) 300 MG tablet, Take 1 tablet (300 mg total) by mouth at bedtime., Disp: 90 tablet, Rfl: 1   QUEtiapine  (SEROQUEL ) 300 MG tablet, Take 1 tablet (300 mg total) by mouth at bedtime., Disp: 90 tablet, Rfl: 1   rizatriptan  (MAXALT -MLT) 10 MG disintegrating tablet, Take 1 tablet (10 mg total) by mouth as needed for migraine. May repeat in 2 hours if needed, Disp: 9 tablet, Rfl: 11   sertraline  (ZOLOFT ) 100 MG tablet, Take 1 tablet (100 mg total) by mouth 2 (two) times daily., Disp: 180 tablet, Rfl: 1   sertraline  (ZOLOFT ) 100 MG tablet, Take 1 tablet (100 mg total) by mouth 2 (two) times daily., Disp: 180 tablet, Rfl: 1   sertraline  (ZOLOFT ) 100 MG tablet, Take 1 tablet (100 mg total) by mouth 2 (two) times daily., Disp: 180 tablet, Rfl: 1   zolpidem  (AMBIEN ) 10 MG tablet, Take 1 tablet (10 mg total) by mouth at bedtime., Disp: 8 tablet, Rfl: 0   zolpidem  (AMBIEN ) 10 MG tablet, Take 1 tablet (10 mg total) by mouth at bedtime., Disp: 30 tablet, Rfl: 4  Current Facility-Administered Medications:    0.9 %  sodium chloride  infusion, 500 mL, Intravenous, Once, Danis, Victory CROME III, MD   Fremanezumab -vfrm SOSY 225 mg, 225 mg, Subcutaneous, Once, Ines Onetha NOVAK, MD  Observations/Objective: Patient is well-developed, well-nourished in no acute distress.  Resting comfortably at home.  Head is normocephalic, atraumatic.  No labored breathing.  Speech is clear and coherent with logical content.  Patient is alert and oriented at baseline.  Left conjunctiva at the lacrimal corner is injected; Lateral conjunctiva appears normal; EOM grossly intact; Pupils are round and equal; Patient reports no true vision changes  Assessment and Plan: 1. Bacterial conjunctivitis of left eye (Primary) - neomycin-polymyxin  b-dexamethasone  (MAXITROL) 3.5-10000-0.1 SUSP; Place 1 drop into the left eye every 6 (six) hours for 5 days.  Dispense: 5 mL; Refill: 0  - Suspect bacterial conjunctivitis - Maxitrol prescribed - Warm compresses - Good hand hygiene - Seek in person evaluation if symptoms worsen or fail to improve   Follow Up Instructions: I discussed the assessment and treatment plan with the patient. The patient was provided an opportunity to ask questions and all were answered. The patient agreed with the plan and demonstrated an understanding of the instructions.  A copy of instructions were  sent to the patient via MyChart unless otherwise noted below.    The patient was advised to call back or seek an in-person evaluation if the symptoms worsen or if the condition fails to improve as anticipated.    [name], PA-C

## 2023-12-31 NOTE — Telephone Encounter (Signed)
 FMLA form complete,signed by provider and placed in Medical records box . Pt aware form is complete

## 2023-12-31 NOTE — Patient Instructions (Signed)
 [name], thank you for joining [name], PA-C for today's virtual visit.  While this provider is not your primary care provider (PCP), if your PCP is located in our provider database this encounter information will be shared with them immediately following your visit.   A [facility] MyChart account gives you access to today's visit and all your visits, tests, and labs performed at [facility]  click here if you don't have a [facility] MyChart account or go to [email or web address]  Consent: (Patient) Wendy Maynard provided verbal consent for this virtual visit at the beginning of the encounter.  Current Medications:  Current Outpatient Medications:    neomycin -polymyxin b-dexamethasone  (MAXITROL) 3.5-10000-0.1 SUSP, Place 1 drop into the left eye every 6 (six) hours for 5 days., Disp: 5 mL, Rfl: 0   ALPRAZolam  (XANAX ) 0.5 MG tablet, Take 1 tablet (0.5 mg total) by mouth 2 (two) times daily., Disp: 60 tablet, Rfl: 5   ALPRAZolam  (XANAX ) 0.5 MG tablet, Take 1 tablet (0.5 mg total) by mouth 3 (three) times daily as needed., Disp: 75 tablet, Rfl: 5   amphetamine -dextroamphetamine  (ADDERALL) 15 MG tablet, Take 1 tablet by mouth 2 (two) times daily., Disp: 60 tablet, Rfl: 0   amphetamine -dextroamphetamine  (ADDERALL) 15 MG tablet, Take 1 tablet by mouth 2 (two) times daily., Disp: 60 tablet, Rfl: 0   amphetamine -dextroamphetamine  (ADDERALL) 15 MG tablet, Take 1 tablet by mouth 2 (two) times daily., Disp: 60 tablet, Rfl: 0   amphetamine -dextroamphetamine  (ADDERALL) 15 MG tablet, Take 1 tablet by mouth 2 (two) times daily., Disp: 60 tablet, Rfl: 0   amphetamine -dextroamphetamine  (ADDERALL) 15 MG tablet, Take 1 tablet by mouth 2 (two) times daily., Disp: 60 tablet, Rfl: 0   [START ON 02/13/2024] amphetamine -dextroamphetamine  (ADDERALL) 15 MG tablet, Take 1 tablet by mouth 2 (two) times daily. (02-13-24), Disp: 60 tablet, Rfl: 0   [START ON 01/16/2024] amphetamine -dextroamphetamine   (ADDERALL) 15 MG tablet, Take 1 tablet by mouth 2 (two) times daily. (01-16-24), Disp: 60 tablet, Rfl: 0   amphetamine -dextroamphetamine  (ADDERALL) 15 MG tablet, Take 1 tablet by mouth 2 (two) times daily., Disp: 60 tablet, Rfl: 0   busPIRone  (BUSPAR ) 15 MG tablet, Take 1 tablet (15 mg total) by mouth 2 (two) times daily., Disp: 180 tablet, Rfl: 1   busPIRone  (BUSPAR ) 15 MG tablet, Take 1 tablet (15 mg total) by mouth in the morning and at bedtime., Disp: 180 tablet, Rfl: 1   busPIRone  (BUSPAR ) 15 MG tablet, Take 1 tablet (15 mg total) by mouth 2 (two) times daily., Disp: 180 tablet, Rfl: 1   colestipol  (COLESTID ) 1 g tablet, Take 1 tablet (1 g total) by mouth 2 (two) times daily., Disp: 30 tablet, Rfl: 2   doxycycline  (VIBRA -TABS) 100 MG tablet, Take 1 tablet (100 mg total) by mouth 2 (two) times daily., Disp: 20 tablet, Rfl: 0   estradiol  (VIVELLE -DOT) 0.1 MG/24HR patch, Place 1 patch (0.1 mg total) onto the skin 2 (two) times a week., Disp: 24 patch, Rfl: 3   Fremanezumab -vfrm ([name] ) 225 MG/1.5ML SOAJ, Inject 225 mg into the skin every 30 (thirty) days., Disp: 1.5 mL, Rfl: 11   gabapentin  (NEURONTIN ) 300 MG capsule, Take 1 capsule (300 mg total) by mouth 3 (three) times daily., Disp: 90 capsule, Rfl: 5   gabapentin  (NEURONTIN ) 300 MG capsule, Take 1 capsule (300 mg total) by mouth 3 (three) times daily., Disp: 90 capsule, Rfl: 5   lidocaine  (LIDODERM ) 5 %, Place 1 patch onto the skin daily. Keep patch on for  12 hours and remove for 12 hours, Disp: 10 patch, Rfl: 0   metaxalone  (SKELAXIN ) 800 MG tablet, Take 1 tablet (800 mg total) by mouth 3 (three) times daily as needed for muscle spasms., Disp: 30 tablet, Rfl: 1   ondansetron  (ZOFRAN -ODT) 4 MG disintegrating tablet, Take 1 tablet (4 mg total) by mouth every 8 (eight) hours as needed., Disp: 20 tablet, Rfl: 11   QUEtiapine  (SEROQUEL ) 300 MG tablet, Take 1 tablet (300 mg total) by mouth at bedtime., Disp: 90 tablet, Rfl: 1   QUEtiapine  (SEROQUEL )  300 MG tablet, Take 1 tablet (300 mg total) by mouth at bedtime., Disp: 90 tablet, Rfl: 1   QUEtiapine  (SEROQUEL ) 300 MG tablet, Take 1 tablet (300 mg total) by mouth at bedtime., Disp: 90 tablet, Rfl: 1   rizatriptan  (MAXALT -MLT) 10 MG disintegrating tablet, Take 1 tablet (10 mg total) by mouth as needed for migraine. May repeat in 2 hours if needed, Disp: 9 tablet, Rfl: 11   sertraline  (ZOLOFT ) 100 MG tablet, Take 1 tablet (100 mg total) by mouth 2 (two) times daily., Disp: 180 tablet, Rfl: 1   sertraline  (ZOLOFT ) 100 MG tablet, Take 1 tablet (100 mg total) by mouth 2 (two) times daily., Disp: 180 tablet, Rfl: 1   sertraline  (ZOLOFT ) 100 MG tablet, Take 1 tablet (100 mg total) by mouth 2 (two) times daily., Disp: 180 tablet, Rfl: 1   zolpidem  (AMBIEN ) 10 MG tablet, Take 1 tablet (10 mg total) by mouth at bedtime., Disp: 8 tablet, Rfl: 0   zolpidem  (AMBIEN ) 10 MG tablet, Take 1 tablet (10 mg total) by mouth at bedtime., Disp: 30 tablet, Rfl: 4  Current Facility-Administered Medications:    0.9 %  sodium chloride  infusion, 500 mL, Intravenous, Once, Danis, Victory CROME III, MD   Fremanezumab -vfrm SOSY 225 mg, 225 mg, Subcutaneous, Once, Ahern, Antonia B, MD   Medications ordered in this encounter:  Meds ordered this encounter  Medications   neomycin-polymyxin b-dexamethasone  (MAXITROL) 3.5-10000-0.1 SUSP    Sig: Place 1 drop into the left eye every 6 (six) hours for 5 days.    Dispense:  5 mL    Refill:  0    Supervising Provider:   BLAISE ALEENE KIDD [[id number]]     *If you need refills on other medications prior to your next appointment, please contact your pharmacy*  Follow-Up: Call back or seek an in-person evaluation if the symptoms worsen or if the condition fails to improve as anticipated.  [facility] [phone]  Other Instructions  Bacterial Conjunctivitis, Adult Bacterial conjunctivitis is an infection of the clear membrane that covers the white part of the eye and  the inner surface of the eyelid (conjunctiva). When the blood vessels in the conjunctiva become inflamed, the eye becomes red or pink. The eye often feels irritated or itchy. Bacterial conjunctivitis spreads easily from person to person (is contagious). It also spreads easily from one eye to the other eye. What are the causes? This condition is caused by bacteria. You may get the infection if you come into close contact with: A person who is infected with the bacteria. Items that are contaminated with the bacteria, such as a face towel, contact lens solution, or eye makeup. What increases the risk? You are more likely to develop this condition if: You are exposed to other people who have the infection. You wear contact lenses. You have a sinus infection. You have had a recent eye injury or surgery. You have a weak  body defense system (immune system). You have a medical condition that causes dry eyes. What are the signs or symptoms? Symptoms of this condition include: Thick, yellowish discharge from the eye. This may turn into a crust on the eyelid overnight and cause your eyelids to stick together. Tearing or watery eyes. Itchy eyes. Burning feeling in your eyes. Eye redness. Swollen eyelids. Blurred vision. How is this diagnosed? This condition is diagnosed based on your symptoms and medical history. Your health care provider may also take a sample of discharge from your eye to find the cause of your infection. How is this treated? This condition may be treated with: Antibiotic eye drops or ointment to clear the infection more quickly and prevent the spread of infection to others. Antibiotic medicines taken by mouth (orally) to treat infections that do not respond to drops or ointments or that last longer than 10 days. Cool, wet cloths (cool compresses) placed on the eyes. Artificial tears applied 2-6 times a day. Follow these instructions at home: Medicines Take or apply your  antibiotic medicine as told by your health care provider. Do not stop using the antibiotic, even if your condition improves, unless directed by your health care provider. Take or apply over-the-counter and prescription medicines only as told by your health care provider. Be very careful to avoid touching the edge of your eyelid with the eye-drop bottle or the ointment tube when you apply medicines to the affected eye. This will keep you from spreading the infection to your other eye or to other people. Managing discomfort Gently wipe away any drainage from your eye with a warm, wet washcloth or a cotton ball. Apply a clean, cool compress to your eye for 10-20 minutes, 3-4 times a day. General instructions Do not wear contact lenses until the inflammation is gone and your health care provider says it is safe to wear them again. Ask your health care provider how to sterilize or replace your contact lenses before you use them again. Wear glasses until you can resume wearing contact lenses. Avoid wearing eye makeup until the inflammation is gone. Throw away any old eye cosmetics that may be contaminated. Change or wash your pillowcase every day. Do not share towels or washcloths. This may spread the infection. Wash your hands often with soap and water for at least 20 seconds and especially before touching your face or eyes. Use paper towels to dry your hands. Avoid touching or rubbing your eyes. Do not drive or use heavy machinery if your vision is blurred. Contact a health care provider if: You have a fever. Your symptoms do not get better after 10 days. Get help right away if: You have a fever and your symptoms suddenly get worse. You have severe pain when you move your eye. You have facial pain, redness, or swelling. You have a sudden loss of vision. Summary Bacterial conjunctivitis is an infection of the clear membrane that covers the white part of the eye and the inner surface of the eyelid  (conjunctiva). Bacterial conjunctivitis spreads easily from eye to eye and from person to person (is contagious). Wash your hands often with soap and water for at least 20 seconds and especially before touching your face or eyes. Use paper towels to dry your hands. Take or apply your antibiotic medicine as told by your health care provider. Do not stop using the antibiotic even if your condition improves. Contact a health care provider if you have a fever or if your symptoms  do not get better after 10 days. Get help right away if you have a sudden loss of vision. This information is not intended to replace advice given to you by your health care provider. Make sure you discuss any questions you have with your health care provider. Document Revised: 05/10/2020 Document Reviewed: 05/10/2020 Elsevier Patient Education  2024 [employer]   If you have been instructed to have an in-person evaluation today at a local Urgent Care facility, please use the link below. It will take you to a list of all of our available [facility], including address, phone number and hours of operation. Please do not delay care.  [facility]  If you or a family member do not have a primary care provider, use the link below to schedule a visit and establish care. When you choose a [facility] primary care physician or advanced practice provider, you gain a long-term partner in health. Find a Primary Care Provider  Learn more about [facility] in-office and virtual care options:  - Get Care Now

## 2024-01-01 ENCOUNTER — Telehealth: Payer: Self-pay | Admitting: *Deleted

## 2024-01-01 NOTE — Telephone Encounter (Signed)
 Pt matrix form faxed on 01/01/2024

## 2024-01-14 ENCOUNTER — Other Ambulatory Visit: Payer: Self-pay

## 2024-01-14 ENCOUNTER — Other Ambulatory Visit (HOSPITAL_BASED_OUTPATIENT_CLINIC_OR_DEPARTMENT_OTHER): Payer: Self-pay

## 2024-01-23 ENCOUNTER — Other Ambulatory Visit: Payer: Self-pay

## 2024-01-23 ENCOUNTER — Other Ambulatory Visit (HOSPITAL_BASED_OUTPATIENT_CLINIC_OR_DEPARTMENT_OTHER): Payer: Self-pay

## 2024-01-26 ENCOUNTER — Other Ambulatory Visit: Payer: Self-pay

## 2024-01-26 ENCOUNTER — Other Ambulatory Visit (HOSPITAL_BASED_OUTPATIENT_CLINIC_OR_DEPARTMENT_OTHER): Payer: Self-pay

## 2024-01-27 ENCOUNTER — Other Ambulatory Visit: Payer: Self-pay

## 2024-01-27 ENCOUNTER — Other Ambulatory Visit (HOSPITAL_BASED_OUTPATIENT_CLINIC_OR_DEPARTMENT_OTHER): Payer: Self-pay

## 2024-01-29 ENCOUNTER — Other Ambulatory Visit (HOSPITAL_BASED_OUTPATIENT_CLINIC_OR_DEPARTMENT_OTHER): Payer: Self-pay

## 2024-01-29 ENCOUNTER — Other Ambulatory Visit: Payer: Self-pay

## 2024-02-10 ENCOUNTER — Other Ambulatory Visit: Payer: Self-pay

## 2024-02-10 ENCOUNTER — Other Ambulatory Visit (HOSPITAL_BASED_OUTPATIENT_CLINIC_OR_DEPARTMENT_OTHER): Payer: Self-pay

## 2024-02-10 DIAGNOSIS — K529 Noninfective gastroenteritis and colitis, unspecified: Secondary | ICD-10-CM

## 2024-02-10 MED ORDER — COLESTIPOL HCL 1 G PO TABS
1.0000 g | ORAL_TABLET | Freq: Two times a day (BID) | ORAL | 2 refills | Status: AC
  Filled 2024-02-10: qty 30, 15d supply, fill #0

## 2024-02-11 ENCOUNTER — Other Ambulatory Visit: Payer: Self-pay

## 2024-02-11 ENCOUNTER — Other Ambulatory Visit (HOSPITAL_BASED_OUTPATIENT_CLINIC_OR_DEPARTMENT_OTHER): Payer: Self-pay

## 2024-02-23 ENCOUNTER — Other Ambulatory Visit: Payer: Self-pay

## 2024-02-27 ENCOUNTER — Other Ambulatory Visit (HOSPITAL_BASED_OUTPATIENT_CLINIC_OR_DEPARTMENT_OTHER): Payer: Self-pay

## 2024-02-27 ENCOUNTER — Other Ambulatory Visit (HOSPITAL_COMMUNITY): Payer: Self-pay

## 2024-02-27 MED ORDER — BUSPIRONE HCL 15 MG PO TABS
15.0000 mg | ORAL_TABLET | Freq: Two times a day (BID) | ORAL | 1 refills | Status: AC
  Filled 2024-02-27: qty 180, 90d supply, fill #0

## 2024-02-27 MED ORDER — SERTRALINE HCL 100 MG PO TABS: 100.0000 mg | ORAL_TABLET | Freq: Two times a day (BID) | ORAL | 1 refills | Status: AC

## 2024-02-27 MED ORDER — ESTRADIOL 0.1 MG/24HR TD PTTW
MEDICATED_PATCH | TRANSDERMAL | 0 refills | Status: AC
  Filled 2024-02-27 – 2024-03-09 (×2): qty 24, 84d supply, fill #0

## 2024-02-27 MED ORDER — AMPHETAMINE-DEXTROAMPHETAMINE 15 MG PO TABS: 15.0000 mg | ORAL_TABLET | Freq: Two times a day (BID) | ORAL | 0 refills | Status: AC

## 2024-02-27 MED ORDER — GABAPENTIN 300 MG PO CAPS: 300.0000 mg | ORAL_CAPSULE | Freq: Three times a day (TID) | ORAL | 5 refills | Status: AC

## 2024-02-27 MED ORDER — ZOLPIDEM TARTRATE 10 MG PO TABS
10.0000 mg | ORAL_TABLET | Freq: Every day | ORAL | 5 refills | Status: AC
  Filled 2024-03-10: qty 30, 30d supply, fill #0

## 2024-02-27 MED ORDER — QUETIAPINE FUMARATE 300 MG PO TABS: 300.0000 mg | ORAL_TABLET | Freq: Every day | ORAL | 1 refills | Status: AC

## 2024-03-08 ENCOUNTER — Other Ambulatory Visit (HOSPITAL_COMMUNITY): Payer: Self-pay

## 2024-03-09 ENCOUNTER — Other Ambulatory Visit (HOSPITAL_BASED_OUTPATIENT_CLINIC_OR_DEPARTMENT_OTHER): Payer: Self-pay

## 2024-03-10 ENCOUNTER — Other Ambulatory Visit (HOSPITAL_BASED_OUTPATIENT_CLINIC_OR_DEPARTMENT_OTHER): Payer: Self-pay

## 2024-03-10 ENCOUNTER — Other Ambulatory Visit: Payer: Self-pay

## 2024-03-16 ENCOUNTER — Other Ambulatory Visit (HOSPITAL_BASED_OUTPATIENT_CLINIC_OR_DEPARTMENT_OTHER): Payer: Self-pay

## 2024-03-16 MED ORDER — ALPRAZOLAM 0.5 MG PO TABS: 0.5000 mg | ORAL_TABLET | Freq: Three times a day (TID) | ORAL | 4 refills | Status: AC | PRN

## 2024-06-01 ENCOUNTER — Telehealth: Admitting: Adult Health
